# Patient Record
Sex: Male | Born: 1980 | Race: Black or African American | Hispanic: No | Marital: Single | Smoking: Current every day smoker
Health system: Southern US, Community
[De-identification: ages and names within clinical notes are randomized; demographics above are authoritative.]

## PROBLEM LIST (undated history)

## (undated) DIAGNOSIS — I10 Essential (primary) hypertension: Secondary | ICD-10-CM

---

## 2000-06-16 ENCOUNTER — Emergency Department (HOSPITAL_COMMUNITY): Admission: EM | Admit: 2000-06-16 | Discharge: 2000-06-16 | Payer: Self-pay | Admitting: Emergency Medicine

## 2001-10-16 ENCOUNTER — Emergency Department (HOSPITAL_COMMUNITY): Admission: EM | Admit: 2001-10-16 | Discharge: 2001-10-16 | Payer: Self-pay | Admitting: *Deleted

## 2003-06-03 ENCOUNTER — Emergency Department (HOSPITAL_COMMUNITY): Admission: EM | Admit: 2003-06-03 | Discharge: 2003-06-03 | Payer: Self-pay | Admitting: Emergency Medicine

## 2005-01-09 ENCOUNTER — Emergency Department (HOSPITAL_COMMUNITY): Admission: EM | Admit: 2005-01-09 | Discharge: 2005-01-09 | Payer: Self-pay | Admitting: Family Medicine

## 2006-10-16 ENCOUNTER — Emergency Department (HOSPITAL_COMMUNITY): Admission: EM | Admit: 2006-10-16 | Discharge: 2006-10-16 | Payer: Self-pay | Admitting: Emergency Medicine

## 2007-08-17 ENCOUNTER — Emergency Department (HOSPITAL_COMMUNITY): Admission: EM | Admit: 2007-08-17 | Discharge: 2007-08-17 | Payer: Self-pay | Admitting: Emergency Medicine

## 2008-03-06 ENCOUNTER — Emergency Department (HOSPITAL_COMMUNITY): Admission: EM | Admit: 2008-03-06 | Discharge: 2008-03-06 | Payer: Self-pay | Admitting: Emergency Medicine

## 2009-06-21 ENCOUNTER — Emergency Department (HOSPITAL_COMMUNITY): Admission: EM | Admit: 2009-06-21 | Discharge: 2009-06-21 | Payer: Self-pay | Admitting: Emergency Medicine

## 2010-11-29 ENCOUNTER — Encounter: Payer: Self-pay | Admitting: Family Medicine

## 2011-01-10 ENCOUNTER — Emergency Department (HOSPITAL_COMMUNITY): Payer: Self-pay

## 2011-01-10 ENCOUNTER — Emergency Department (HOSPITAL_COMMUNITY)
Admission: EM | Admit: 2011-01-10 | Discharge: 2011-01-10 | Disposition: A | Discharge status: Home / Self Care | Payer: No Typology Code available for payment source | Attending: Emergency Medicine | Admitting: Emergency Medicine

## 2011-01-10 DIAGNOSIS — S81009A Unspecified open wound, unspecified knee, initial encounter: Secondary | ICD-10-CM | POA: Insufficient documentation

## 2011-01-10 DIAGNOSIS — IMO0002 Reserved for concepts with insufficient information to code with codable children: Secondary | ICD-10-CM | POA: Insufficient documentation

## 2011-01-10 DIAGNOSIS — R51 Headache: Secondary | ICD-10-CM | POA: Insufficient documentation

## 2011-01-10 DIAGNOSIS — S060X0A Concussion without loss of consciousness, initial encounter: Secondary | ICD-10-CM | POA: Insufficient documentation

## 2011-01-21 ENCOUNTER — Emergency Department (HOSPITAL_COMMUNITY): Payer: Self-pay

## 2011-01-21 ENCOUNTER — Emergency Department (HOSPITAL_COMMUNITY)
Admission: EM | Admit: 2011-01-21 | Discharge: 2011-01-21 | Disposition: A | Discharge status: Home / Self Care | Payer: Self-pay | Attending: Emergency Medicine | Admitting: Emergency Medicine

## 2011-01-21 ENCOUNTER — Inpatient Hospital Stay (INDEPENDENT_AMBULATORY_CARE_PROVIDER_SITE_OTHER)
Admission: RE | Admit: 2011-01-21 | Discharge: 2011-01-21 | Disposition: A | Discharge status: Home / Self Care | Payer: No Typology Code available for payment source | Source: Ambulatory Visit

## 2011-01-21 DIAGNOSIS — R059 Cough, unspecified: Secondary | ICD-10-CM | POA: Insufficient documentation

## 2011-01-21 DIAGNOSIS — M7989 Other specified soft tissue disorders: Secondary | ICD-10-CM

## 2011-01-21 DIAGNOSIS — R609 Edema, unspecified: Secondary | ICD-10-CM | POA: Insufficient documentation

## 2011-01-21 DIAGNOSIS — M25476 Effusion, unspecified foot: Secondary | ICD-10-CM | POA: Insufficient documentation

## 2011-01-21 DIAGNOSIS — R0989 Other specified symptoms and signs involving the circulatory and respiratory systems: Secondary | ICD-10-CM | POA: Insufficient documentation

## 2011-01-21 DIAGNOSIS — L02419 Cutaneous abscess of limb, unspecified: Secondary | ICD-10-CM | POA: Insufficient documentation

## 2011-01-21 DIAGNOSIS — M25579 Pain in unspecified ankle and joints of unspecified foot: Secondary | ICD-10-CM | POA: Insufficient documentation

## 2011-01-21 DIAGNOSIS — R05 Cough: Secondary | ICD-10-CM | POA: Insufficient documentation

## 2011-01-21 DIAGNOSIS — M25473 Effusion, unspecified ankle: Secondary | ICD-10-CM | POA: Insufficient documentation

## 2011-01-21 DIAGNOSIS — M79609 Pain in unspecified limb: Secondary | ICD-10-CM | POA: Insufficient documentation

## 2011-02-06 ENCOUNTER — Emergency Department (HOSPITAL_COMMUNITY)
Admission: EM | Admit: 2011-02-06 | Discharge: 2011-02-07 | Disposition: A | Discharge status: Home / Self Care | Payer: No Typology Code available for payment source | Attending: Emergency Medicine | Admitting: Emergency Medicine

## 2011-02-06 DIAGNOSIS — M79609 Pain in unspecified limb: Secondary | ICD-10-CM | POA: Insufficient documentation

## 2011-02-06 DIAGNOSIS — R609 Edema, unspecified: Secondary | ICD-10-CM | POA: Insufficient documentation

## 2011-02-06 DIAGNOSIS — M7989 Other specified soft tissue disorders: Secondary | ICD-10-CM | POA: Insufficient documentation

## 2011-02-06 DIAGNOSIS — L989 Disorder of the skin and subcutaneous tissue, unspecified: Secondary | ICD-10-CM | POA: Insufficient documentation

## 2011-02-07 ENCOUNTER — Ambulatory Visit (HOSPITAL_COMMUNITY)
Admission: RE | Admit: 2011-02-07 | Discharge: 2011-02-07 | Disposition: A | Discharge status: Home / Self Care | Payer: No Typology Code available for payment source | Source: Intra-hospital | Attending: Emergency Medicine | Admitting: Emergency Medicine

## 2011-02-07 DIAGNOSIS — M79609 Pain in unspecified limb: Secondary | ICD-10-CM | POA: Insufficient documentation

## 2011-02-07 DIAGNOSIS — M7989 Other specified soft tissue disorders: Secondary | ICD-10-CM | POA: Insufficient documentation

## 2011-02-07 LAB — DIFFERENTIAL
Basophils Relative: 0 % (ref 0–1)
Eosinophils Absolute: 0.2 10*3/uL (ref 0.0–0.7)
Lymphs Abs: 2.3 10*3/uL (ref 0.7–4.0)
Monocytes Absolute: 0.6 10*3/uL (ref 0.1–1.0)
Monocytes Relative: 10 % (ref 3–12)

## 2011-02-07 LAB — CBC
Hemoglobin: 12.6 g/dL — ABNORMAL LOW (ref 13.0–17.0)
MCH: 30 pg (ref 26.0–34.0)
MCHC: 32.7 g/dL (ref 30.0–36.0)
MCV: 91.7 fL (ref 78.0–100.0)
Platelets: 215 10*3/uL (ref 150–400)

## 2011-02-13 LAB — CBC
HCT: 39.9 % (ref 39.0–52.0)
MCHC: 33.8 g/dL (ref 30.0–36.0)
Platelets: 164 10*3/uL (ref 150–400)
RDW: 15.4 % (ref 11.5–15.5)

## 2011-02-13 LAB — POCT I-STAT, CHEM 8
BUN: 6 mg/dL (ref 6–23)
Calcium, Ion: 1.09 mmol/L — ABNORMAL LOW (ref 1.12–1.32)
HCT: 42 % (ref 39.0–52.0)
Hemoglobin: 14.3 g/dL (ref 13.0–17.0)
Sodium: 138 mEq/L (ref 135–145)
TCO2: 25 mmol/L (ref 0–100)

## 2011-02-13 LAB — DIFFERENTIAL
Basophils Absolute: 0.1 10*3/uL (ref 0.0–0.1)
Basophils Relative: 1 % (ref 0–1)
Eosinophils Relative: 1 % (ref 0–5)
Lymphocytes Relative: 8 % — ABNORMAL LOW (ref 12–46)
Monocytes Absolute: 1 10*3/uL (ref 0.1–1.0)

## 2011-02-13 LAB — RAPID STREP SCREEN (MED CTR MEBANE ONLY): Streptococcus, Group A Screen (Direct): NEGATIVE

## 2011-08-19 LAB — CULTURE, ROUTINE-ABSCESS: Gram Stain: NONE SEEN

## 2011-08-30 ENCOUNTER — Inpatient Hospital Stay (INDEPENDENT_AMBULATORY_CARE_PROVIDER_SITE_OTHER)
Admission: RE | Admit: 2011-08-30 | Discharge: 2011-08-30 | Disposition: A | Discharge status: Home / Self Care | Payer: No Typology Code available for payment source | Source: Ambulatory Visit | Attending: Family Medicine | Admitting: Family Medicine

## 2011-08-30 DIAGNOSIS — M545 Low back pain: Secondary | ICD-10-CM

## 2011-08-30 DIAGNOSIS — R030 Elevated blood-pressure reading, without diagnosis of hypertension: Secondary | ICD-10-CM
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
## 2015-03-19 ENCOUNTER — Encounter (HOSPITAL_COMMUNITY): Payer: Self-pay

## 2015-03-19 ENCOUNTER — Emergency Department (INDEPENDENT_AMBULATORY_CARE_PROVIDER_SITE_OTHER)
Admission: EM | Admit: 2015-03-19 | Discharge: 2015-03-19 | Disposition: A | Discharge status: Home / Self Care | Payer: Self-pay | Source: Home / Self Care | Attending: Family Medicine | Admitting: Family Medicine

## 2015-03-19 DIAGNOSIS — M6740 Ganglion, unspecified site: Secondary | ICD-10-CM

## 2015-03-19 DIAGNOSIS — I10 Essential (primary) hypertension: Secondary | ICD-10-CM

## 2015-03-19 DIAGNOSIS — L739 Follicular disorder, unspecified: Secondary | ICD-10-CM

## 2015-03-19 DIAGNOSIS — R600 Localized edema: Secondary | ICD-10-CM

## 2015-03-19 LAB — POCT I-STAT, CHEM 8
BUN: 17 mg/dL (ref 6–20)
CREATININE: 1 mg/dL (ref 0.61–1.24)
Calcium, Ion: 1.17 mmol/L (ref 1.12–1.23)
Chloride: 105 mmol/L (ref 101–111)
Glucose, Bld: 139 mg/dL — ABNORMAL HIGH (ref 70–99)
HEMATOCRIT: 47 % (ref 39.0–52.0)
HEMOGLOBIN: 16 g/dL (ref 13.0–17.0)
POTASSIUM: 4 mmol/L (ref 3.5–5.1)
SODIUM: 140 mmol/L (ref 135–145)
TCO2: 21 mmol/L (ref 0–100)

## 2015-03-19 MED ORDER — METHYLPREDNISOLONE ACETATE 40 MG/ML IJ SUSP
INTRAMUSCULAR | Status: AC
  Filled 2015-03-19: qty 1

## 2015-03-19 MED ORDER — SULFAMETHOXAZOLE-TRIMETHOPRIM 800-160 MG PO TABS: 2.0000 | ORAL_TABLET | Freq: Two times a day (BID) | ORAL | Status: DC

## 2015-03-19 MED ORDER — HYDROCHLOROTHIAZIDE 25 MG PO TABS: 25.0000 mg | ORAL_TABLET | Freq: Every day | ORAL | Status: DC

## 2015-03-19 MED ORDER — METHYLPREDNISOLONE ACETATE 40 MG/ML IJ SUSP
40.0000 mg | Freq: Once | INTRAMUSCULAR | Status: AC
  Administered 2015-03-19: 40 mg via INTRA_ARTICULAR

## 2015-03-19 NOTE — ED Notes (Signed)
Pain and swelling left wrist, pain & swelling right posterior auricular area, feet painful and swollen

## 2015-03-19 NOTE — ED Provider Notes (Signed)
Gerald Mills is a 34 y.o. male who presents to Urgent Care today for several issues. Patient does not have a doctor or health insurance and recently started a new job. He is stated to get health insurance in the near future.  1) left wrist swelling. Patient notes swelling at the dorsal radial wrist worse with activity and better with rest. No radiating pain weakness or numbness. No injury. 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  2) feet swelling bilaterally present for months. No chest pains or palpitations. The swelling is worse after the end of the day standing. He does note some shortness of breath when he lies flat at night. He feels well otherwise. He notes uncontrolled hypertension as well.  3) pain at the right ear. Patient has pain at the right ear fold posteriorly with chronic drainage and infection. This is been present for months. No treatment tried yet. No fevers or chills.  4) hypertension. Patient has a personal family history of hypertension with no fevers or chills. No chest pains or palpitations.     History reviewed. No pertinent past medical history. History reviewed. No pertinent past surgical history. History  Substance Use Topics  . Smoking status: Current Every Day Smoker  . Smokeless tobacco: Not on file  . Alcohol Use: No   ROS as above Medications: No current facility-administered medications for this encounter.   Current Outpatient Prescriptions  Medication Sig Dispense Refill  . Aspirin-Salicylamide-Caffeine (BC HEADACHE POWDER PO) Take by mouth.    . hydrochlorothiazide (HYDRODIURIL) 25 MG tablet Take 1 tablet (25 mg total) by mouth daily. 30 tablet 1  . sulfamethoxazole-trimethoprim (BACTRIM DS,SEPTRA DS) 800-160 MG per tablet Take 2 tablets by mouth 2 (two) times daily. 28 tablet 0   No Known Allergies   Exam:  BP 164/102 mmHg  Pulse 70  Temp(Src) 98.1 F (36.7 C) (Oral)  Resp 16  SpO2 97% Gen: Well NAD obese HEENT:  EOMI,  MMM right posterior ear fold erythematous and tender. No fluctuance palpated. No JVD Lungs: Normal work of breathing. CTABL Heart: RRR no MRG Abd: NABS, Soft. Nondistended, Nontender Exts: Brisk capillary refill, warm and well perfused.  Left wrist mass at the dorsal radial wrist nontender slightly mobile. Wrist motion is intact grip strength capillary refill and sensation are intact. Feet bilaterally have trace edema. Pulses are intact bilateral feet  Limited musculoskeletal ultrasound of the left dorsal wrist. Fluid-filled cyst approximately 5 mm in depth and 2 cm in length visible consistent with ganglion cyst. Origin seems to be the fourth dorsal wrist compartment.  Aspiration and injection of ganglion cyst:  Consent obtained and timeout performed. Discussed risks of infection damage to nearby structures bleeding and skin hypopigmentation. Skin cleaned with Betadine and cold spray applied and a half a milliliter of lidocaine without epinephrine injected in a wheel overlying the cyst to achieve good anesthesia. 18-gauge needle inserted into the cyst and a vacuum was placed on a 20-milliliter syringe. No gelatinous material was expressible. 25-gauge needle was used to access the cyst and 20 mg of Depo-Medrol and 0.5 mL of lidocaine were injected. Patient tolerated the procedure well   ED ECG REPORT   Date: 03/19/2015  Rate: 62  Rhythm: normal sinus rhythm  QRS Axis: normal  Intervals: normal  ST/T Wave abnormalities: nonspecific T wave changes and Lateral flat T waves  Conduction Disutrbances:none  Narrative Interpretation:   Old EKG Reviewed: none available  I have personally reviewed the EKG tracing and  agree with the computerized printout as noted.    Results for orders placed or performed during the hospital encounter of 03/19/15 (from the past 24 hour(s))  I-STAT, chem 8     Status: Abnormal   Collection Time: 03/19/15  2:32 PM  Result Value Ref Range   Sodium 140  135 - 145 mmol/L   Potassium 4.0 3.5 - 5.1 mmol/L   Chloride 105 101 - 111 mmol/L   BUN 17 6 - 20 mg/dL   Creatinine, Ser 1.00 0.61 - 1.24 mg/dL   Glucose, Bld 139 (H) 70 - 99 mg/dL   Calcium, Ion 1.17 1.12 - 1.23 mmol/L   TCO2 21 0 - 100 mmol/L   Hemoglobin 16.0 13.0 - 17.0 g/dL   HCT 47.0 39.0 - 52.0 %   No results found.  Assessment and Plan: 34 y.o. male with  1) ganglion cyst left hand: Treat with corticosteroid injection and wrist bracing as above. Return as needed 2) pedal edema likely related to diastolic dysfunction or fluid retention. Refer to cardiology for workup of heart. Start hydrochlorothiazide. 3) Folliculitis: Treat with bactrim 4) HTN: HCTZ  Discussed warning signs or symptoms. Please see discharge instructions. Patient expresses understanding.     Gregor Hams, MD 03/19/15 [phone]

## 2015-03-19 NOTE — Discharge Instructions (Signed)
Thank you for coming in today. Start hydrochlorothiazide daily for both foot swelling and high blood pressure. Take the Bactrim for the ear skin infection Follow-up with the heart doctors for your heart Return as needed   Edema Edema is an abnormal buildup of fluids in your bodytissues. Edema is somewhatdependent on gravity to pull the fluid to the lowest place in your body. That makes the condition more common in the legs and thighs (lower extremities). Painless swelling of the feet and ankles is common and becomes more likely as you get older. It is also common in looser tissues, like around your eyes.  When the affected area is squeezed, the fluid may move out of that spot and leave a dent for a few moments. This dent is called pitting.  CAUSES  There are many possible causes of edema. Eating too much salt and being on your feet or sitting for a long time can cause edema in your legs and ankles. Hot weather may make edema worse. Common medical causes of edema include:  Heart failure.  Liver disease.  Kidney disease.  Weak blood vessels in your legs.  Cancer.  An injury.  Pregnancy.  Some medications.  Obesity. SYMPTOMS  Edema is usually painless.Your skin may look swollen or shiny.  DIAGNOSIS  Your health care provider may be able to diagnose edema by asking about your medical history and doing a physical exam. You may need to have tests such as X-rays, an electrocardiogram, or blood tests to check for medical conditions that may cause edema.  TREATMENT  Edema treatment depends on the cause. If you have heart, liver, or kidney disease, you need the treatment appropriate for these conditions. General treatment may include:  Elevation of the affected body part above the level of your heart.  Compression of the affected body part. Pressure from elastic bandages or support stockings squeezes the tissues and forces fluid back into the blood vessels. This keeps fluid from  entering the tissues.  Restriction of fluid and salt intake.  Use of a water pill (diuretic). These medications are appropriate only for some types of edema. They pull fluid out of your body and make you urinate more often. This gets rid of fluid and reduces swelling, but diuretics can have side effects. Only use diuretics as directed by your health care provider. HOME CARE INSTRUCTIONS   Keep the affected body part above the level of your heart when you are lying down.   Do not sit still or stand for prolonged periods.   Do not put anything directly under your knees when lying down.  Do not wear constricting clothing or garters on your upper legs.   Exercise your legs to work the fluid back into your blood vessels. This may help the swelling go down.   Wear elastic bandages or support stockings to reduce ankle swelling as directed by your health care provider.   Eat a low-salt diet to reduce fluid if your health care provider recommends it.   Only take medicines as directed by your health care provider. SEEK MEDICAL CARE IF:   Your edema is not responding to treatment.  You have heart, liver, or kidney disease and notice symptoms of edema.  You have edema in your legs that does not improve after elevating them.   You have sudden and unexplained weight gain. SEEK IMMEDIATE MEDICAL CARE IF:   You develop shortness of breath or chest pain.   You cannot breathe when you lie down.  You develop pain, redness, or warmth in the swollen areas.   You have heart, liver, or kidney disease and suddenly get edema.  You have a fever and your symptoms suddenly get worse. MAKE SURE YOU:   Understand these instructions.  Will watch your condition.  Will get help right away if you are not doing well or get worse. Document Released: 10/25/2005 Document Revised: 03/11/2014 Document Reviewed: 08/17/2013 [facility] Patient Information 2015 [place], [place]. This information is not  intended to replace advice given to you by your health care provider. Make sure you discuss any questions you have with your health care provider.   Hypertension Hypertension, commonly called high blood pressure, is when the force of blood pumping through your arteries is too strong. Your arteries are the blood vessels that carry blood from your heart throughout your body. A blood pressure reading consists of a higher number over a lower number, such as 110/72. The higher number (systolic) is the pressure inside your arteries when your heart pumps. The lower number (diastolic) is the pressure inside your arteries when your heart relaxes. Ideally you want your blood pressure below 120/80. Hypertension forces your heart to work harder to pump blood. Your arteries may become narrow or stiff. Having hypertension puts you at risk for heart disease, stroke, and other problems.  RISK FACTORS Some risk factors for high blood pressure are controllable. Others are not.  Risk factors you cannot control include:   Race. You may be at higher risk if you are African American.  Age. Risk increases with age.  Gender. Men are at higher risk than women before age 49 years. After age 69, women are at higher risk than men. Risk factors you can control include:  Not getting enough exercise or physical activity.  Being overweight.  Getting too much fat, sugar, calories, or salt in your diet.  Drinking too much alcohol. SIGNS AND SYMPTOMS Hypertension does not usually cause signs or symptoms. Extremely high blood pressure (hypertensive crisis) may cause headache, anxiety, shortness of breath, and nosebleed. DIAGNOSIS  To check if you have hypertension, your health care provider will measure your blood pressure while you are seated, with your arm held at the level of your heart. It should be measured at least twice using the same arm. Certain conditions can cause a difference in blood pressure between your right  and left arms. A blood pressure reading that is higher than normal on one occasion does not mean that you need treatment. If one blood pressure reading is high, ask your health care provider about having it checked again. TREATMENT  Treating high blood pressure includes making lifestyle changes and possibly taking medicine. Living a healthy lifestyle can help lower high blood pressure. You may need to change some of your habits. Lifestyle changes may include:  Following the DASH diet. This diet is high in fruits, vegetables, and whole grains. It is low in salt, red meat, and added sugars.  Getting at least 2 hours of brisk physical activity every week.  Losing weight if necessary.  Not smoking.  Limiting alcoholic beverages.  Learning ways to reduce stress. If lifestyle changes are not enough to get your blood pressure under control, your health care provider may prescribe medicine. You may need to take more than one. Work closely with your health care provider to understand the risks and benefits. HOME CARE INSTRUCTIONS  Have your blood pressure rechecked as directed by your health care provider.   Take medicines only as  directed by your health care provider. Follow the directions carefully. Blood pressure medicines must be taken as prescribed. The medicine does not work as well when you skip doses. Skipping doses also puts you at risk for problems.   Do not smoke.   Monitor your blood pressure at home as directed by your health care provider. SEEK MEDICAL CARE IF:   You think you are having a reaction to medicines taken.  You have recurrent headaches or feel dizzy.  You have swelling in your ankles.  You have trouble with your vision. SEEK IMMEDIATE MEDICAL CARE IF:  You develop a severe headache or confusion.  You have unusual weakness, numbness, or feel faint.  You have severe chest or abdominal pain.  You vomit repeatedly.  You have trouble breathing. MAKE  SURE YOU:   Understand these instructions.  Will watch your condition.  Will get help right away if you are not doing well or get worse. Document Released: 10/25/2005 Document Revised: 03/11/2014 Document Reviewed: 08/17/2013 [facility] Patient Information 2015 [place], [place]. This information is not intended to replace advice given to you by your health care provider. Make sure you discuss any questions you have with your health care provider.   Ganglion Cyst A ganglion cyst is a noncancerous, fluid-filled lump that occurs near joints or tendons. The ganglion cyst grows out of a joint or the lining of a tendon. It most often develops in the hand or wrist but can also develop in the shoulder, elbow, hip, knee, ankle, or foot. The round or oval ganglion can be pea sized or larger than a grape. Increased activity may enlarge the size of the cyst because more fluid starts to build up.  CAUSES  It is not completely known what causes a ganglion cyst to grow. However, it may be related to:  Inflammation or irritation around the joint.  An injury.  Repetitive movements or overuse.  Arthritis. SYMPTOMS  A lump most often appears in the hand or wrist, but can occur in other areas of the body. Generally, the lump is painless without other symptoms. However, sometimes pain can be felt during activity or when pressure is applied to the lump. The lump may even be tender to the touch. Tingling, pain, numbness, or muscle weakness can occur if the ganglion cyst presses on a nerve. Your grip may be weak and you may have less movement in your joints.  DIAGNOSIS  Ganglion cysts are most often diagnosed based on a physical exam, noting where the cyst is and how it looks. Your caregiver will feel the lump and may shine a light alongside it. If it is a ganglion, a light often shines through it. Your caregiver may order an X-ray, ultrasound, or MRI to rule out other conditions. TREATMENT  Ganglions usually go  away on their own without treatment. If pain or other symptoms are involved, treatment may be needed. Treatment is also needed if the ganglion limits your movement or if it gets infected. Treatment options include:  Wearing a wrist or finger brace or splint.  Taking anti-inflammatory medicine.  Draining fluid from the lump with a needle (aspiration).  Injecting a steroid into the joint.  Surgery to remove the ganglion cyst and its stalk that is attached to the joint or tendon. However, ganglion cysts can grow back. HOME CARE INSTRUCTIONS   Do not press on the ganglion, poke it with a needle, or hit it with a heavy object. You may rub the lump gently and  often. Sometimes fluid moves out of the cyst.  Only take medicines as directed by your caregiver.  Wear your brace or splint as directed by your caregiver. SEEK MEDICAL CARE IF:   Your ganglion becomes larger or more painful.  You have increased redness, red streaks, or swelling.  You have pus coming from the lump.  You have weakness or numbness in the affected area. MAKE SURE YOU:   Understand these instructions.  Will watch your condition.  Will get help right away if you are not doing well or get worse. Document Released: 10/22/2000 Document Revised: 07/19/2012 Document Reviewed: 12/19/2007 [facility] Patient Information 2015 [place], [place]. This information is not intended to replace advice given to you by your health care provider. Make sure you discuss any questions you have with your health care provider.

## 2015-12-22 ENCOUNTER — Encounter (HOSPITAL_COMMUNITY): Payer: Self-pay | Admitting: *Deleted

## 2015-12-22 ENCOUNTER — Emergency Department (INDEPENDENT_AMBULATORY_CARE_PROVIDER_SITE_OTHER)
Admission: EM | Admit: 2015-12-22 | Discharge: 2015-12-22 | Disposition: A | Discharge status: Home / Self Care | Payer: Self-pay | Source: Home / Self Care | Attending: Emergency Medicine | Admitting: Emergency Medicine

## 2015-12-22 DIAGNOSIS — I10 Essential (primary) hypertension: Secondary | ICD-10-CM

## 2015-12-22 DIAGNOSIS — M67432 Ganglion, left wrist: Secondary | ICD-10-CM

## 2015-12-22 DIAGNOSIS — M25562 Pain in left knee: Secondary | ICD-10-CM

## 2015-12-22 DIAGNOSIS — L0293 Carbuncle, unspecified: Secondary | ICD-10-CM

## 2015-12-22 DIAGNOSIS — R600 Localized edema: Secondary | ICD-10-CM

## 2015-12-22 HISTORY — DX: Essential (primary) hypertension: I10

## 2015-12-22 LAB — POCT URINALYSIS DIP (DEVICE)
BILIRUBIN URINE: NEGATIVE
GLUCOSE, UA: NEGATIVE mg/dL
Ketones, ur: NEGATIVE mg/dL
NITRITE: NEGATIVE
PH: 6.5 (ref 5.0–8.0)
PROTEIN: NEGATIVE mg/dL
Specific Gravity, Urine: 1.02 (ref 1.005–1.030)
Urobilinogen, UA: 0.2 mg/dL (ref 0.0–1.0)

## 2015-12-22 LAB — POCT I-STAT, CHEM 8
BUN: 19 mg/dL (ref 6–20)
CALCIUM ION: 1.16 mmol/L (ref 1.12–1.23)
CHLORIDE: 104 mmol/L (ref 101–111)
Creatinine, Ser: 1 mg/dL (ref 0.61–1.24)
Glucose, Bld: 119 mg/dL — ABNORMAL HIGH (ref 65–99)
HCT: 46 % (ref 39.0–52.0)
Hemoglobin: 15.6 g/dL (ref 13.0–17.0)
Potassium: 3.9 mmol/L (ref 3.5–5.1)
SODIUM: 143 mmol/L (ref 135–145)
TCO2: 29 mmol/L (ref 0–100)

## 2015-12-22 MED ORDER — MELOXICAM 15 MG PO TABS: 15.0000 mg | ORAL_TABLET | Freq: Every day | ORAL | Status: DC

## 2015-12-22 MED ORDER — LISINOPRIL-HYDROCHLOROTHIAZIDE 10-12.5 MG PO TABS: 1.0000 | ORAL_TABLET | Freq: Every day | ORAL | Status: DC

## 2015-12-22 MED ORDER — SULFAMETHOXAZOLE-TRIMETHOPRIM 800-160 MG PO TABS: 1.0000 | ORAL_TABLET | Freq: Two times a day (BID) | ORAL | Status: DC

## 2015-12-22 NOTE — ED Notes (Addendum)
Feet  And  Hands   Are  Swelling          X   1  Year     Also  Has  Knot  l  Wrist    painfull  To  Touch  Boil  Behind  r  Ear

## 2015-12-22 NOTE — ED Provider Notes (Signed)
CSN: [id number]     Arrival date & time 12/22/15  58 History   First MD Initiated Contact with Patient 12/22/15 1734     Chief Complaint  Patient presents with  . Joint Swelling   (Consider location/radiation/quality/duration/timing/severity/associated sxs/prior Treatment) HPI  He is a 35 year old man here for evaluation of edema.  He reports swelling in his hands and feet for the last year, but worse over the last several months. It does get better with elevation. He reports swelling to the point where it causes discomfort. He denies any chest pain or shortness of breath. No palpitations. He does have high blood pressure. He has not been on medication for at least 6 months. He does not have any health insurance.  He also reports a cyst on his left wrist. It can create some discomfort at work. He works in a Personal [employer]. He states someone tried to drain it before, but didn't get anything out of it.  He also reports some pressure in his left knee. No injury or trauma. He is able to walk without difficulty. He states it feels swollen.  Past Medical History  Diagnosis Date  . Hypertension    History reviewed. No pertinent past surgical history. History reviewed. No pertinent family history. Social History  Substance Use Topics  . Smoking status: Current Every Day Smoker  . Smokeless tobacco: None  . Alcohol Use: No    Review of Systems As in history of present illness Allergies  Review of patient's allergies indicates no known allergies.  Home Medications   Prior to Admission medications   Medication Sig Start Date End Date Taking? Authorizing Provider  lisinopril-hydrochlorothiazide (PRINZIDE,ZESTORETIC) 10-12.5 MG tablet Take 1 tablet by mouth daily. 12/22/15   Melony Overly, MD  meloxicam (MOBIC) 15 MG tablet Take 1 tablet (15 mg total) by mouth daily. 12/22/15   Melony Overly, MD  sulfamethoxazole-trimethoprim (BACTRIM DS,SEPTRA DS) 800-160 MG tablet Take 1 tablet by mouth 2  (two) times daily. 12/22/15   Melony Overly, MD   Meds Ordered and Administered this Visit  Medications - No data to display  BP 172/109 mmHg  Pulse 82  Temp(Src) 98.1 F (36.7 C) (Oral)  Resp 16  SpO2 99% No data found.   Physical Exam  Constitutional: He is oriented to person, place, and time. He appears well-developed and well-nourished. No distress.  Cardiovascular: Normal rate.   Pulmonary/Chest: Effort normal.  Musculoskeletal: He exhibits edema (2+ to midshin bilaterally).  Left wrist: He has a dorsal ganglion cyst. There is no erythema. It is minimally tender. Left knee: He does have some swelling of the left knee. No point tenderness. He does appear to have a small joint effusion. No joint laxity.  Neurological: He is alert and oriented to person, place, and time.    ED Course  Procedures (including critical care time)  Labs Review Labs Reviewed  POCT URINALYSIS DIP (DEVICE) - Abnormal; Notable for the following:    Hgb urine dipstick TRACE (*)    Leukocytes, UA SMALL (*)    All other components within normal limits  POCT I-STAT, CHEM 8 - Abnormal; Notable for the following:    Glucose, Bld 119 (*)    All other components within normal limits    Imaging Review No results found.    MDM   1. Essential hypertension   2. Pedal edema   3. Ganglion cyst of wrist, left   4. Recurrent boils   5. Left knee pain  His swelling is likely coming from uncontrolled hypertension. I started him on lisinopril/HCTZ. He is to come back in one week to recheck his blood work and blood pressure. I suspect we will have to increase the dose of this medicine. We'll treat recurring boil with Bactrim. Wrist brace and meloxicam for his ganglion cyst. Meloxicam for the left knee pain.    Melony Overly, MD 12/22/15 [phone]

## 2015-12-22 NOTE — Discharge Instructions (Signed)
Take Bactrim 1 pill twice a day for 7 days to see this will clear up the recurring boils behind your ear. Take meloxicam daily for the next week, then as needed. This is for your knee pain and the wrist. With a wrist brace while you are work for the next week. The swelling in your legs is coming from your blood pressure. Take lisinopril-hydrochlorothiazide daily. Please come back in one week so we can recheck your blood work and your blood pressure.

## 2017-04-04 ENCOUNTER — Ambulatory Visit (HOSPITAL_COMMUNITY)
Admission: EM | Admit: 2017-04-04 | Discharge: 2017-04-04 | Disposition: A | Discharge status: Home / Self Care | Payer: Self-pay | Attending: Family Medicine | Admitting: Family Medicine

## 2017-04-04 ENCOUNTER — Encounter (HOSPITAL_COMMUNITY): Payer: Self-pay | Admitting: Emergency Medicine

## 2017-04-04 DIAGNOSIS — I10 Essential (primary) hypertension: Secondary | ICD-10-CM

## 2017-04-04 DIAGNOSIS — K047 Periapical abscess without sinus: Secondary | ICD-10-CM

## 2017-04-04 DIAGNOSIS — Z202 Contact with and (suspected) exposure to infections with a predominantly sexual mode of transmission: Secondary | ICD-10-CM | POA: Insufficient documentation

## 2017-04-04 DIAGNOSIS — R1030 Lower abdominal pain, unspecified: Secondary | ICD-10-CM

## 2017-04-04 DIAGNOSIS — R369 Urethral discharge, unspecified: Secondary | ICD-10-CM

## 2017-04-04 LAB — POCT URINALYSIS DIP (DEVICE)
Bilirubin Urine: NEGATIVE
GLUCOSE, UA: NEGATIVE mg/dL
Hgb urine dipstick: NEGATIVE
KETONES UR: NEGATIVE mg/dL
LEUKOCYTES UA: NEGATIVE
Nitrite: NEGATIVE
Protein, ur: 100 mg/dL — AB
SPECIFIC GRAVITY, URINE: 1.025 (ref 1.005–1.030)
Urobilinogen, UA: 0.2 mg/dL (ref 0.0–1.0)
pH: 5.5 (ref 5.0–8.0)

## 2017-04-04 MED ORDER — HYDROCHLOROTHIAZIDE 25 MG PO TABS: 25.0000 mg | ORAL_TABLET | Freq: Every day | ORAL | 1 refills | Status: DC

## 2017-04-04 MED ORDER — AZITHROMYCIN 250 MG PO TABS
1000.0000 mg | ORAL_TABLET | Freq: Once | ORAL | Status: AC
  Administered 2017-04-04: 1000 mg via ORAL

## 2017-04-04 MED ORDER — LIDOCAINE HCL (PF) 1 % IJ SOLN
INTRAMUSCULAR | Status: AC
  Filled 2017-04-04: qty 2

## 2017-04-04 MED ORDER — CEFTRIAXONE SODIUM 1 G IJ SOLR
1.0000 g | Freq: Once | INTRAMUSCULAR | Status: AC
  Administered 2017-04-04: 1 g via INTRAMUSCULAR

## 2017-04-04 MED ORDER — CEFTRIAXONE SODIUM 1 G IJ SOLR
INTRAMUSCULAR | Status: AC
  Filled 2017-04-04: qty 10

## 2017-04-04 MED ORDER — AMOXICILLIN 500 MG PO CAPS: 500.0000 mg | ORAL_CAPSULE | Freq: Three times a day (TID) | ORAL | 0 refills | Status: DC

## 2017-04-04 MED ORDER — AZITHROMYCIN 250 MG PO TABS
ORAL_TABLET | ORAL | Status: AC
  Filled 2017-04-04: qty 4

## 2017-04-04 NOTE — ED Provider Notes (Signed)
CSN: [id number]     Arrival date & time 04/04/17  1910 History   First MD Initiated Contact with Patient 04/04/17 1950     Chief Complaint  Patient presents with  . Exposure to STD   (Consider location/radiation/quality/duration/timing/severity/associated sxs/prior Treatment) Here for multiple complaints. He was told by a male that she tested postiive for STI after they had unprotected intercourse approx 3 days ago. C/o penial discharge white and lower abd pain. Denies any n/v/d.  Also c/o rt tooth pain states that he has had several tooth abscess in the past and has not been able to see a dentist. Has not taken anything pta. Also bp elevated and was given an rx for htn but has not been able to afford this. Denies any facial droop, no weakness. Alert x4. asking for a cheaper rx till he can see a pcp      Past Medical History:  Diagnosis Date  . Hypertension    History reviewed. No pertinent surgical history. No family history on file. Social History  Substance Use Topics  . Smoking status: Current Every Day Smoker  . Smokeless tobacco: Not on file  . Alcohol use No    Review of Systems  Constitutional: Negative.   HENT: Positive for dental problem, ear pain and facial swelling.        Rt side tooth pain and swelling   Eyes: Negative.   Respiratory: Negative.   Cardiovascular: Negative.   Gastrointestinal: Positive for abdominal pain.  Genitourinary: Positive for discharge and frequency.  Neurological: Positive for headaches.       Intermit with rt side tooth pain     Allergies  Patient has no known allergies.  Home Medications   Prior to Admission medications   Medication Sig Start Date End Date Taking? Authorizing Provider  amoxicillin (AMOXIL) 500 MG capsule Take 1 capsule (500 mg total) by mouth 3 (three) times daily. 04/04/17   Melanee Left, NP  lisinopril-hydrochlorothiazide (PRINZIDE,ZESTORETIC) 10-12.5 MG tablet Take 1 tablet by mouth daily. 12/22/15    Melony Overly, MD  meloxicam (MOBIC) 15 MG tablet Take 1 tablet (15 mg total) by mouth daily. 12/22/15   Melony Overly, MD  sulfamethoxazole-trimethoprim (BACTRIM DS,SEPTRA DS) 800-160 MG tablet Take 1 tablet by mouth 2 (two) times daily. 12/22/15   Melony Overly, MD   Meds Ordered and Administered this Visit   Medications  cefTRIAXone (ROCEPHIN) injection 1 g (not administered)  azithromycin (ZITHROMAX) tablet 1,000 mg (not administered)    BP (!) 199/104 (BP Location: Right Arm) Comment: large cuff  Pulse 67   Temp 98.1 F (36.7 C) (Oral)   Resp (!) 22   SpO2 100%  No data found.   Physical Exam  Constitutional: He appears well-developed.  HENT:  Right Ear: External ear normal.  Left Ear: External ear normal.  Mouth/Throat: Oropharynx is clear and moist.  Rt side tooth decay   Eyes: Pupils are equal, round, and reactive to light.  Neck: Normal range of motion.  Cardiovascular: Normal rate.   Pulmonary/Chest: Effort normal and breath sounds normal.  Abdominal: Soft.  Lower abd tenderness.   Genitourinary:  Genitourinary Comments: White thick discharge   Neurological: He is alert.  Skin: Skin is warm.    Urgent Care Course     Procedures (including critical care time)  Labs Review Labs Reviewed  POCT URINALYSIS DIP (DEVICE) - Abnormal; Notable for the following:       Result Value   Protein,  ur 100 (*)    All other components within normal limits  URINE CYTOLOGY ANCILLARY ONLY    Imaging Review No results found.        MDM   1. STD exposure   2. Essential hypertension   3. Dental abscess    Educated about blood pressure and the signs of stroke  Discussed the exposure to sti  Will need to see a dental for broken tooth  Discussed multiple different reasons for bp issues  And how to control with diet and exercise.  Reviewed previous chart and rx.     Melanee Left, NP 04/04/17 2012

## 2017-04-04 NOTE — ED Triage Notes (Signed)
Exposure to chlamydia.  No pain with urination.  Patient has a discharge.    Right ear pain/dental pain and headache

## 2017-04-04 NOTE — Discharge Instructions (Signed)
Educated about blood pressure and the signs of stroke  Discussed the exposure to sti  Will need to see a dental for broken tooth

## 2017-04-05 LAB — URINE CYTOLOGY ANCILLARY ONLY
CHLAMYDIA, DNA PROBE: NEGATIVE
NEISSERIA GONORRHEA: NEGATIVE
Trichomonas: NEGATIVE

## 2017-04-09 ENCOUNTER — Encounter (HOSPITAL_COMMUNITY): Payer: Self-pay | Admitting: *Deleted

## 2017-04-09 ENCOUNTER — Ambulatory Visit (HOSPITAL_COMMUNITY)
Admission: EM | Admit: 2017-04-09 | Discharge: 2017-04-09 | Disposition: A | Discharge status: Home / Self Care | Payer: Self-pay | Attending: Internal Medicine | Admitting: Internal Medicine

## 2017-04-09 DIAGNOSIS — I10 Essential (primary) hypertension: Secondary | ICD-10-CM | POA: Insufficient documentation

## 2017-04-09 DIAGNOSIS — F17200 Nicotine dependence, unspecified, uncomplicated: Secondary | ICD-10-CM | POA: Insufficient documentation

## 2017-04-09 DIAGNOSIS — A64 Unspecified sexually transmitted disease: Secondary | ICD-10-CM | POA: Insufficient documentation

## 2017-04-09 NOTE — ED Notes (Signed)
Urine 1 and urine 2 placed in lab

## 2017-04-09 NOTE — ED Triage Notes (Signed)
Pt was seen 5/28; was treated for STD with azithro and ceftriaxone; also taking amoxicillin for dental infection.  States discharge was improving but started worsening again yesterday.  Also started the new HTN med.  C/O HA but denies fevers.

## 2017-04-09 NOTE — ED Provider Notes (Signed)
CSN: [id number]     Arrival date & time 04/09/17  1206 History   None    Chief Complaint  Patient presents with  . Penile Discharge   (Consider location/radiation/quality/duration/timing/severity/associated sxs/prior Treatment) 36y.o. male presents with penile discharge. Patient was seen at this facility for similar complaints on 5.28.18. Patient states that his has lower back pain, lower abdominal and pain and that he has white discharge that he notes after he urinates. Patient denies any urinary pain, testicular pain, or fevers.  Condition is acute in nature. Condition is made better by antibiotics give at this facility temperarily. Condition is made worse by nothing. Patient states that only one time since he was treated for STI for which he was negative did he feel that his symptoms were not presents. Patient describes the symptoms are worse after masturbation. Patient states that he has not had sexual intercourse since he was treated. Patient declines second dose of antibiotics at this time.       Past Medical History:  Diagnosis Date  . Hypertension    History reviewed. No pertinent surgical history. No family history on file. Social History  Substance Use Topics  . Smoking status: Current Every Day Smoker  . Smokeless tobacco: Not on file  . Alcohol use No    Review of Systems  Constitutional: Negative for chills and fever.  HENT: Negative for ear pain and sore throat.   Eyes: Negative for pain and visual disturbance.  Respiratory: Negative for cough and shortness of breath.   Cardiovascular: Negative for chest pain and palpitations.  Gastrointestinal: Positive for abdominal pain ( tightness diffuse lower). Negative for vomiting.  Genitourinary: Positive for discharge ( white). Negative for decreased urine volume, dysuria, hematuria, penile pain, penile swelling, scrotal swelling, testicular pain and urgency.  Musculoskeletal: Negative for arthralgias and back pain ( right  lower back ).  Skin: Negative for color change and rash.  Neurological: Negative for seizures and syncope.  All other systems reviewed and are negative.   Allergies  Patient has no known allergies.  Home Medications   Prior to Admission medications   Medication Sig Start Date End Date Taking? Authorizing Provider  amoxicillin (AMOXIL) 500 MG capsule Take 1 capsule (500 mg total) by mouth 3 (three) times daily. 04/04/17  Yes Morley Kos A, NP  hydrochlorothiazide (HYDRODIURIL) 25 MG tablet Take 1 tablet (25 mg total) by mouth daily. 04/04/17  Yes Morley Kos A, NP  lisinopril-hydrochlorothiazide (PRINZIDE,ZESTORETIC) 10-12.5 MG tablet Take 1 tablet by mouth daily. 12/22/15   Melony Overly, MD  meloxicam (MOBIC) 15 MG tablet Take 1 tablet (15 mg total) by mouth daily. 12/22/15   Melony Overly, MD  sulfamethoxazole-trimethoprim (BACTRIM DS,SEPTRA DS) 800-160 MG tablet Take 1 tablet by mouth 2 (two) times daily. 12/22/15   Melony Overly, MD   Meds Ordered and Administered this Visit  Medications - No data to display  BP (!) 179/110   Pulse 66   Temp 98.3 F (36.8 C) (Oral)   Resp 16   SpO2 100%  No data found.   Physical Exam  Constitutional: He is oriented to person, place, and time. He appears well-developed and well-nourished.  HENT:  Head: Normocephalic.  Neck: Normal range of motion.  Pulmonary/Chest: Effort normal.  Musculoskeletal: Normal range of motion.  Neurological: He is alert and oriented to person, place, and time.  Skin: Skin is dry.  Psychiatric: He has a normal mood and affect.  Nursing note and vitals reviewed.  Urgent Care Course     Procedures (including critical care time)  Labs Review Labs Reviewed  CYTOLOGY, (ORAL, ANAL, URETHRAL) ANCILLARY ONLY    Imaging Review No results found.       MDM   1. STD (male)        Jacqualine Mau, NP 04/09/17 1331

## 2017-04-11 LAB — URINE CYTOLOGY ANCILLARY ONLY
Chlamydia: NEGATIVE
NEISSERIA GONORRHEA: NEGATIVE
TRICH (WINDOWPATH): NEGATIVE

## 2017-04-12 LAB — URINE CYTOLOGY ANCILLARY ONLY
BACTERIAL VAGINITIS: NEGATIVE
CANDIDA VAGINITIS: NEGATIVE

## 2017-04-20 ENCOUNTER — Emergency Department (HOSPITAL_COMMUNITY)
Admission: EM | Admit: 2017-04-20 | Discharge: 2017-04-20 | Disposition: A | Discharge status: Home / Self Care | Payer: Self-pay | Attending: Emergency Medicine | Admitting: Emergency Medicine

## 2017-04-20 ENCOUNTER — Encounter (HOSPITAL_COMMUNITY): Payer: Self-pay | Admitting: *Deleted

## 2017-04-20 DIAGNOSIS — Z202 Contact with and (suspected) exposure to infections with a predominantly sexual mode of transmission: Secondary | ICD-10-CM | POA: Insufficient documentation

## 2017-04-20 DIAGNOSIS — M545 Low back pain, unspecified: Secondary | ICD-10-CM

## 2017-04-20 DIAGNOSIS — I10 Essential (primary) hypertension: Secondary | ICD-10-CM | POA: Insufficient documentation

## 2017-04-20 MED ORDER — AZITHROMYCIN 250 MG PO TABS
1000.0000 mg | ORAL_TABLET | Freq: Once | ORAL | Status: AC
  Administered 2017-04-20: 1000 mg via ORAL
  Filled 2017-04-20: qty 4

## 2017-04-20 MED ORDER — CEFTRIAXONE SODIUM 250 MG IJ SOLR
250.0000 mg | Freq: Once | INTRAMUSCULAR | Status: AC
  Administered 2017-04-20: 250 mg via INTRAMUSCULAR
  Filled 2017-04-20: qty 250

## 2017-04-20 MED ORDER — DICLOFENAC SODIUM 1 % TD GEL: 2.0000 g | Freq: Four times a day (QID) | TRANSDERMAL | 0 refills | Status: DC

## 2017-04-20 MED ORDER — LIDOCAINE HCL (PF) 1 % IJ SOLN
2.0000 mL | Freq: Once | INTRAMUSCULAR | Status: AC
  Administered 2017-04-20: 1.8 mL
  Filled 2017-04-20: qty 5

## 2017-04-20 MED ORDER — STERILE WATER FOR INJECTION IJ SOLN
0.9000 mL | Freq: Once | INTRAMUSCULAR | Status: DC
  Filled 2017-04-20: qty 10

## 2017-04-20 NOTE — ED Provider Notes (Signed)
[facility] DEPT Provider Note   CSN: [id number] Arrival date & time: 04/20/17  1438     History   Chief Complaint Chief Complaint  Patient presents with  . Back Pain  . SEXUALLY TRANSMITTED DISEASE    HPI Gerald Mills is a 36 y.o. male with a history of hypertension and STD exposure who presents to the emergency department for penile discharge and low back pain.  Patient was seen on 04/04/2017 for STD exposure where he was treated with Rocephin and Zithromax in office. The patient admits to having sexual intercourse that was unprotected, last week with the same person that gave him the STD originally. He does not think she has been treated.  Over the last week he has been been having a clear/white discharge from his penis. He denies any penile pain, scrotal pain, testicular pain, lesions, rash, itching, abdominal pain, nausea, vomiting, diarrhea.   Patient also notes over the last 4 days she has developed gradual onset of right lower back pain. 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  He states it is a dull ache is rated 4/10 that is only felt if he bends down or twists to his side. He is not currently resting comfortably and is not in any back pain. He has been taking BC powder for this with relief. The patient denies fever, chills, trauma, numbness/tingling/weakness of the lower extremities, saddle anesthesia, loss of bowel or bladder function, urinary retention, use of IV drugs, or skin changes.  HPI  Past Medical History:  Diagnosis Date  . Hypertension     There are no active problems to display for this patient.   History reviewed. No pertinent surgical history.     Home Medications    Prior to Admission medications   Medication Sig Start Date End Date Taking? Authorizing Provider  amoxicillin (AMOXIL) 500 MG capsule Take 1 capsule (500 mg total) by mouth 3 (three) times daily. 04/04/17    Melanee Left, NP  diclofenac sodium (VOLTAREN) 1 % GEL Apply 2 g topically 4 (four) times daily. 04/20/17   Donavon Kimrey, Barth Kirks, PA-C  hydrochlorothiazide (HYDRODIURIL) 25 MG tablet Take 1 tablet (25 mg total) by mouth daily. 04/04/17   Melanee Left, NP  lisinopril-hydrochlorothiazide (PRINZIDE,ZESTORETIC) 10-12.5 MG tablet Take 1 tablet by mouth daily. 12/22/15   Melony Overly, MD  meloxicam (MOBIC) 15 MG tablet Take 1 tablet (15 mg total) by mouth daily. 12/22/15   Melony Overly, MD  sulfamethoxazole-trimethoprim (BACTRIM DS,SEPTRA DS) 800-160 MG tablet Take 1 tablet by mouth 2 (two) times daily. 12/22/15   Melony Overly, MD    Family History History reviewed. No pertinent family history.  Social History Social History  Substance Use Topics  . Smoking status: Current Every Day Smoker  . Smokeless tobacco: Not on file  . Alcohol use No     Allergies   Patient has no known allergies.   Review of Systems Review of Systems  All other systems reviewed and are negative.    Physical Exam Updated Vital Signs BP (!) 177/115 (BP Location: Right Arm)   Pulse 98   Temp 98 F (36.7 C) (Oral)   Resp 16   SpO2 100%   Physical Exam  Constitutional: He appears well-developed and well-nourished.  Overweight male. No acute distess. Non-toxic.   HENT:  Head: Normocephalic and atraumatic.  Right Ear: External ear normal.  Left Ear: External ear normal.  Nose: Nose normal.  Eyes: Conjunctivae are normal. Right eye exhibits no discharge. Left eye exhibits no discharge. No scleral icterus.  Pulmonary/Chest: Effort normal. No respiratory distress.  Abdominal: Soft. There is no tenderness. There is no CVA tenderness. Hernia confirmed negative in the right inguinal area and confirmed negative in the left inguinal area.  Genitourinary: Testes normal and penis normal. Cremasteric reflex is present. Right testis shows no mass, no swelling and no tenderness. Left testis shows no mass, no  swelling and no tenderness. Circumcised. No phimosis, paraphimosis, hypospadias, penile erythema or penile tenderness. No discharge found.  Musculoskeletal:       Right hip: Normal.       Left hip: Normal.       Cervical back: Normal.       Thoracic back: Normal.       Lumbar back: He exhibits tenderness. He exhibits normal range of motion.  Low back: range of motion full and intact. He notes pain when performing flexion and extension. He notes tenderness over the right lower back. No bony tenderness. Gait able. Deep tendon reflexes 2+ patellar. Sensation intact to light touch distally. Neurovascularly intact distally.   Lymphadenopathy: No inguinal adenopathy noted on the right or left side.  Neurological: He is alert. He has normal strength. No sensory deficit. Gait normal.  Skin: Skin is warm and dry. No rash noted. No pallor.  Psychiatric: He has a normal mood and affect.  Nursing note and vitals reviewed.    ED Treatments / Results  Labs (all labs ordered are listed, but only abnormal results are displayed) Labs Reviewed  GC/CHLAMYDIA PROBE AMP ([place]) NOT AT [facility]    EKG  EKG Interpretation None       Radiology No results found.  Procedures Procedures (including critical care time)  Medications Ordered in ED Medications  cefTRIAXone (ROCEPHIN) injection 250 mg (250 mg Intramuscular Given 04/20/17 1658)  azithromycin (ZITHROMAX) tablet 1,000 mg (1,000 mg Oral Given 04/20/17 1629)  lidocaine (PF) (XYLOCAINE) 1 % injection 2 mL (1.8 mLs Other Given 04/20/17 1659)     Initial Impression / Assessment and Plan / ED Course  I have reviewed the triage vital signs and the nursing notes.  Pertinent labs & imaging results that were available during my care of the patient were reviewed by me and considered in my medical decision making (see chart for details).     This is a 36 year old male who presents today for repeat STD exposure. I will prophylactically treat the  patient with ceftriaxone and azithromycin. We'll obtain a gonorrhea and chlamydia screen. I offered Syphilis and HIV testing for the patient but he denies at this time. Advised the patient to follow-up with the health department. Also advised the abstain from sexual intercourse till all partners are treated and seen at the health department.   Suspect lower back pain is due to muscular skeletal in nature. There is no history of trauma or red flag symptoms. He is non-toxic appearing on presentation without a fever. There is tenderness on exam to the right lower back. He has full ROM and nuerovascularly intact distally. The patient has normal and able gait. No imaging warranted at this time. Will give the patient Voltaren gel, and back exercises. Advised the patient he can take Motrin and Tylenol alternately for pain.  Advised the patient to follow with PCP in the next 24 hours for persistent symptoms and also to discuss elevated blood pressure that  was present during today's visit. Patient states understanding and is in agreement with plan. Return precatuations given. All questions answered.  Final Clinical Impressions(s) / ED Diagnoses   Final diagnoses:  Acute right-sided low back pain without sciatica  STD exposure  Essential hypertension    New Prescriptions Discharge Medication List as of 04/20/2017  4:15 PM    START taking these medications   Details  diclofenac sodium (VOLTAREN) 1 % GEL Apply 2 g topically 4 (four) times daily., Starting Wed 04/20/2017, Print         Jillyn Ledger, PA-C 04/20/17 Wanda, Nathan, MD 04/21/17 [phone]

## 2017-04-20 NOTE — ED Triage Notes (Signed)
Pt reports lower back pain and scrotum pain, possible penile discharge. Was exposed to std and requests to be checked.

## 2017-04-20 NOTE — Discharge Instructions (Signed)
I have treated you for your STD exposure. Your results of your STD testing will not back today. Please call the health department for follow-up. The number is attached. Please advise your partners you are in sexual contact with to be tested. Do not have sexual contact until you or they are tested.   I am giving you a gel for your back pain. You can apply this up to 4x/daily. For pain control you may take:  800mg  of ibuprofen (that is usually 4 over the counter pills)  3 times a day (take with food) and acetaminophen 975mg  (this is 3 over the counter pills) four times a day. Do not drink alcohol or combine with other medications that have acetaminophen as an ingredient (Read the labels!).  Please do not exceed this regimen >1 week. Do not combine with BC powder. I am giving you back exercises to do at home.  Please follow up with your PCP. If you don't have a PCP you can call the number below. Please discuss you blood pressure during this visit as well as your blood pressure was elevated during todays visit. Please discuss persistent symptoms with your PCP.   You can return to the emergency department for worsening or new concerning symptoms.     If you have no primary doctor, here are some resources that may be helpful:  [place] Providers:   - [facility]- [address]      [phone]      Mon-Fri 9am-7pm, Sat 9am-1pm   - [facility], [place]      [place], [address] [place]      [facility]- [address]      [place], [address]      [phone]      Only accepts [place] [facility] [place] patients       after they have her name applied to their card   Self Pay (no insurance) in [place]:   - Sickle Cell Patients: Dr Kevan Ny, [facility]      [place]      [place]   - Physician Referral Service- [phone]   - [facility]- [facility]- [address], [facility]- see information above      (Speak to [employer] if you do not have insurance)   - [facility]- [place]      [place]      [phone]   - [facility]- [address]      [phone]   - Dr [facility] Lawman-  [address], [address], [facility]- [address]      [phone]   - [facility]- [address]      [phone]      Also [address]      [phone]   - [facility]- [address]  [phone]      1st and 3rd Saturday every month, 10am-1pm Other agencies that provide inexpensive medical care:    [facility]  [phone]    [facility]  [phone]    [facility]  [phone]    [facility]  [phone]    [facility]  [phone]  General Information: Finding a doctor when you do not have health insurance can be tricky. Although you are not limited by an insurance plan, you are of course limited by her finances and how much but he can pay out of pocket.  What are your options if you don't have health insurance?   1) Find a Tax adviser and Pay Out of Pocket Although you won't have to find out who is covered by your insurance plan, it is a good idea to ask around and get recommendations. You will then need to call the office and see if the doctor you have chosen will accept you as a new patient and what types of options they offer for patients who are self-pay. Some doctors offer discounts or will set up payment plans for their patients who do not have insurance, but you will need to ask so you aren't surprised when you get to your  appointment.  2) Contact Your Local Health Department Not all health departments have doctors that can see patients for sick visits, but many do, so it is worth a call to see if yours does. If you don't know where your local health department is, you can check in your phone book. The CDC also has a tool to help you locate your state's health department, and many state websites also have listings of all of their local health departments.  3) Find a [facility] If your illness is not likely to be very severe or complicated, you may want to try a walk in clinic. These are popping up all over the country in pharmacies, drugstores, and shopping centers. They're usually staffed by nurse practitioners or physician assistants that have been trained to treat common illnesses and complaints. They're usually fairly quick and inexpensive. However, if you have serious medical issues or chronic medical problems, these are probably not your best option

## 2017-06-15 ENCOUNTER — Encounter (HOSPITAL_COMMUNITY): Payer: Self-pay | Admitting: Family Medicine

## 2017-06-15 ENCOUNTER — Ambulatory Visit (HOSPITAL_COMMUNITY)
Admission: EM | Admit: 2017-06-15 | Discharge: 2017-06-15 | Disposition: A | Discharge status: Home / Self Care | Payer: Self-pay | Attending: Family Medicine | Admitting: Family Medicine

## 2017-06-15 DIAGNOSIS — K0889 Other specified disorders of teeth and supporting structures: Secondary | ICD-10-CM

## 2017-06-15 DIAGNOSIS — I10 Essential (primary) hypertension: Secondary | ICD-10-CM

## 2017-06-15 MED ORDER — AMOXICILLIN-POT CLAVULANATE 875-125 MG PO TABS: 1.0000 | ORAL_TABLET | Freq: Two times a day (BID) | ORAL | 0 refills | Status: DC

## 2017-06-15 MED ORDER — LISINOPRIL-HYDROCHLOROTHIAZIDE 10-12.5 MG PO TABS: 1.0000 | ORAL_TABLET | Freq: Every day | ORAL | 2 refills | Status: DC

## 2017-06-15 NOTE — ED Triage Notes (Signed)
Pt here for right jaw pain, ear pain, mouth pain. sts that he also had an abscess behind right ear.

## 2017-06-18 NOTE — ED Provider Notes (Signed)
  [facility]   [id number] 06/15/17 Arrival Time: 4142  ASSESSMENT & PLAN:  1. Pain, dental   2. Essential hypertension     Meds ordered this encounter  Medications  . amoxicillin-clavulanate (AUGMENTIN) 875-125 MG tablet    Sig: Take 1 tablet by mouth every 12 (twelve) hours.    Dispense:  14 tablet    Refill:  0  . lisinopril-hydrochlorothiazide (PRINZIDE,ZESTORETIC) 10-12.5 MG tablet    Sig: Take 1 tablet by mouth daily.    Dispense:  30 tablet    Refill:  2   Will start Augmentin and f/u with dentist. Also begin HTN treatment. Plans to est care with PCP. Reviewed expectations re: course of current medical issues. Questions answered. Outlined signs and symptoms indicating need for more acute intervention. Patient verbalized understanding. After Visit Summary given.   SUBJECTIVE:  Gerald Mills is a 36 y.o. male who presents with complaint of R-sided dental pain radiating to his R ear for a few days. No hearing changes. Afebrile. Similar pain in same location in the past. Cannot remember how long ago. Tolerating PO intake but chewing on R makes pain worse. No dental care sought. No drainage from gums. OTC analgesics with mild help.  Notice increased BP in office.  ROS: As per HPI.   OBJECTIVE:  Vitals:   06/15/17 1801  BP: (!) 188/120  Pulse: 62  Resp: 18  Temp: 98.6 F (37 C)  TempSrc: Oral  SpO2: 100%     General appearance: alert; no distress HEENT: overall poor dentition; R-sided upper gum tenderness; no fluctuance Neck: supple Skin: warm and dry Psychological:  alert and cooperative; normal mood and affect  No Known Allergies  PMHx, SurgHx, SocialHx, Medications, and Allergies were reviewed in the Visit Navigator and updated as appropriate.      Vanessa Kick, MD 06/18/17 1151

## 2017-10-29 ENCOUNTER — Other Ambulatory Visit: Payer: Self-pay

## 2017-10-29 ENCOUNTER — Encounter (HOSPITAL_COMMUNITY): Payer: Self-pay | Admitting: Emergency Medicine

## 2017-10-29 ENCOUNTER — Ambulatory Visit (HOSPITAL_COMMUNITY)
Admission: EM | Admit: 2017-10-29 | Discharge: 2017-10-29 | Disposition: A | Discharge status: Home / Self Care | Payer: Self-pay | Attending: Family Medicine | Admitting: Family Medicine

## 2017-10-29 DIAGNOSIS — H6011 Cellulitis of right external ear: Secondary | ICD-10-CM

## 2017-10-29 DIAGNOSIS — F410 Panic disorder [episodic paroxysmal anxiety] without agoraphobia: Secondary | ICD-10-CM

## 2017-10-29 MED ORDER — DOXYCYCLINE HYCLATE 100 MG PO TABS: 100.0000 mg | ORAL_TABLET | Freq: Two times a day (BID) | ORAL | 3 refills | Status: DC

## 2017-10-29 MED ORDER — ESCITALOPRAM OXALATE 10 MG PO TABS: 10.0000 mg | ORAL_TABLET | Freq: Every day | ORAL | 3 refills | Status: DC

## 2017-10-29 NOTE — Discharge Instructions (Signed)
Continue to monitor blood pressure.  See your primary care doctor if it remains elevated (above 14/90)  Consult a psychologist like Dr. Hulen Skains for the panic disorder

## 2017-10-29 NOTE — ED Provider Notes (Signed)
[place]   [id number] 10/29/17 Arrival Time: 1416   SUBJECTIVE:  Gerald Mills is a 36 y.o. male who presents to the urgent care with complaint of abscess behind his right ear that has been reappearing over the last month.  This is a recurring problem.  Assoc mild acne  He also reports panic attacks over the last few months.  He reports breaking out in a sweat and feeling like he wants "to jump outside of his body."  Recent new job in a warehouse.  Has had some high blood pressure recordings.  Patient is a bit of a "recluse."  Gets nervous around others.  Taking BC's for headaches.    Past Medical History:  Diagnosis Date  . Hypertension    History reviewed. No pertinent family history. Social History   Socioeconomic History  . Marital status: Single    Spouse name: Not on file  . Number of children: Not on file  . Years of education: Not on file  . Highest education level: Not on file  Social Needs  . Financial resource strain: Not on file  . Food insecurity - worry: Not on file  . Food insecurity - inability: Not on file  . Transportation needs - medical: Not on file  . Transportation needs - non-medical: Not on file  Occupational History  . Not on file  Tobacco Use  . Smoking status: Current Every Day Smoker    Packs/day: 0.50    Types: Cigarettes  . Smokeless tobacco: Never Used  Substance and Sexual Activity  . Alcohol use: No  . Drug use: No  . Sexual activity: Not on file  Other Topics Concern  . Not on file  Social History Narrative  . Not on file   No outpatient medications have been marked as taking for the 10/29/17 encounter [facility] Encounter).   No Known Allergies    ROS: As per HPI, remainder of ROS negative.   OBJECTIVE:   Vitals:   10/29/17 1450  BP: (!) 189/104  Pulse: 65  Temp: 97.7 F (36.5 C)  TempSrc: Oral  SpO2: 99%     General appearance: alert; no distress Eyes: PERRL; EOMI; conjunctiva normal HENT:  normocephalic; atraumatic; TMs normal, canal normal, external ears mild right ear lobe erythema, swelling and tenderness without trauma; nasal mucosa normal; oral mucosa normal Neck: supple Back: no CVA tenderness Extremities: no cyanosis or edema; symmetrical with no gross deformities Skin: warm and dry Neurologic: normal gait; grossly normal Psychological: alert and cooperative; normal mood and affect      Labs:  Results for orders placed or performed during the hospital encounter of 04/09/17  Urine cytology ancillary only  Result Value Ref Range   Chlamydia Negative    Neisseria gonorrhea Negative    Trichomonas Negative   Urine cytology ancillary only  Result Value Ref Range   Bacterial vaginitis Negative for Bacterial Vaginitis Microorganisms    Candida vaginitis Negative for Candida Vaginitis Microorganisms     Labs Reviewed - No data to display  No results found.     ASSESSMENT & PLAN:  1. Cellulitis of right earlobe   2. Panic disorder     Meds ordered this encounter  Medications  . doxycycline (VIBRA-TABS) 100 MG tablet    Sig: Take 1 tablet (100 mg total) by mouth 2 (two) times daily.    Dispense:  14 tablet    Refill:  3  . escitalopram (LEXAPRO) 10 MG tablet  Sig: Take 1 tablet (10 mg total) by mouth daily.    Dispense:  90 tablet    Refill:  3    Reviewed expectations re: course of current medical issues. Questions answered. Outlined signs and symptoms indicating need for more acute intervention. Patient verbalized understanding. After Visit Summary given.    Procedures:      Robyn Haber, MD 10/29/17 [phone]

## 2017-10-29 NOTE — ED Triage Notes (Signed)
Pt reports an abscess behind his right ear that has been reappearing over the last month.  He also reports panic attacks over the last few months.  He reports breaking out in a sweat and feeling like he wants "to jump outside of his body."

## 2017-11-17 ENCOUNTER — Ambulatory Visit (INDEPENDENT_AMBULATORY_CARE_PROVIDER_SITE_OTHER): Payer: Self-pay | Admitting: Physician Assistant

## 2018-02-27 ENCOUNTER — Ambulatory Visit (HOSPITAL_COMMUNITY)
Admission: EM | Admit: 2018-02-27 | Discharge: 2018-02-27 | Disposition: A | Discharge status: Home / Self Care | Payer: Self-pay | Attending: Family Medicine | Admitting: Family Medicine

## 2018-02-27 ENCOUNTER — Encounter (HOSPITAL_COMMUNITY): Payer: Self-pay | Admitting: Family Medicine

## 2018-02-27 DIAGNOSIS — L700 Acne vulgaris: Secondary | ICD-10-CM

## 2018-02-27 DIAGNOSIS — L97929 Non-pressure chronic ulcer of unspecified part of left lower leg with unspecified severity: Secondary | ICD-10-CM

## 2018-02-27 DIAGNOSIS — L97919 Non-pressure chronic ulcer of unspecified part of right lower leg with unspecified severity: Secondary | ICD-10-CM

## 2018-02-27 DIAGNOSIS — Z760 Encounter for issue of repeat prescription: Secondary | ICD-10-CM

## 2018-02-27 DIAGNOSIS — L97921 Non-pressure chronic ulcer of unspecified part of left lower leg limited to breakdown of skin: Secondary | ICD-10-CM

## 2018-02-27 DIAGNOSIS — I10 Essential (primary) hypertension: Secondary | ICD-10-CM

## 2018-02-27 MED ORDER — BENZOYL PEROXIDE 5 % EX LIQD: Freq: Two times a day (BID) | CUTANEOUS | 1 refills | Status: DC

## 2018-02-27 MED ORDER — LISINOPRIL-HYDROCHLOROTHIAZIDE 10-12.5 MG PO TABS: 1.0000 | ORAL_TABLET | Freq: Every day | ORAL | 1 refills | Status: DC

## 2018-02-27 NOTE — ED Triage Notes (Signed)
Pt here for med refill on HTN meds. He also has ulcers in legs that wont heal. They flare up and then heal but always return.

## 2018-02-27 NOTE — Discharge Instructions (Addendum)
Take lisinopril-HCTZ as prescribed Use topical medication as needed for mild-moderate acne Referral to wound clinic for further evaluation and treatment of lower leg ulcers Establish care with PCP for further evaluation and treatment of hypertension Return here or go to ER if you have any new or worsening symptoms such as headache, dizziness, blurred vision, chest pain, shortness of breath, difficulty breathing, changes in bowel or bladder habits, etc..Marland Kitchen

## 2018-02-27 NOTE — ED Provider Notes (Signed)
[name]    CSN: [id number] Arrival date & time: 02/27/18  1344     History   Chief Complaint Chief Complaint  Patient presents with  . Medication Refill    HPI Gerald Mills is a 37 y.o. male.   Patient complains of recurring "holes in legs."  It began about year ago.  States he begn after hitting his shins on planks at work.  He localizes his wounds to bilateral shinsright is worse than left.  He describes them as painful and weeping.  He has tried peroxide and neosporin with temporary improvement.  He denies aggravating factors.  He reports similar symptoms in the past that improved with.    Patient is also here for lisinopril HCTZ refill.  He reports a history of HTN.  Blood pressure elevated in office today.  He reports mild headache associated with elevated blood pressure.  Denies chest pain, SOB, dizziness, vision changes, changes in bowel or bladder habits.       Past Medical History:  Diagnosis Date  . Hypertension     There are no active problems to display for this patient.   History reviewed. No pertinent surgical history.     Home Medications    Prior to Admission medications   Medication Sig Start Date End Date Taking? Authorizing Provider  benzoyl peroxide 5 % external liquid Apply topically 2 (two) times daily. 02/27/18   Faiz Weber, [place], PA-C  diclofenac sodium (VOLTAREN) 1 % GEL Apply 2 g topically 4 (four) times daily. 04/20/17   Maczis, Barth Kirks, PA-C  doxycycline (VIBRA-TABS) 100 MG tablet Take 1 tablet (100 mg total) by mouth 2 (two) times daily. 10/29/17   Robyn Haber, MD  escitalopram (LEXAPRO) 10 MG tablet Take 1 tablet (10 mg total) by mouth daily. 10/29/17   Robyn Haber, MD  hydrochlorothiazide (HYDRODIURIL) 25 MG tablet Take 1 tablet (25 mg total) by mouth daily. 04/04/17   Marney Setting, NP  lisinopril-hydrochlorothiazide (PRINZIDE,ZESTORETIC) 10-12.5 MG tablet Take 1 tablet by mouth daily. 02/27/18   Lestine Box, PA-C    Family History History reviewed. No pertinent family history.  Social History Social History   Tobacco Use  . Smoking status: Current Every Day Smoker    Packs/day: 0.50    Types: Cigarettes  . Smokeless tobacco: Never Used  Substance Use Topics  . Alcohol use: No  . Drug use: No     Allergies   Patient has no known allergies.   Review of Systems Review of Systems  Constitutional: Negative for chills, fatigue and fever.  HENT: Negative for congestion, sinus pressure, sinus pain, sneezing and sore throat.   Eyes: Negative for visual disturbance.  Respiratory: Negative for cough and shortness of breath.   Cardiovascular: Negative for chest pain.  Gastrointestinal: Negative for abdominal pain, constipation, diarrhea, nausea and vomiting.  Genitourinary: Negative for difficulty urinating.  Skin: Positive for wound.  Neurological: Positive for dizziness. Negative for headaches.     Physical Exam Triage Vital Signs ED Triage Vitals  Enc Vitals Group     BP 02/27/18 1429 (!) 189/110     Pulse Rate 02/27/18 1429 (!) 58     Resp 02/27/18 1429 18     Temp 02/27/18 1429 98.1 F (36.7 C)     Temp src --      SpO2 02/27/18 1429 100 %     Weight --      Height --      Head Circumference --  Peak Flow --      Pain Score 02/27/18 1427 4     Pain Loc --      Pain Edu? --      Excl. in [place]? --    No data found.  Updated Vital Signs BP (!) 189/110   Pulse (!) 58   Temp 98.1 F (36.7 C)   Resp 18   SpO2 100%   Visual Acuity Right Eye Distance:   Left Eye Distance:   Bilateral Distance:    Right Eye Near:   Left Eye Near:    Bilateral Near:     Physical Exam  Constitutional: He is oriented to person, place, and time. He appears well-developed and well-nourished. No distress.  HENT:  Head: Normocephalic and atraumatic.  Right Ear: External ear normal.  Left Ear: External ear normal.  Nose: Nose normal.  Mouth/Throat: Oropharynx is  clear and moist. No oropharyngeal exudate.  Eyes: Pupils are equal, round, and reactive to light. Conjunctivae and EOM are normal.  Neck: Normal range of motion. Neck supple.  Cardiovascular: Normal rate, regular rhythm and normal heart sounds. Exam reveals no friction rub.  No murmur heard. Pedis dorsalis pulse 2+ and intact bilaterally Radial pulse 2+ and intact bilaterally  Pulmonary/Chest: Effort normal and breath sounds normal. No respiratory distress. He has no wheezes. He has no rales.  Abdominal: Soft. Bowel sounds are normal. There is no tenderness. There is no guarding.  Musculoskeletal: He exhibits edema.  Ambulates from chair to exam table without difficulty  Mild edema of the bilateral lower extremities.   Lymphadenopathy:    He has no cervical adenopathy.  Neurological: He is alert and oriented to person, place, and time.  Skin: Skin is warm and dry. Capillary refill takes 2 to 3 seconds. He is not diaphoretic. No erythema.  Mild to moderate amount of pustules and comedones diffuse about the forehead, bilateral cheeks, and neck.     Round <1cm "punched-out" circular lesion on the right anterior lateral tibia without surrounding erythema, warmth or drainage.    Healing round <1cm "punched-out" circular lesion on the left anterior lateral tibia without surrounding erythema, warmth or drainage.    Psychiatric: He has a normal mood and affect. His behavior is normal. Judgment and thought content normal.     UC Treatments / Results  Labs (all labs ordered are listed, but only abnormal results are displayed) Labs Reviewed - No data to display  EKG None Radiology No results found.  Procedures Procedures (including critical care time)  Medications Ordered in UC Medications - No data to display   Initial Impression / Assessment and Plan / UC Course  I have reviewed the triage vital signs and the nursing notes.  Pertinent labs & imaging results that were available  during my care of the patient were reviewed by me and considered in my medical decision making (see chart for details).    Patient presents today with multiple complaints.   1. Uncontrolled hypertension: Lisinopril-HCTZ refilled.  Patient will begin recording blood pressure at home and will establish care with PCP.  Reviewed diet and exercise modifications with patient 2. Chronic lower extremity ulcers: Referral to wound clinic for further evaluation and treatment.  Ulcer on right tibia did not appear infected and patient did not show signs or symptoms of systemic infection.  He will continue with compression stockings and keep ulcers clean and dry.   3.  Mild-moderate acne: Benzoyl peroxide topical cream prescribed.  Patient will use  as needed for pustules and comedones   Return and ER precautions given.  Final Clinical Impressions(s) / UC Diagnoses   Final diagnoses:  Essential hypertension, benign  Chronic ulcer of left leg, limited to breakdown of skin ([place])  Acne vulgaris    ED Discharge Orders        Ordered    lisinopril-hydrochlorothiazide (PRINZIDE,ZESTORETIC) 10-12.5 MG tablet  Daily     02/27/18 1515    benzoyl peroxide 5 % external liquid  2 times daily     02/27/18 1522       Controlled Substance Prescriptions [place] Controlled Substance Registry consulted? Not Applicable   Lestine Box, [place] 02/27/18 1550

## 2018-07-19 ENCOUNTER — Ambulatory Visit (HOSPITAL_COMMUNITY)
Admission: EM | Admit: 2018-07-19 | Discharge: 2018-07-19 | Disposition: A | Discharge status: Home / Self Care | Payer: BLUE CROSS/BLUE SHIELD | Attending: Family Medicine | Admitting: Family Medicine

## 2018-07-19 ENCOUNTER — Encounter (HOSPITAL_COMMUNITY): Payer: Self-pay | Admitting: Emergency Medicine

## 2018-07-19 ENCOUNTER — Other Ambulatory Visit: Payer: Self-pay

## 2018-07-19 DIAGNOSIS — B353 Tinea pedis: Secondary | ICD-10-CM | POA: Diagnosis not present

## 2018-07-19 DIAGNOSIS — B351 Tinea unguium: Secondary | ICD-10-CM

## 2018-07-19 DIAGNOSIS — H9311 Tinnitus, right ear: Secondary | ICD-10-CM

## 2018-07-19 DIAGNOSIS — I878 Other specified disorders of veins: Secondary | ICD-10-CM

## 2018-07-19 LAB — GLUCOSE, CAPILLARY: Glucose-Capillary: 85 mg/dL (ref 70–99)

## 2018-07-19 MED ORDER — TERBINAFINE HCL 250 MG PO TABS: 250.0000 mg | ORAL_TABLET | Freq: Every day | ORAL | 0 refills | Status: DC

## 2018-07-19 MED ORDER — MISC. DEVICES MISC: 0 refills | Status: DC

## 2018-07-19 NOTE — Discharge Instructions (Signed)
Use Domeboro Astringent Solution Powder as directed on the box. This will help your athlete's foot.

## 2018-07-19 NOTE — ED Triage Notes (Addendum)
Pt reports ear fullness and loss of hearing x3 days in his right ear.  Pt also reports a fungus to his feet bilaterally that he has tried to treat at home with an OTC spray with no relief.

## 2018-07-19 NOTE — ED Provider Notes (Signed)
[place]   [id number] 07/19/18 Arrival Time: 9622  ASSESSMENT & PLAN:  1. Tinea pedis of both feet   2. Venous stasis of both lower extremities   3. Onychomycosis   4. Tinnitus of right ear     Meds ordered this encounter  Medications  . Misc. Devices MISC    Sig: MEDICAL COMPRESSION STOCKINGS Diagnosis: lower extremity venous stasis    Dispense:  1 each    Refill:  0  . terbinafine (LAMISIL) 250 MG tablet    Sig: Take 1 tablet (250 mg total) by mouth daily.    Dispense:  84 tablet    Refill:  0   Encouraged him to establish care with a PCP. He agrees.  Labs Reviewed  GLUCOSE, CAPILLARY   73   May f/u here as needed.  Reviewed expectations re: course of current medical issues. Questions answered. Outlined signs and symptoms indicating need for more acute intervention. Patient verbalized understanding. After Visit Summary given.   SUBJECTIVE:  PERRIN GENS is a 37 y.o. male who presents with a skin complaint.   Location: bilateral soles and sides of feet Onset: gradual Duration: over many months, maybe longer Pruritic? Yes Painful? Occasional burning Progression: stable  Drainage? No  Known trigger? No  New soaps/lotions/topicals/detergents? No Contacts with similar? No Recent travel? No  Other associated symptoms: none Therapies tried thus far: none Denies fever. No specific aggravating or alleviating factors reported.  Works as a Furniture conservator/restorer. Loud environment. Does not wear ear protection. Reports a "buzzing" in his ear for the past 3 days. No ear pain or ear drainage.  Also has noticed slight darkening of skin of his lower extremities over the past year, at least. Occasional swelling of lower extremities. No associated pain. Ambulatory without difficulty.  No h/o diabetes.  ROS: As per HPI.  OBJECTIVE: Vitals:   07/19/18 1305  BP: 124/85  Pulse: (!) 54  Temp: (!) 97.4 F (36.3 C)  TempSrc: Oral  SpO2: 99%    General  appearance: alert; no distress HEENT: TMs normal bilaterally Neck: FROM; supple Lungs: clear to auscultation bilaterally Heart: regular rhythm; recheck pulse 62 Extremities: no edema Skin: warm and dry; diffuse hyperkeratotic eruption involving the soles and medial and lateral surfaces of the feet; some underlying erythema Psychological: alert and cooperative; normal mood and affect  No Known Allergies  Past Medical History:  Diagnosis Date  . Hypertension    Social History   Socioeconomic History  . Marital status: Single    Spouse name: Not on file  . Number of children: Not on file  . Years of education: Not on file  . Highest education level: Not on file  Occupational History  . Not on file  Social Needs  . Financial resource strain: Not on file  . Food insecurity:    Worry: Not on file    Inability: Not on file  . Transportation needs:    Medical: Not on file    Non-medical: Not on file  Tobacco Use  . Smoking status: Current Every Day Smoker    Packs/day: 0.50    Types: Cigarettes  . Smokeless tobacco: Never Used  Substance and Sexual Activity  . Alcohol use: No  . Drug use: No  . Sexual activity: Not on file  Lifestyle  . Physical activity:    Days per week: Not on file    Minutes per session: Not on file  . Stress: Not on file  Relationships  .  Social connections:    Talks on phone: Not on file    Gets together: Not on file    Attends religious service: Not on file    Active member of club or organization: Not on file    Attends meetings of clubs or organizations: Not on file    Relationship status: Not on file  . Intimate partner violence:    Fear of current or ex partner: Not on file    Emotionally abused: Not on file    Physically abused: Not on file    Forced sexual activity: Not on file  Other Topics Concern  . Not on file  Social History Narrative  . Not on file    History reviewed. No pertinent surgical history.   Vanessa Kick,  MD 08/01/18 [phone]

## 2018-07-19 NOTE — Medical Student Note (Signed)
[facility] Statistician Note For educational purposes for Medical, PA and NP students only and not part of the legal medical record.   CSN: [id number] Arrival date & time: 07/19/18  1210     History   Chief Complaint Chief Complaint  Patient presents with  . Ear Fullness    right  . foot fungus    bilateral    HPI Gerald Mills is a 37 y.o. male presents today with complaints of ear fullness and foot fungus.  Ear Fullness:  O- 3 days ago while at work. Works in Buyer, retail.  L- R ear D- continuous  C- fullness like water is in the ear A- chewing, walking R- none T- Ibuprofen  S- severe, extremely bothersome   Foot Fungus:  O- years  L- bilateral medial feet  D- continuous without improvement C- itchy, flakly; yellow toenails  A- heat, sweat  R- nothing  T- OTC creams  S- really bothersome d/t itchiness   Patient also complains of discoloration in BLE shins that has been present for years. No aggravating or relieving factors. Nothing has helped to improve the discoloration. No pain or itching noted. The appearance is bothersome.   HPI  Past Medical History:  Diagnosis Date  . Hypertension     There are no active problems to display for this patient.   History reviewed. No pertinent surgical history.     Home Medications    Prior to Admission medications   Medication Sig Start Date End Date Taking? Authorizing Provider  benzoyl peroxide 5 % external liquid Apply topically 2 (two) times daily. 02/27/18   Wurst, [place], PA-C  diclofenac sodium (VOLTAREN) 1 % GEL Apply 2 g topically 4 (four) times daily. 04/20/17   Maczis, Barth Kirks, PA-C  escitalopram (LEXAPRO) 10 MG tablet Take 1 tablet (10 mg total) by mouth daily. 10/29/17   Robyn Haber, MD  hydrochlorothiazide (HYDRODIURIL) 25 MG tablet Take 1 tablet (25 mg total) by mouth daily. 04/04/17   Marney Setting, NP  lisinopril-hydrochlorothiazide  (PRINZIDE,ZESTORETIC) 10-12.5 MG tablet Take 1 tablet by mouth daily. 02/27/18   Wurst, [place], PA-C  Misc. Devices MISC MEDICAL COMPRESSION STOCKINGS Diagnosis: lower extremity venous stasis 07/19/18   Vanessa Kick, MD  terbinafine (LAMISIL) 250 MG tablet Take 1 tablet (250 mg total) by mouth daily. 07/19/18   Vanessa Kick, MD    Family History History reviewed. No pertinent family history.  Social History Social History   Tobacco Use  . Smoking status: Current Every Day Smoker    Packs/day: 0.50    Types: Cigarettes  . Smokeless tobacco: Never Used  Substance Use Topics  . Alcohol use: No  . Drug use: No     Allergies   Patient has no known allergies.   Review of Systems Review of Systems  HENT: Positive for ear pain and hearing loss. Negative for congestion, ear discharge, postnasal drip, sinus pressure, sinus pain and sore throat.        Describes hearing loss as muffled. Described fullness as if he needed his ear to pop.   Skin: Positive for color change.       Complaints of discoloration skin on anterior shin.      Physical Exam Updated Vital Signs BP 124/85 (BP Location: Left Wrist)   Pulse (!) 54   Temp (!) 97.4 F (36.3 C) (Oral)   SpO2 99%   Physical Exam  HENT:  Right Ear: Tympanic membrane, external  ear and ear canal normal. No drainage, swelling or tenderness. No foreign bodies. Tympanic membrane is not scarred, not perforated, not erythematous and not bulging. No middle ear effusion. Decreased hearing is noted.  Experiencing ringing in right ear with pulling on external ear.   Skin: Skin is warm, dry and intact.  Bilateral medial feet have yellowish dry, flaky skin. Bilateral great toenails are yellowish in coloration and thickened.   Discoloration of BLE shins noted. Darker tone present.      ED Treatments / Results  Labs (all labs ordered are listed, but only abnormal results are displayed) Labs Reviewed  GLUCOSE, CAPILLARY     EKG None Radiology No results found.  Procedures Procedures (including critical care time)  Medications Ordered in ED Medications - No data to display   Initial Impression / Assessment and Plan / ED Course  I have reviewed the triage vital signs and the nursing notes.  Pertinent labs & imaging results that were available during my care of the patient were reviewed by me and considered in my medical decision making (see chart for details).     Tinea Pedis/ Onychomycosis:  Terbinafine prescribed  OTC antifungal creams & Domeboro recommended  Encouraged to keep socks dry either by changing them consistently or investing in mositure wicking socks  Tinnitus:   Continue to watch changes in hearing. If worsens, FU with UC or PC for potential further testing.  Recommendation is to use noise protection within his work environment.   Venous Stasis BLE:  Prescription given for medical compression hose.    Final Clinical Impressions(s) / ED Diagnoses   Final diagnoses:  Tinea pedis of both feet  Venous stasis of both lower extremities  Onychomycosis  Tinnitus of right ear    New Prescriptions New Prescriptions   MISC. DEVICES MISC    MEDICAL COMPRESSION STOCKINGS Diagnosis: lower extremity venous stasis   TERBINAFINE (LAMISIL) 250 MG TABLET    Take 1 tablet (250 mg total) by mouth daily.

## 2018-10-02 DIAGNOSIS — H5213 Myopia, bilateral: Secondary | ICD-10-CM | POA: Diagnosis not present

## 2018-12-20 DIAGNOSIS — L810 Postinflammatory hyperpigmentation: Secondary | ICD-10-CM | POA: Diagnosis not present

## 2018-12-20 DIAGNOSIS — L700 Acne vulgaris: Secondary | ICD-10-CM | POA: Diagnosis not present

## 2019-03-12 DIAGNOSIS — L700 Acne vulgaris: Secondary | ICD-10-CM | POA: Diagnosis not present

## 2019-03-12 DIAGNOSIS — L731 Pseudofolliculitis barbae: Secondary | ICD-10-CM | POA: Diagnosis not present

## 2019-03-12 DIAGNOSIS — L858 Other specified epidermal thickening: Secondary | ICD-10-CM | POA: Diagnosis not present

## 2019-05-08 ENCOUNTER — Ambulatory Visit (HOSPITAL_COMMUNITY)
Admission: EM | Admit: 2019-05-08 | Discharge: 2019-05-08 | Disposition: A | Discharge status: Home / Self Care | Payer: BC Managed Care – PPO | Attending: Family Medicine | Admitting: Family Medicine

## 2019-05-08 ENCOUNTER — Other Ambulatory Visit: Payer: Self-pay

## 2019-05-08 ENCOUNTER — Encounter (HOSPITAL_COMMUNITY): Payer: Self-pay

## 2019-05-08 DIAGNOSIS — H1033 Unspecified acute conjunctivitis, bilateral: Secondary | ICD-10-CM

## 2019-05-08 MED ORDER — POLYMYXIN B-TRIMETHOPRIM 10000-0.1 UNIT/ML-% OP SOLN: 1.0000 [drp] | Freq: Four times a day (QID) | OPHTHALMIC | 0 refills | Status: AC

## 2019-05-08 NOTE — Discharge Instructions (Signed)
Use eye drops four times a day for 5-7 days.  Follow up with your eye care doctor if symptoms persist.  Go to ER if you develop acute eye pain, change in vision, fever/chills, or other concerning symptoms.

## 2019-05-08 NOTE — ED Triage Notes (Signed)
Patient presents to Urgent Care with complaints of left eye irritation since 4 days ago. Patient reports he thought maybe an eyelash wad stuck in it, but it has continued to irritate him and the irritation has spread to his right eye now too. Left eye inflamed.

## 2019-05-08 NOTE — ED Provider Notes (Signed)
[place]    CSN: [id number] Arrival date & time: 05/08/19  1039     History   Chief Complaint Chief Complaint  Patient presents with  . Eye Problem    HPI Gerald Mills is a 38 y.o. male.   Patient presents today with a 4-day history of left eye irritation, redness, itching, and yellow drainage.  He states that this is now started in his right eye also.  He states that he has tried using over-the-counter eyedrops which were given to him by a friend but does not know the name of these eyedrops or what medication is in them, if any.  He denies any eye injury or foreign body sensation.  He denies fever or chills.  The history is provided by the patient.    Past Medical History:  Diagnosis Date  . Hypertension     There are no active problems to display for this patient.   History reviewed. No pertinent surgical history.     Home Medications    Prior to Admission medications   Medication Sig Start Date End Date Taking? Authorizing Provider  benzoyl peroxide 5 % external liquid Apply topically 2 (two) times daily. 02/27/18   Wurst, [place], PA-C  diclofenac sodium (VOLTAREN) 1 % GEL Apply 2 g topically 4 (four) times daily. 04/20/17   Maczis, Barth Kirks, PA-C  escitalopram (LEXAPRO) 10 MG tablet Take 1 tablet (10 mg total) by mouth daily. 10/29/17   Robyn Haber, MD  hydrochlorothiazide (HYDRODIURIL) 25 MG tablet Take 1 tablet (25 mg total) by mouth daily. 04/04/17   Marney Setting, NP  lisinopril-hydrochlorothiazide (PRINZIDE,ZESTORETIC) 10-12.5 MG tablet Take 1 tablet by mouth daily. 02/27/18   Wurst, [place], PA-C  Misc. Devices MISC MEDICAL COMPRESSION STOCKINGS Diagnosis: lower extremity venous stasis 07/19/18   Vanessa Kick, MD  terbinafine (LAMISIL) 250 MG tablet Take 1 tablet (250 mg total) by mouth daily. 07/19/18   Vanessa Kick, MD  trimethoprim-polymyxin b (POLYTRIM) ophthalmic solution Place 1 drop into both eyes 4 (four) times daily for 7  days. 05/08/19 05/15/19  Sharion Balloon, NP    Family History Family History  Problem Relation Age of Onset  . Diabetes Mother   . Healthy Father     Social History Social History   Tobacco Use  . Smoking status: Current Every Day Smoker    Packs/day: 0.50    Types: Cigarettes  . Smokeless tobacco: Never Used  Substance Use Topics  . Alcohol use: No  . Drug use: No     Allergies   Patient has no known allergies.   Review of Systems Review of Systems  Constitutional: Negative for chills and fever.  HENT: Negative for ear pain and sore throat.   Eyes: Positive for discharge, redness and itching. Negative for pain and visual disturbance.  Respiratory: Negative for cough and shortness of breath.   Cardiovascular: Negative for chest pain and palpitations.  Gastrointestinal: Negative for abdominal pain and vomiting.  Genitourinary: Negative for dysuria and hematuria.  Musculoskeletal: Negative for arthralgias and back pain.  Skin: Negative for color change and rash.  Neurological: Negative for seizures and syncope.  All other systems reviewed and are negative.    Physical Exam Triage Vital Signs ED Triage Vitals  Enc Vitals Group     BP 05/08/19 1125 (!) 174/95     Pulse Rate 05/08/19 1125 62     Resp 05/08/19 1125 16     Temp 05/08/19 1125 97.7 F (36.5  C)     Temp Source 05/08/19 1125 Oral     SpO2 05/08/19 1125 97 %     Weight --      Height --      Head Circumference --      Peak Flow --      Pain Score 05/08/19 1121 0     Pain Loc --      Pain Edu? --      Excl. in [place]? --    No data found.  Updated Vital Signs BP (!) 174/95 (BP Location: Right Arm)   Pulse 62   Temp 97.7 F (36.5 C) (Oral)   Resp 16   SpO2 97%   Visual Acuity Right Eye Distance:   Left Eye Distance:   Bilateral Distance:    Right Eye Near:   Left Eye Near:    Bilateral Near:     Physical Exam Vitals signs and nursing note reviewed.  Constitutional:      Appearance: He  is well-developed.  HENT:     Head: Normocephalic and atraumatic.  Eyes:     General: Lids are normal. Vision grossly intact.        Right eye: Discharge present.        Left eye: Discharge present.    Extraocular Movements: Extraocular movements intact.     Conjunctiva/sclera:     Right eye: Right conjunctiva is injected.     Left eye: Left conjunctiva is injected.     Comments: Scant white-yellow drainage in inner canthus of both eyes, L>R.   Neck:     Musculoskeletal: Neck supple.  Cardiovascular:     Rate and Rhythm: Normal rate and regular rhythm.  Pulmonary:     Effort: Pulmonary effort is normal. No respiratory distress.     Breath sounds: Normal breath sounds.  Abdominal:     Palpations: Abdomen is soft.     Tenderness: There is no abdominal tenderness.  Skin:    General: Skin is warm and dry.  Neurological:     Mental Status: He is alert.      UC Treatments / Results  Labs (all labs ordered are listed, but only abnormal results are displayed) Labs Reviewed - No data to display  EKG None  Radiology No results found.  Procedures Procedures (including critical care time)  Medications Ordered in UC Medications - No data to display  Initial Impression / Assessment and Plan / UC Course  I have reviewed the triage vital signs and the nursing notes.  Pertinent labs & imaging results that were available during my care of the patient were reviewed by me and considered in my medical decision making (see chart for details).   Conjunctivitis, bilateral.  History and exam consistent today with bacterial conjunctivitis bilaterally.  Treating today with Polytrim eyedrops x 5-7 days.  Discussed with patient transmission precautions.  Discussed with patient the need to go to the emergency department if he develops acute eye pain, changes in vision, fever, chills, or other concerning symptoms.  Follow-up with [facility] in 2-3 days if symptoms persist.   Final Clinical  Impressions(s) / UC Diagnoses   Final diagnoses:  Acute bacterial conjunctivitis of both eyes     Discharge Instructions     Use eye drops four times a day for 5-7 days.  Follow up with your eye care doctor if symptoms persist.  Go to ER if you develop acute eye pain, change in vision, fever/chills, or other concerning symptoms.  ED Prescriptions    Medication Sig Dispense Auth. Provider   trimethoprim-polymyxin b (POLYTRIM) ophthalmic solution Place 1 drop into both eyes 4 (four) times daily for 7 days. 10 mL Sharion Balloon, NP     Controlled Substance Prescriptions [place] Controlled Substance Registry consulted? Not Applicable   Sharion Balloon, NP 05/08/19 [phone]

## 2019-05-21 ENCOUNTER — Ambulatory Visit (INDEPENDENT_AMBULATORY_CARE_PROVIDER_SITE_OTHER): Payer: BC Managed Care – PPO

## 2019-05-21 ENCOUNTER — Ambulatory Visit (HOSPITAL_COMMUNITY)
Admission: EM | Admit: 2019-05-21 | Discharge: 2019-05-21 | Disposition: A | Discharge status: Home / Self Care | Payer: BC Managed Care – PPO | Attending: Emergency Medicine | Admitting: Emergency Medicine

## 2019-05-21 ENCOUNTER — Other Ambulatory Visit (HOSPITAL_COMMUNITY): Payer: Self-pay | Admitting: Cardiology

## 2019-05-21 ENCOUNTER — Encounter (HOSPITAL_COMMUNITY): Payer: Self-pay

## 2019-05-21 ENCOUNTER — Telehealth: Payer: Self-pay | Admitting: *Deleted

## 2019-05-21 ENCOUNTER — Other Ambulatory Visit: Payer: Self-pay

## 2019-05-21 DIAGNOSIS — Z20828 Contact with and (suspected) exposure to other viral communicable diseases: Secondary | ICD-10-CM

## 2019-05-21 DIAGNOSIS — R05 Cough: Secondary | ICD-10-CM

## 2019-05-21 DIAGNOSIS — I10 Essential (primary) hypertension: Secondary | ICD-10-CM

## 2019-05-21 DIAGNOSIS — R058 Other specified cough: Secondary | ICD-10-CM

## 2019-05-21 DIAGNOSIS — Z20822 Contact with and (suspected) exposure to covid-19: Secondary | ICD-10-CM

## 2019-05-21 MED ORDER — LISINOPRIL-HYDROCHLOROTHIAZIDE 10-12.5 MG PO TABS: 1.0000 | ORAL_TABLET | Freq: Every day | ORAL | 0 refills | Status: DC

## 2019-05-21 MED ORDER — CLONIDINE HCL 0.1 MG PO TABS
0.1000 mg | ORAL_TABLET | Freq: Once | ORAL | Status: AC
  Administered 2019-05-21: 0.1 mg via ORAL

## 2019-05-21 MED ORDER — CLONIDINE HCL 0.1 MG PO TABS
ORAL_TABLET | ORAL | Status: AC
  Filled 2019-05-21: qty 1

## 2019-05-21 NOTE — ED Provider Notes (Signed)
[place]    CSN: [id number] Arrival date & time: 05/21/19  1210     History   Chief Complaint Chief Complaint  Patient presents with  . Cough    HPI Gerald Mills is a 38 y.o. male.   Patient presents today with cough productive of yellow sputum x1 week.  He is a current smoker but states this cough is "different" and that a coworker has tested COVID positive.  He also reports that he has been out of his blood pressure medicine for "a while" and does not have a primary care provider.  He denies fever, chills, dizziness, chest pain, palpitations, shortness of breath, focal weakness, or other symptoms.    The history is provided by the patient.    Past Medical History:  Diagnosis Date  . Hypertension     There are no active problems to display for this patient.   No past surgical history on file.     Home Medications    Prior to Admission medications   Medication Sig Start Date End Date Taking? Authorizing Provider  benzoyl peroxide 5 % external liquid Apply topically 2 (two) times daily. 02/27/18   Wurst, [place], PA-C  diclofenac sodium (VOLTAREN) 1 % GEL Apply 2 g topically 4 (four) times daily. 04/20/17   Maczis, Barth Kirks, PA-C  escitalopram (LEXAPRO) 10 MG tablet Take 1 tablet (10 mg total) by mouth daily. 10/29/17   Robyn Haber, MD  hydrochlorothiazide (HYDRODIURIL) 25 MG tablet Take 1 tablet (25 mg total) by mouth daily. 04/04/17   Marney Setting, NP  lisinopril-hydrochlorothiazide (ZESTORETIC) 10-12.5 MG tablet Take 1 tablet by mouth daily. 05/21/19   Sharion Balloon, NP  Misc. Devices MISC MEDICAL COMPRESSION STOCKINGS Diagnosis: lower extremity venous stasis 07/19/18   Vanessa Kick, MD  terbinafine (LAMISIL) 250 MG tablet Take 1 tablet (250 mg total) by mouth daily. 07/19/18   Vanessa Kick, MD    Family History Family History  Problem Relation Age of Onset  . Diabetes Mother   . Healthy Father     Social History Social History    Tobacco Use  . Smoking status: Current Every Day Smoker    Packs/day: 0.50    Types: Cigarettes  . Smokeless tobacco: Never Used  Substance Use Topics  . Alcohol use: No  . Drug use: No     Allergies   Patient has no known allergies.   Review of Systems Review of Systems  Constitutional: Negative for chills and fever.  HENT: Negative for ear pain and sore throat.   Eyes: Negative for pain and visual disturbance.  Respiratory: Positive for cough. Negative for shortness of breath.   Cardiovascular: Negative for chest pain and palpitations.  Gastrointestinal: Negative for abdominal pain and vomiting.  Genitourinary: Negative for dysuria and hematuria.  Musculoskeletal: Negative for arthralgias and back pain.  Skin: Negative for color change and rash.  Neurological: Negative for seizures and syncope.  All other systems reviewed and are negative.    Physical Exam Triage Vital Signs ED Triage Vitals [05/21/19 1342]  Enc Vitals Group     BP (!) 217/122     Pulse Rate 66     Resp 16     Temp 98 F (36.7 C)     Temp Source Oral     SpO2 100 %     Weight      Height      Head Circumference      Peak Flow  Pain Score      Pain Loc      Pain Edu?      Excl. in [place]?    No data found.  Updated Vital Signs BP (!) 202/120 (BP Location: Left Arm)   Pulse 66   Temp 98 F (36.7 C) (Oral)   Resp 16   SpO2 100%   Visual Acuity Right Eye Distance:   Left Eye Distance:   Bilateral Distance:    Right Eye Near:   Left Eye Near:    Bilateral Near:     Physical Exam Vitals signs and nursing note reviewed.  Constitutional:      Appearance: He is well-developed.  HENT:     Head: Normocephalic and atraumatic.  Eyes:     Conjunctiva/sclera: Conjunctivae normal.  Neck:     Musculoskeletal: Neck supple.  Cardiovascular:     Rate and Rhythm: Normal rate and regular rhythm.  Pulmonary:     Effort: Pulmonary effort is normal. No respiratory distress.     Breath  sounds: Rhonchi present.  Abdominal:     Palpations: Abdomen is soft.     Tenderness: There is no abdominal tenderness.  Skin:    General: Skin is warm and dry.  Neurological:     General: No focal deficit present.     Mental Status: He is alert and oriented to person, place, and time.     Sensory: No sensory deficit.     Motor: No weakness.     Coordination: Coordination normal.     Gait: Gait normal.     Deep Tendon Reflexes: Reflexes normal.    Vitals:   05/21/19 1342 05/21/19 1531  BP: (!) 217/122 (!) 202/120  Pulse: 66   Resp: 16   Temp: 98 F (36.7 C)   SpO2: 100%      UC Treatments / Results  Labs (all labs ordered are listed, but only abnormal results are displayed) Labs Reviewed - No data to display  EKG   Radiology Dg Chest 2 View  Result Date: 05/21/2019 CLINICAL DATA:  Cough EXAM: CHEST - 2 VIEW COMPARISON:  January 21, 2011. FINDINGS: The lungs are clear. The heart size and pulmonary vascularity are normal. No adenopathy. No bone lesions. IMPRESSION: No edema or consolidation. Electronically Signed   By: Lowella Grip III M.D.   On: 05/21/2019 15:23    Procedures Procedures (including critical care time)  Medications Ordered in UC Medications  cloNIDine (CATAPRES) tablet 0.1 mg (0.1 mg Oral Given 05/21/19 1449)  cloNIDine (CATAPRES) 0.1 MG tablet (has no administration in time range)    Initial Impression / Assessment and Plan / UC Course  I have reviewed the triage vital signs and the nursing notes.  Pertinent labs & imaging results that were available during my care of the patient were reviewed by me and considered in my medical decision making (see chart for details).   Suspect COVID. Elevated blood pressure in known hypertensive. CXR negative.  COVID test ordered.  Discussed with patient that he should self quarantine until his COVID test results are back and are negative.  Clonidine given x1 dose here; refill of lisinopril/HCTZ given for 30  days; patient instructed to establish primary care provider to have blood pressure rechecked in the next week.  Strict instructions to go the emergency room for dizziness, chest pain, shortness of breath, weakness, or other concerning symptoms.  Discussed care plan with Dr. Lanny Cramp and he agrees.     Final Clinical Impressions(s) /  UC Diagnoses   Final diagnoses:  Productive cough  Suspected Covid-19 Virus Infection  Elevated blood pressure reading in office with diagnosis of hypertension     Discharge Instructions     Your COVID test has been ordered.  You should receive a phone call in the next little while to schedule this.  You should self quarantine until your test results come back negative.    Your blood pressure was elevated today. It was 217/122 and 202/120.  A refill of your blood pressure medication has been called in.  Please take this medication as prescribed.  Call the primary care provider listed and establish yourself as a new patient.  Go to the emergency department if you develop dizziness, chest pain, shortness of breath, weakness, or other concerning symptoms.       ED Prescriptions    Medication Sig Dispense Auth. Provider   lisinopril-hydrochlorothiazide (ZESTORETIC) 10-12.5 MG tablet Take 1 tablet by mouth daily. 30 tablet Sharion Balloon, NP     Controlled Substance Prescriptions [place] Controlled Substance Registry consulted? Not Applicable   Sharion Balloon, NP 05/21/19 1551

## 2019-05-21 NOTE — Telephone Encounter (Signed)
Scheduled patient for COVID 19 test tomorrow at 1:45 pm.  Testing protocol reviewed with patient.

## 2019-05-21 NOTE — Telephone Encounter (Signed)
-----   Message from Sharion Balloon, NP sent at 05/21/2019  3:41 PM EDT ----- Regarding: need COVID test

## 2019-05-21 NOTE — Telephone Encounter (Signed)
Opened in error

## 2019-05-21 NOTE — Discharge Instructions (Addendum)
Your COVID test has been ordered.  You should receive a phone call in the next little while to schedule this.  You should self quarantine until your test results come back negative.    Your blood pressure was elevated today. It was 217/122 and 202/120.  A refill of your blood pressure medication has been called in.  Please take this medication as prescribed.  Call the primary care provider listed and establish yourself as a new patient.  Go to the emergency department if you develop dizziness, chest pain, shortness of breath, weakness, or other concerning symptoms.

## 2019-05-21 NOTE — ED Triage Notes (Signed)
Patient presents to Urgent Care with complaints of intermittent productive cough since about a week ago. Patient reports he has a chronic smoker's cough, but this feels different.

## 2019-05-22 ENCOUNTER — Other Ambulatory Visit: Payer: BC Managed Care – PPO

## 2019-05-22 DIAGNOSIS — R6889 Other general symptoms and signs: Secondary | ICD-10-CM | POA: Diagnosis not present

## 2019-05-22 DIAGNOSIS — Z20822 Contact with and (suspected) exposure to covid-19: Secondary | ICD-10-CM

## 2019-05-26 LAB — NOVEL CORONAVIRUS, NAA: SARS-CoV-2, NAA: NOT DETECTED

## 2019-08-25 ENCOUNTER — Encounter (HOSPITAL_COMMUNITY): Payer: Self-pay

## 2019-08-25 ENCOUNTER — Ambulatory Visit (HOSPITAL_COMMUNITY)
Admission: EM | Admit: 2019-08-25 | Discharge: 2019-08-25 | Disposition: A | Discharge status: Home / Self Care | Payer: BC Managed Care – PPO | Attending: Family Medicine | Admitting: Family Medicine

## 2019-08-25 ENCOUNTER — Other Ambulatory Visit: Payer: Self-pay

## 2019-08-25 DIAGNOSIS — Z202 Contact with and (suspected) exposure to infections with a predominantly sexual mode of transmission: Secondary | ICD-10-CM | POA: Insufficient documentation

## 2019-08-25 DIAGNOSIS — I10 Essential (primary) hypertension: Secondary | ICD-10-CM | POA: Diagnosis not present

## 2019-08-25 MED ORDER — AZITHROMYCIN 250 MG PO TABS
ORAL_TABLET | ORAL | Status: AC
  Filled 2019-08-25: qty 4

## 2019-08-25 MED ORDER — AZITHROMYCIN 250 MG PO TABS
1000.0000 mg | ORAL_TABLET | Freq: Once | ORAL | Status: AC
  Administered 2019-08-25: 12:00:00 1000 mg via ORAL

## 2019-08-25 MED ORDER — LIDOCAINE HCL (PF) 1 % IJ SOLN
INTRAMUSCULAR | Status: AC
  Filled 2019-08-25: qty 2

## 2019-08-25 MED ORDER — CEFTRIAXONE SODIUM 250 MG IJ SOLR
INTRAMUSCULAR | Status: AC
  Filled 2019-08-25: qty 250

## 2019-08-25 MED ORDER — AMLODIPINE BESYLATE 5 MG PO TABS: 5.0000 mg | ORAL_TABLET | Freq: Every day | ORAL | 3 refills | Status: DC

## 2019-08-25 MED ORDER — CEFTRIAXONE SODIUM 250 MG IJ SOLR
250.0000 mg | Freq: Once | INTRAMUSCULAR | Status: AC
  Administered 2019-08-25: 12:00:00 250 mg via INTRAMUSCULAR

## 2019-08-25 MED ORDER — CHLORTHALIDONE 25 MG PO TABS: 25.0000 mg | ORAL_TABLET | Freq: Every day | ORAL | 3 refills | Status: DC

## 2019-08-25 NOTE — ED Provider Notes (Addendum)
[place]    CSN: [id number] Arrival date & time: 08/25/19  1052      History   Chief Complaint Chief Complaint  Patient presents with  . SEXUALLY TRANSMITTED DISEASE    HPI Gerald Mills is a 38 y.o. male.   Is a 38 year old established most: Urgent care patient who wishes to be checked for STD.  He had oral sex with his partner.  He is having no discharge or sore throat.  Patient works in [employer].  He works third shift and is chronically tired.  He has some swelling in his lower extremities.  He has not had his blood pressure medicine in a long time.  Patient denies shortness of breath, chest pain, or weakness.     Past Medical History:  Diagnosis Date  . Hypertension     There are no active problems to display for this patient.   History reviewed. No pertinent surgical history.     Home Medications    Prior to Admission medications   Medication Sig Start Date End Date Taking? Authorizing Provider  amLODipine (NORVASC) 5 MG tablet Take 1 tablet (5 mg total) by mouth daily. 08/25/19   Robyn Haber, MD  benzoyl peroxide 5 % external liquid Apply topically 2 (two) times daily. 02/27/18   Wurst, [place], PA-C  chlorthalidone (HYGROTON) 25 MG tablet Take 1 tablet (25 mg total) by mouth daily. 08/25/19   Robyn Haber, MD  Misc. Devices MISC MEDICAL COMPRESSION STOCKINGS Diagnosis: lower extremity venous stasis 07/19/18   Vanessa Kick, MD  escitalopram (LEXAPRO) 10 MG tablet Take 1 tablet (10 mg total) by mouth daily. 10/29/17 08/25/19  Robyn Haber, MD  hydrochlorothiazide (HYDRODIURIL) 25 MG tablet Take 1 tablet (25 mg total) by mouth daily. 04/04/17 08/25/19  Marney Setting, NP  lisinopril-hydrochlorothiazide (ZESTORETIC) 10-12.5 MG tablet Take 1 tablet by mouth daily. 05/21/19 08/25/19  Sharion Balloon, NP    Family History Family History  Problem Relation Age of Onset  . Diabetes Mother   . Healthy Father     Social History Social History   Tobacco Use  . Smoking status: Current Every Day Smoker    Packs/day: 0.50    Types: Cigarettes  . Smokeless tobacco: Never Used  Substance Use Topics  . Alcohol use: No  . Drug use: No     Allergies   Patient has no known allergies.   Review of Systems Review of Systems  Constitutional: Positive for fatigue.  HENT: Negative.   Respiratory: Negative.   Cardiovascular: Negative.   Genitourinary: Negative.   Neurological: Negative.   All other systems reviewed and are negative.    Physical Exam Triage Vital Signs ED Triage Vitals [08/25/19 1203]  Enc Vitals Group     BP      Pulse      Resp      Temp      Temp src      SpO2      Weight 280 lb (127 kg)     Height      Head Circumference      Peak Flow      Pain Score 0     Pain Loc      Pain Edu?      Excl. in [place]?    No data found.  Updated Vital Signs BP (!) 194/117 (BP Location: Right Arm)   Pulse 63   Temp 98.2 F (36.8 C) (Oral)   Resp 18  Wt 127 kg   SpO2 99%    Physical Exam Vitals signs and nursing note reviewed.  Constitutional:      Appearance: Normal appearance. He is obese. He is not ill-appearing.  Neck:     Musculoskeletal: Normal range of motion and neck supple.  Cardiovascular:     Rate and Rhythm: Normal rate.  Pulmonary:     Effort: Pulmonary effort is normal.  Genitourinary:    Penis: Normal.   Musculoskeletal: Normal range of motion.  Skin:    General: Skin is warm and dry.  Neurological:     General: No focal deficit present.     Mental Status: He is alert.  Psychiatric:        Mood and Affect: Mood normal.      UC Treatments / Results  Labs (all labs ordered are listed, but only abnormal results are displayed) Labs Reviewed  CYTOLOGY, (ORAL, ANAL, URETHRAL) ANCILLARY ONLY    EKG   Radiology No results found.  Procedures Procedures (including critical care time)  Medications Ordered in UC Medications  cefTRIAXone  (ROCEPHIN) injection 250 mg (has no administration in time range)  azithromycin (ZITHROMAX) tablet 1,000 mg (has no administration in time range)  azithromycin (ZITHROMAX) 250 MG tablet (has no administration in time range)  cefTRIAXone (ROCEPHIN) 250 MG injection (has no administration in time range)  lidocaine (PF) (XYLOCAINE) 1 % injection (has no administration in time range)    Initial Impression / Assessment and Plan / UC Course  I have reviewed the triage vital signs and the nursing notes.  Pertinent labs & imaging results that were available during my care of the patient were reviewed by me and considered in my medical decision making (see chart for details).    Final Clinical Impressions(s) / UC Diagnoses   Final diagnoses:  STD exposure  Essential hypertension   Discharge Instructions   None    ED Prescriptions    Medication Sig Dispense Auth. Provider   chlorthalidone (HYGROTON) 25 MG tablet Take 1 tablet (25 mg total) by mouth daily. 90 tablet Robyn Haber, MD   amLODipine (NORVASC) 5 MG tablet Take 1 tablet (5 mg total) by mouth daily. 90 tablet Robyn Haber, MD     I have reviewed the PDMP during this encounter.   Robyn Haber, MD 08/25/19 1221    Robyn Haber, MD 08/25/19 1224

## 2019-08-25 NOTE — ED Notes (Signed)
HTN, need for PCP, low sodium diet all discussed at length with pt.  Instructed to go to ED for severe HA, vision changes or any other concerns.  Pt verbalized understanding.

## 2019-08-25 NOTE — ED Triage Notes (Signed)
Pt states his partner told him he needs to be tested for STDs.

## 2019-08-29 LAB — CYTOLOGY, (ORAL, ANAL, URETHRAL) ANCILLARY ONLY
Chlamydia: NEGATIVE
Neisseria Gonorrhea: NEGATIVE
Trichomonas: NEGATIVE

## 2019-10-03 ENCOUNTER — Ambulatory Visit (HOSPITAL_COMMUNITY)
Admission: EM | Admit: 2019-10-03 | Discharge: 2019-10-03 | Disposition: A | Discharge status: Home / Self Care | Payer: BC Managed Care – PPO | Attending: Family Medicine | Admitting: Family Medicine

## 2019-10-03 ENCOUNTER — Other Ambulatory Visit: Payer: Self-pay

## 2019-10-03 ENCOUNTER — Encounter (HOSPITAL_COMMUNITY): Payer: Self-pay

## 2019-10-03 DIAGNOSIS — Z20828 Contact with and (suspected) exposure to other viral communicable diseases: Secondary | ICD-10-CM | POA: Diagnosis not present

## 2019-10-03 DIAGNOSIS — F17210 Nicotine dependence, cigarettes, uncomplicated: Secondary | ICD-10-CM | POA: Insufficient documentation

## 2019-10-03 DIAGNOSIS — Z202 Contact with and (suspected) exposure to infections with a predominantly sexual mode of transmission: Secondary | ICD-10-CM | POA: Diagnosis not present

## 2019-10-03 DIAGNOSIS — Z0189 Encounter for other specified special examinations: Secondary | ICD-10-CM

## 2019-10-03 DIAGNOSIS — I10 Essential (primary) hypertension: Secondary | ICD-10-CM

## 2019-10-03 DIAGNOSIS — Z79899 Other long term (current) drug therapy: Secondary | ICD-10-CM | POA: Insufficient documentation

## 2019-10-03 MED ORDER — CEFTRIAXONE SODIUM 250 MG IJ SOLR
250.0000 mg | Freq: Once | INTRAMUSCULAR | Status: AC
  Administered 2019-10-03: 250 mg via INTRAMUSCULAR

## 2019-10-03 MED ORDER — METRONIDAZOLE 500 MG PO TABS: 500.0000 mg | ORAL_TABLET | Freq: Two times a day (BID) | ORAL | 0 refills | Status: DC

## 2019-10-03 MED ORDER — AZITHROMYCIN 250 MG PO TABS
1000.0000 mg | ORAL_TABLET | Freq: Once | ORAL | Status: AC
  Administered 2019-10-03: 1000 mg via ORAL

## 2019-10-03 MED ORDER — CEFTRIAXONE SODIUM 250 MG IJ SOLR
INTRAMUSCULAR | Status: AC
  Filled 2019-10-03: qty 250

## 2019-10-03 MED ORDER — AZITHROMYCIN 250 MG PO TABS
ORAL_TABLET | ORAL | Status: AC
  Filled 2019-10-03: qty 4

## 2019-10-03 NOTE — ED Provider Notes (Signed)
[place]   [id number] 10/03/19 Arrival Time: [id number]  ASSESSMENT & PLAN:  1. Possible exposure to STD   2. Essential hypertension   3. Patient request for diagnostic testing       Discharge Instructions     You have been given the following medications today for treatment of suspected gonorrhea and/or chlamydia:  cefTRIAXone (ROCEPHIN) injection 250 mg azithromycin (ZITHROMAX) tablet 1,000 mg  Even though we have treated you today, we have sent testing for sexually transmitted infections. We will notify you of any positive results once they are received. If required, we will prescribe any medications you might need.  Please refrain from all sexual activity for at least the next seven days.  If your Covid-19 test is positive, you will get a phone call from [facility] regarding your results. If your Covid-19 test is negative, you will NOT get a phone call from [facility] with your results. You may view your results on MyChart. If you do not have a MyChart account, sign up instructions are in your discharge papers.     Pending: Labs Reviewed  NOVEL CORONAVIRUS, NAA (HOSP ORDER, SEND-OUT TO REF LAB; TAT 18-24 HRS)  CYTOLOGY, (ORAL, ANAL, URETHRAL) ANCILLARY ONLY   Will notify of any positive results. Instructed to refrain from sexual activity for at least seven days.  Instructed to increase amlodipine to 10mg  QD. He has an appt at the [facility] in December. May f/u here to recheck BP as he wishes. So s/s of hypertensive urgency.  Reviewed expectations re: course of current medical issues. Questions answered. Outlined signs and symptoms indicating need for more acute intervention. Patient verbalized understanding. After Visit Summary given.   SUBJECTIVE:  Gerald Mills is a 38 y.o. male who presents with complaint of possible exposure to an STD. Partner with trichomonas. He reports no penile discharge. "Does feel a little irritated after I pee".  Onset gradual. First noticed questions one week. Denies: urinary frequency. Afebrile. No abdominal or pelvic pain. No n/v. No rashes or lesions. Reports that he is sexually active with multiple male partners; approx 3 in the past one month; without regular condom use. History of STI: none reported.  Increased blood pressure noted today. Reports that he has started taking amlodipine about 3-4 weeks ago.  He reports taking medications as instructed, no medication side effects noted, no chest pain on exertion, no dyspnea on exertion, no swelling of ankles, no orthostatic dizziness or lightheadedness, no orthopnea or paroxysmal nocturnal dyspnea, no palpitations and no intermittent claudication symptoms.  Also requests COVID-19 testing. No known exposures but works around many people. No symptoms.  Social History   Tobacco Use  Smoking Status Current Every Day Smoker  . Packs/day: 0.50  . Types: Cigarettes  Smokeless Tobacco Never Used    ROS: As per HPI. All other systems negative.   OBJECTIVE:  Vitals:   10/03/19 1143  BP: (!) 179/109  Pulse: 74  Resp: 16  Temp: 98.2 F (36.8 C)  TempSrc: Oral  SpO2: 100%     General appearance: alert, cooperative, appears stated age and no distress Throat: lips, mucosa, and tongue normal; teeth and gums normal CV: RRR Lungs: CTAB Back: no CVA tenderness; FROM at waist Abdomen: soft, non-tender GU: normal appearing genitalia Skin: warm and dry Psychological: alert and cooperative; normal mood and affect.   Labs Reviewed  NOVEL CORONAVIRUS, NAA (HOSP ORDER, SEND-OUT TO REF LAB; TAT 18-24 HRS)  CYTOLOGY, (ORAL, ANAL, URETHRAL) ANCILLARY ONLY  No Known Allergies  Past Medical History:  Diagnosis Date  . Hypertension    Family History  Problem Relation Age of Onset  . Diabetes Mother   . Healthy Father    Social History   Socioeconomic History  . Marital status: Single    Spouse name: Not on file  . Number of children:  Not on file  . Years of education: Not on file  . Highest education level: Not on file  Occupational History  . Not on file  Social Needs  . Financial resource strain: Not on file  . Food insecurity    Worry: Not on file    Inability: Not on file  . Transportation needs    Medical: Not on file    Non-medical: Not on file  Tobacco Use  . Smoking status: Current Every Day Smoker    Packs/day: 0.50    Types: Cigarettes  . Smokeless tobacco: Never Used  Substance and Sexual Activity  . Alcohol use: No  . Drug use: No  . Sexual activity: Not on file  Lifestyle  . Physical activity    Days per week: Not on file    Minutes per session: Not on file  . Stress: Not on file  Relationships  . Social Herbalist on phone: Not on file    Gets together: Not on file    Attends religious service: Not on file    Active member of club or organization: Not on file    Attends meetings of clubs or organizations: Not on file    Relationship status: Not on file  . Intimate partner violence    Fear of current or ex partner: Not on file    Emotionally abused: Not on file    Physically abused: Not on file    Forced sexual activity: Not on file  Other Topics Concern  . Not on file  Social History Narrative  . Not on file          Vanessa Kick, MD 10/03/19 1242

## 2019-10-03 NOTE — Discharge Instructions (Signed)
You have been given the following medications today for treatment of suspected gonorrhea and/or chlamydia:  cefTRIAXone (ROCEPHIN) injection 250 mg azithromycin (ZITHROMAX) tablet 1,000 mg  Even though we have treated you today, we have sent testing for sexually transmitted infections. We will notify you of any positive results once they are received. If required, we will prescribe any medications you might need.  Please refrain from all sexual activity for at least the next seven days.  If your Covid-19 test is positive, you will get a phone call from [facility] regarding your results. If your Covid-19 test is negative, you will NOT get a phone call from [facility] with your results. You may view your results on MyChart. If you do not have a MyChart account, sign up instructions are in your discharge papers.

## 2019-10-03 NOTE — ED Triage Notes (Signed)
Patient presents to Urgent Care with complaints of needing testing and treatment for trichomonas since his partner exposed him recently. Patient reports he has irritating rash on his inner upper thighs.

## 2019-10-04 LAB — NOVEL CORONAVIRUS, NAA (HOSP ORDER, SEND-OUT TO REF LAB; TAT 18-24 HRS): SARS-CoV-2, NAA: NOT DETECTED

## 2019-10-05 LAB — CYTOLOGY, (ORAL, ANAL, URETHRAL) ANCILLARY ONLY
Chlamydia: NEGATIVE
Neisseria Gonorrhea: NEGATIVE
Trichomonas: NEGATIVE

## 2019-10-22 ENCOUNTER — Encounter (HOSPITAL_COMMUNITY): Payer: Self-pay | Admitting: Emergency Medicine

## 2019-10-22 ENCOUNTER — Other Ambulatory Visit: Payer: Self-pay

## 2019-10-22 ENCOUNTER — Ambulatory Visit (HOSPITAL_COMMUNITY)
Admission: EM | Admit: 2019-10-22 | Discharge: 2019-10-22 | Disposition: A | Discharge status: Home / Self Care | Payer: BC Managed Care – PPO | Attending: Physician Assistant | Admitting: Physician Assistant

## 2019-10-22 DIAGNOSIS — Z720 Tobacco use: Secondary | ICD-10-CM | POA: Diagnosis not present

## 2019-10-22 DIAGNOSIS — Z712 Person consulting for explanation of examination or test findings: Secondary | ICD-10-CM

## 2019-10-22 DIAGNOSIS — I10 Essential (primary) hypertension: Secondary | ICD-10-CM | POA: Diagnosis not present

## 2019-10-22 DIAGNOSIS — K5901 Slow transit constipation: Secondary | ICD-10-CM

## 2019-10-22 MED ORDER — AMLODIPINE BESYLATE 10 MG PO TABS: ORAL_TABLET | ORAL | 1 refills | Status: DC

## 2019-10-22 NOTE — Discharge Instructions (Addendum)
Increased Norvasc to 10mg  a day, BP controlled on check today. Try daily Miralax for constipation (OTC) and high fiber foods. Avoid fatty foods when possible. Stop smoking. Drink plenty of water. All of your recent STD testing was negative. Keep you follow up with PCP, as we do not manage BP here in our urgent care.

## 2019-10-22 NOTE — ED Provider Notes (Signed)
[place]    CSN: [id number] Arrival date & time: 10/22/19  [id number]      History   Chief Complaint Chief Complaint  Patient presents with  . Medication Refill  . Blood Pressure Check  . Penile Discharge    HPI Gerald Mills is a 38 y.o. male.   Presents in follow up to BP and need for a refill. He was here on 11/25 and his norvasc was increased to 10mg  a day. He is tolerating this well. He has a PCP appt in Jan which he is waiting for but needs a refill until then. He also suffers with chronic constipation and "bloating". No abdominal pain, N or emesis. He would also like his STD test results.      Past Medical History:  Diagnosis Date  . Hypertension     There are no problems to display for this patient.   History reviewed. No pertinent surgical history.     Home Medications    Prior to Admission medications   Medication Sig Start Date End Date Taking? Authorizing Provider  amLODipine (NORVASC) 5 MG tablet Take 1 tablet (5 mg total) by mouth daily. 08/25/19  Yes Robyn Haber, MD  chlorthalidone (HYGROTON) 25 MG tablet Take 1 tablet (25 mg total) by mouth daily. 08/25/19  Yes Robyn Haber, MD  benzoyl peroxide 5 % external liquid Apply topically 2 (two) times daily. 02/27/18   Wurst, [place], PA-C  metroNIDAZOLE (FLAGYL) 500 MG tablet Take 1 tablet (500 mg total) by mouth 2 (two) times daily. 10/03/19   Vanessa Kick, MD  Misc. Devices MISC MEDICAL COMPRESSION STOCKINGS Diagnosis: lower extremity venous stasis 07/19/18   Vanessa Kick, MD  escitalopram (LEXAPRO) 10 MG tablet Take 1 tablet (10 mg total) by mouth daily. 10/29/17 08/25/19  Robyn Haber, MD  hydrochlorothiazide (HYDRODIURIL) 25 MG tablet Take 1 tablet (25 mg total) by mouth daily. 04/04/17 08/25/19  Marney Setting, NP  lisinopril-hydrochlorothiazide (ZESTORETIC) 10-12.5 MG tablet Take 1 tablet by mouth daily. 05/21/19 08/25/19  Sharion Balloon, NP    Family History Family  History  Problem Relation Age of Onset  . Diabetes Mother   . Healthy Father     Social History Social History   Tobacco Use  . Smoking status: Current Every Day Smoker    Packs/day: 0.50    Types: Cigarettes  . Smokeless tobacco: Never Used  Substance Use Topics  . Alcohol use: No  . Drug use: No     Allergies   Patient has no known allergies.   Review of Systems Review of Systems  Constitutional: Negative for fever and unexpected weight change.  HENT: Negative.   Cardiovascular: Negative for chest pain and leg swelling.  Gastrointestinal: Positive for abdominal distention and constipation. Negative for rectal pain.  Skin: Negative.   Neurological: Negative for dizziness and light-headedness.  Psychiatric/Behavioral: Negative.      Physical Exam Triage Vital Signs ED Triage Vitals  Enc Vitals Group     BP 10/22/19 0948 123/89     Pulse Rate 10/22/19 0948 (!) 52     Resp 10/22/19 0948 18     Temp 10/22/19 0948 98.6 F (37 C)     Temp Source 10/22/19 0948 Oral     SpO2 10/22/19 0948 97 %     Weight --      Height --      Head Circumference --      Peak Flow --      Pain  Score 10/22/19 0950 0     Pain Loc --      Pain Edu? --      Excl. in [place]? --    No data found.  Updated Vital Signs BP 123/89 (BP Location: Left Arm)   Pulse (!) 52   Temp 98.6 F (37 C) (Oral)   Resp 18   SpO2 97%   Visual Acuity Right Eye Distance:   Left Eye Distance:   Bilateral Distance:    Right Eye Near:   Left Eye Near:    Bilateral Near:     Physical Exam Vitals and nursing note reviewed.  Constitutional:      Appearance: Normal appearance.  HENT:     Head: Normocephalic.  Eyes:     General: No scleral icterus. Cardiovascular:     Rate and Rhythm: Normal rate and regular rhythm.  Pulmonary:     Effort: Pulmonary effort is normal.     Breath sounds: Normal breath sounds.  Skin:    General: Skin is warm and dry.  Neurological:     General: No focal  deficit present.     Mental Status: He is alert.  Psychiatric:        Mood and Affect: Mood normal.      UC Treatments / Results  Labs (all labs ordered are listed, but only abnormal results are displayed) Labs Reviewed - No data to display  EKG   Radiology No results found.  Procedures Procedures (including critical care time)  Medications Ordered in UC Medications - No data to display  Initial Impression / Assessment and Plan / UC Course  I have reviewed the triage vital signs and the nursing notes.  Pertinent labs & imaging results that were available during my care of the patient were reviewed by me and considered in my medical decision making (see chart for details).     RX given for Norvasc at higher dose of 10mg /day. BP stable and tolerating meds.  Normal STD testing given Suggest a diet high in fiber, water and could try a daily Mira lax as well.  Also recommend smoking cessation Final Clinical Impressions(s) / UC Diagnoses   Final diagnoses:  None   Discharge Instructions   None    ED Prescriptions    None     PDMP not reviewed this encounter.   Bjorn Pippin, PA-C 10/22/19 1034

## 2019-10-22 NOTE — ED Triage Notes (Signed)
Pt is here for a BP recheck, and possible BP medication refill.  He was first prescribed his BP medication in October.  Upon return here in November he was instructed to double up on his BP medication.  Pt is going to run out of his medication before his refill is due and needs a new prescription showing the 10mg  dose as opposed to the 5mg  dose.  Pt is still also having penile discharge after taking antibiotics and having negative STD testing.  He also complains of continued lower abdominal bloating.

## 2019-11-27 ENCOUNTER — Other Ambulatory Visit (HOSPITAL_COMMUNITY)
Admission: RE | Admit: 2019-11-27 | Discharge: 2019-11-27 | Disposition: A | Discharge status: Home / Self Care | Payer: Self-pay | Source: Ambulatory Visit | Attending: Family Medicine | Admitting: Family Medicine

## 2019-11-27 ENCOUNTER — Other Ambulatory Visit: Payer: Self-pay

## 2019-11-27 ENCOUNTER — Encounter: Payer: Self-pay | Admitting: Family Medicine

## 2019-11-27 ENCOUNTER — Ambulatory Visit (INDEPENDENT_AMBULATORY_CARE_PROVIDER_SITE_OTHER): Payer: Self-pay | Admitting: Family Medicine

## 2019-11-27 VITALS — BP 150/100 | HR 74 | Ht 76.0 in | Wt 280.1 lb

## 2019-11-27 DIAGNOSIS — M7989 Other specified soft tissue disorders: Secondary | ICD-10-CM | POA: Diagnosis not present

## 2019-11-27 DIAGNOSIS — I10 Essential (primary) hypertension: Secondary | ICD-10-CM | POA: Diagnosis not present

## 2019-11-27 DIAGNOSIS — Z113 Encounter for screening for infections with a predominantly sexual mode of transmission: Secondary | ICD-10-CM | POA: Insufficient documentation

## 2019-11-27 DIAGNOSIS — F17200 Nicotine dependence, unspecified, uncomplicated: Secondary | ICD-10-CM

## 2019-11-27 DIAGNOSIS — Z131 Encounter for screening for diabetes mellitus: Secondary | ICD-10-CM

## 2019-11-27 MED ORDER — AMLODIPINE BESYLATE 10 MG PO TABS: ORAL_TABLET | ORAL | 3 refills | Status: DC

## 2019-11-27 NOTE — Patient Instructions (Signed)
I was so nice to meet you today Gerald Mills.  Here is a quick review of the things we talked about:  High blood pressure: Continue current medication right now.  Amlodipine 10 is good medication.  Keep an eye your lower extremity swelling over the next month.  We may end up adding another medication in 1 month.  STI testing: We will let you know the results of the test that we do today.  Blood tests: Again, we will let you know the results.

## 2019-11-27 NOTE — Assessment & Plan Note (Signed)
-  Follow-up GC/chlamydia -Follow-up RPR/HIV -Encouraged to use condoms with every encounter

## 2019-11-27 NOTE — Assessment & Plan Note (Signed)
Current smoker.  Cigarettes only.  No interest in quitting today.

## 2019-11-27 NOTE — Progress Notes (Signed)
    Subjective:  Gerald Mills is a 39 y.o. male who presents to the [facility] today with a chief complaint of clear penile discharge.   HPI:  HTN Currently being treated with amlodipine 10 mg.  No complaints of lightheadedness or dizziness.  No current complaints of headaches.  He reports that he "feels better" when he takes his medication.  Reports that he does take his medication every day.  He currently has 1 pill left from his current amlodipine medication.  STI testing Currently sexually active.  Had previously been in a committed relationship for roughly 20 years and had 3 children, this woman.  They are currently separated and have been seeing other people but they occasionally do get back together.  This has become a huge source of stress in his life.  He has been sexually active with other partners and wants to make sure that he will not bring anything back to the mother of his children.  He did have recent negative testing in November of this past year but has had encounters since that testing.  He is currently noting clear penile discharge without any evidence of burning or pain with urination or ejaculation.  Current smoker Smokes roughly 1 pack/day and has been smoking for the past 24 years.  He has been thinking about quitting but is not ready to make that decision today.  Lower extremity swelling He notes occasional lower extremity swelling.  He believes that related to him staying on his feet all day.  Does not happen every day this is not currently bothering him today.  This is not a painful occurrence.   Chief Complaint noted Review of Symptoms - see HPI  Objective:  Physical Exam: BP (!) 150/100   Pulse 74   Ht 6\' 4"  (1.93 m)   Wt 280 lb 2 oz (127.1 kg)   SpO2 97%   BMI 34.10 kg/m    BP recheck:   Gen: NAD, resting comfortably in the exam room.  Able to walk around the clinic without any issue. CV: RRR with no murmurs appreciated Pulm: NWOB, CTAB with no crackles,  wheezes, or rhonchi GI: Normal bowel sounds present. Soft, Nontender, Nondistended. MSK: no LE edema, cyanosis, or clubbing noted Neuro: Cranial nerves grossly intact  No results found for this or any previous visit (from the past 72 hour(s)).   Assessment/Plan:  Hypertension Poorly controlled on amlodipine 10 mg.  He reports that he had coffee shortly before coming here today.  Plan to bring him back to clinic in 1 month to repeat blood pressure and will likely start a second agent at that time.   -Follow-up BMP -Continue amlodipine 10 mg  Swelling of lower extremity No evidence of lower extremity swelling on exam today.  Extremities equal bilaterally.  This may simply be related to him being on his feet all day although gently can also be related to his current amlodipine. -Continue to monitor and follow-up in 1 month -Follow-up CBC  Current smoker Current smoker.  Cigarettes only.  No interest in quitting today.  Routine screening for STI (sexually transmitted infection) -Follow-up GC/chlamydia -Follow-up RPR/HIV -Encouraged to use condoms with every encounter  Screening for diabetes mellitus -Follow up GB on BMP  Follow-up in 1 month

## 2019-11-27 NOTE — Assessment & Plan Note (Signed)
Poorly controlled on amlodipine 10 mg.  He reports that he had coffee shortly before coming here today.  Plan to bring him back to clinic in 1 month to repeat blood pressure and will likely start a second agent at that time.   -Follow-up BMP -Continue amlodipine 10 mg

## 2019-11-27 NOTE — Assessment & Plan Note (Signed)
No evidence of lower extremity swelling on exam today.  Extremities equal bilaterally.  This may simply be related to him being on his feet all day although gently can also be related to his current amlodipine. -Continue to monitor and follow-up in 1 month -Follow-up CBC

## 2019-11-27 NOTE — Assessment & Plan Note (Signed)
-  Follow up GB on BMP

## 2019-11-28 LAB — BASIC METABOLIC PANEL
BUN/Creatinine Ratio: 17 (ref 9–20)
BUN: 15 mg/dL (ref 6–20)
CO2: 23 mmol/L (ref 20–29)
Calcium: 9.5 mg/dL (ref 8.7–10.2)
Chloride: 108 mmol/L — ABNORMAL HIGH (ref 96–106)
Creatinine, Ser: 0.87 mg/dL (ref 0.76–1.27)
GFR calc Af Amer: 127 mL/min/{1.73_m2} (ref >59)
GFR calc non Af Amer: 109 mL/min/{1.73_m2} (ref >59)
Glucose: 134 mg/dL — ABNORMAL HIGH (ref 65–99)
Potassium: 4.2 mmol/L (ref 3.5–5.2)
Sodium: 143 mmol/L (ref 134–144)

## 2019-11-28 LAB — RPR: RPR Ser Ql: NONREACTIVE

## 2019-11-28 LAB — CERVICOVAGINAL ANCILLARY ONLY
Chlamydia: NEGATIVE
Comment: NEGATIVE
Comment: NEGATIVE
Comment: NORMAL
Neisseria Gonorrhea: NEGATIVE
Trichomonas: NEGATIVE

## 2019-11-28 LAB — CBC
Hematocrit: 41.3 % (ref 37.5–51.0)
Hemoglobin: 13.5 g/dL (ref 13.0–17.7)
MCH: 30.1 pg (ref 26.6–33.0)
MCHC: 32.7 g/dL (ref 31.5–35.7)
MCV: 92 fL (ref 79–97)
Platelets: 216 10*3/uL (ref 150–450)
RBC: 4.49 x10E6/uL (ref 4.14–5.80)
RDW: 13.2 % (ref 11.6–15.4)
WBC: 4.5 10*3/uL (ref 3.4–10.8)

## 2019-11-28 LAB — HIV ANTIBODY (ROUTINE TESTING W REFLEX): HIV Screen 4th Generation wRfx: NONREACTIVE

## 2020-01-28 NOTE — Progress Notes (Deleted)
    SUBJECTIVE:   CHIEF COMPLAINT / HPI:   HTN Amlodipine  HM tetanus  PERTINENT  PMH / PSH: ***  OBJECTIVE:   There were no vitals taken for this visit.  ***  ASSESSMENT/PLAN:   No problem-specific Assessment & Plan notes found for this encounter.     Matilde Haymaker, MD [place]

## 2020-01-29 ENCOUNTER — Encounter: Payer: Self-pay | Admitting: Family Medicine

## 2020-01-31 ENCOUNTER — Ambulatory Visit (HOSPITAL_COMMUNITY)
Admission: EM | Admit: 2020-01-31 | Discharge: 2020-01-31 | Disposition: A | Discharge status: Home / Self Care | Payer: BLUE CROSS/BLUE SHIELD | Attending: Family Medicine | Admitting: Family Medicine

## 2020-01-31 ENCOUNTER — Other Ambulatory Visit: Payer: Self-pay

## 2020-01-31 ENCOUNTER — Encounter (HOSPITAL_COMMUNITY): Payer: Self-pay

## 2020-01-31 DIAGNOSIS — Z833 Family history of diabetes mellitus: Secondary | ICD-10-CM | POA: Insufficient documentation

## 2020-01-31 DIAGNOSIS — I10 Essential (primary) hypertension: Secondary | ICD-10-CM | POA: Diagnosis not present

## 2020-01-31 DIAGNOSIS — R21 Rash and other nonspecific skin eruption: Secondary | ICD-10-CM | POA: Diagnosis not present

## 2020-01-31 DIAGNOSIS — Z79899 Other long term (current) drug therapy: Secondary | ICD-10-CM | POA: Insufficient documentation

## 2020-01-31 DIAGNOSIS — F17210 Nicotine dependence, cigarettes, uncomplicated: Secondary | ICD-10-CM | POA: Diagnosis not present

## 2020-01-31 DIAGNOSIS — Z20822 Contact with and (suspected) exposure to covid-19: Secondary | ICD-10-CM | POA: Insufficient documentation

## 2020-01-31 DIAGNOSIS — Z8249 Family history of ischemic heart disease and other diseases of the circulatory system: Secondary | ICD-10-CM | POA: Diagnosis not present

## 2020-01-31 DIAGNOSIS — R112 Nausea with vomiting, unspecified: Secondary | ICD-10-CM | POA: Diagnosis not present

## 2020-01-31 DIAGNOSIS — R519 Headache, unspecified: Secondary | ICD-10-CM | POA: Diagnosis not present

## 2020-01-31 DIAGNOSIS — Z202 Contact with and (suspected) exposure to infections with a predominantly sexual mode of transmission: Secondary | ICD-10-CM

## 2020-01-31 MED ORDER — CLOTRIMAZOLE-BETAMETHASONE 1-0.05 % EX CREA: TOPICAL_CREAM | CUTANEOUS | 0 refills | Status: DC

## 2020-01-31 MED ORDER — IBUPROFEN 800 MG PO TABS: 800.0000 mg | ORAL_TABLET | Freq: Three times a day (TID) | ORAL | 0 refills | Status: DC

## 2020-01-31 MED ORDER — ONDANSETRON HCL 8 MG PO TABS: 8.0000 mg | ORAL_TABLET | Freq: Three times a day (TID) | ORAL | 0 refills | Status: DC | PRN

## 2020-01-31 NOTE — ED Provider Notes (Signed)
[place]    CSN: [id number] Arrival date & time: 01/31/20  1244      History   Chief Complaint Chief Complaint  Patient presents with  . Headache  . Nausea    HPI Gerald Mills is a 39 y.o. male.   HPI  Patient is here for 2 problems First he is like to be tested for STDs.  He has regular unprotected sexual relations Second he would like to be treated for headache.  He has had a headache for the last week.  He thinks it is from stress.  He states he also has nausea.  No visual symptoms.  No numbness or weakness in arms or legs.  No history of migraines. Vital signs are stable today No recent infection No head injury Also complains of a rash in his groin.  He states it itches.  Past Medical History:  Diagnosis Date  . Hypertension     Patient Active Problem List   Diagnosis Date Noted  . Hypertension 11/27/2019  . Swelling of lower extremity 11/27/2019  . Current smoker 11/27/2019  . Routine screening for STI (sexually transmitted infection) 11/27/2019  . Screening for diabetes mellitus 11/27/2019    History reviewed. No pertinent surgical history.     Home Medications    Prior to Admission medications   Medication Sig Start Date End Date Taking? Authorizing Provider  amLODipine (NORVASC) 10 MG tablet 1 tablet po q day for blood pressure 11/27/19   Matilde Haymaker, MD  benzoyl peroxide 5 % external liquid Apply topically 2 (two) times daily. 02/27/18   Wurst, [place], PA-C  clotrimazole-betamethasone (LOTRISONE) cream Apply to affected area 2 times daily prn 01/31/20   Raylene Everts, MD  ibuprofen (ADVIL) 800 MG tablet Take 1 tablet (800 mg total) by mouth 3 (three) times daily. 01/31/20   Raylene Everts, MD  Misc. Devices MISC MEDICAL COMPRESSION STOCKINGS Diagnosis: lower extremity venous stasis 07/19/18   Vanessa Kick, MD  ondansetron (ZOFRAN) 8 MG tablet Take 1 tablet (8 mg total) by mouth every 8 (eight) hours as needed for nausea or  vomiting. 01/31/20   Raylene Everts, MD  escitalopram (LEXAPRO) 10 MG tablet Take 1 tablet (10 mg total) by mouth daily. 10/29/17 08/25/19  Robyn Haber, MD  hydrochlorothiazide (HYDRODIURIL) 25 MG tablet Take 1 tablet (25 mg total) by mouth daily. 04/04/17 08/25/19  Marney Setting, NP  lisinopril-hydrochlorothiazide (ZESTORETIC) 10-12.5 MG tablet Take 1 tablet by mouth daily. 05/21/19 08/25/19  Sharion Balloon, NP    Family History Family History  Problem Relation Age of Onset  . Diabetes Mother   . Hypertension Mother   . Healthy Father   . Hypertension Father     Social History Social History   Tobacco Use  . Smoking status: Current Every Day Smoker    Packs/day: 0.50    Years: 24.00    Pack years: 12.00    Types: Cigarettes  . Smokeless tobacco: Never Used  Substance Use Topics  . Alcohol use: No  . Drug use: No     Allergies   Patient has no known allergies.   Review of Systems Review of Systems  Eyes: Negative for visual disturbance.  Genitourinary: Negative for discharge.  Skin: Positive for rash.  Neurological: Positive for headaches. Negative for dizziness and light-headedness.     Physical Exam Triage Vital Signs ED Triage Vitals  Enc Vitals Group     BP 01/31/20 1326 (!) 143/83  Pulse Rate 01/31/20 1326 67     Resp 01/31/20 1326 18     Temp 01/31/20 1326 98.2 F (36.8 C)     Temp Source 01/31/20 1326 Oral     SpO2 01/31/20 1326 98 %     Weight --      Height --      Head Circumference --      Peak Flow --      Pain Score 01/31/20 1324 6     Pain Loc --      Pain Edu? --      Excl. in [place]? --    No data found.  Updated Vital Signs BP (!) 143/83 (BP Location: Right Arm)   Pulse 67   Temp 98.2 F (36.8 C) (Oral)   Resp 18   SpO2 98%     Physical Exam Constitutional:      General: He is not in acute distress.    Appearance: Normal appearance. He is well-developed and normal weight.  HENT:     Head: Normocephalic and  atraumatic.     Mouth/Throat:     Comments: Mask in place Eyes:     Conjunctiva/sclera: Conjunctivae normal.     Pupils: Pupils are equal, round, and reactive to light.  Cardiovascular:     Rate and Rhythm: Normal rate.  Pulmonary:     Effort: Pulmonary effort is normal. No respiratory distress.  Musculoskeletal:        General: Normal range of motion.     Cervical back: Normal range of motion.  Skin:    General: Skin is warm and dry.     Comments: Rash in both inner trigone of the groin, scarring from old folliculitis, hyperpigmentation consistent with Candida  Neurological:     General: No focal deficit present.     Mental Status: He is alert.  Psychiatric:        Mood and Affect: Mood normal.        Behavior: Behavior normal.      UC Treatments / Results  Labs (all labs ordered are listed, but only abnormal results are displayed) Labs Reviewed  SARS CORONAVIRUS 2 (TAT 6-24 HRS)  CYTOLOGY, (ORAL, ANAL, URETHRAL) ANCILLARY ONLY    EKG   Radiology No results found.  Procedures Procedures (including critical care time)  Medications Ordered in UC Medications - No data to display  Initial Impression / Assessment and Plan / UC Course  I have reviewed the triage vital signs and the nursing notes.  Pertinent labs & imaging results that were available during my care of the patient were reviewed by me and considered in my medical decision making (see chart for details).      Patient requests that treat him for STDs.  I told him that with no symptoms, it was not appropriate.  I will test, and treat if positive cultures  we will treat with Lotrisone for the rash.  Use sparingly 2 times a day for a week.  No longer than 2 Patient has a headache and nausea.  We will do Covid testing.  Patient does not have a known exposure, fever chills, body aches, cough or shortness of breath Final Clinical Impressions(s) / UC Diagnoses   Final diagnoses:  Possible exposure to STD   Non-intractable vomiting with nausea, unspecified vomiting type  Rash and nonspecific skin eruption     Discharge Instructions     Check MyChart for the test results You will be called if the results  are positive Use condoms Take the ibuprofen for headache Take zofran as needed nausea   ED Prescriptions    Medication Sig Dispense Auth. Provider   clotrimazole-betamethasone (LOTRISONE) cream Apply to affected area 2 times daily prn 30 g Raylene Everts, MD   ondansetron (ZOFRAN) 8 MG tablet Take 1 tablet (8 mg total) by mouth every 8 (eight) hours as needed for nausea or vomiting. 12 tablet Raylene Everts, MD   ibuprofen (ADVIL) 800 MG tablet Take 1 tablet (800 mg total) by mouth 3 (three) times daily. 21 tablet Raylene Everts, MD     PDMP not reviewed this encounter.   Raylene Everts, MD 01/31/20 1434

## 2020-01-31 NOTE — Discharge Instructions (Addendum)
Check MyChart for the test results You will be called if the results are positive Use condoms Take the ibuprofen for headache Take zofran as needed nausea

## 2020-01-31 NOTE — ED Triage Notes (Signed)
Pt states he has had a headache and nausea x 2 weeks.pt states he has a rash between his legs.  Pt states he would like to be tested for STDs.

## 2020-02-01 LAB — CYTOLOGY, (ORAL, ANAL, URETHRAL) ANCILLARY ONLY
Chlamydia: NEGATIVE
Neisseria Gonorrhea: NEGATIVE
Trichomonas: NEGATIVE

## 2020-02-01 LAB — SARS CORONAVIRUS 2 (TAT 6-24 HRS): SARS Coronavirus 2: NEGATIVE

## 2020-02-07 ENCOUNTER — Encounter: Payer: Self-pay | Admitting: Family Medicine

## 2020-02-07 ENCOUNTER — Other Ambulatory Visit: Payer: Self-pay

## 2020-02-07 ENCOUNTER — Ambulatory Visit (INDEPENDENT_AMBULATORY_CARE_PROVIDER_SITE_OTHER): Payer: BLUE CROSS/BLUE SHIELD | Admitting: Family Medicine

## 2020-02-07 DIAGNOSIS — N529 Male erectile dysfunction, unspecified: Secondary | ICD-10-CM

## 2020-02-07 DIAGNOSIS — I10 Essential (primary) hypertension: Secondary | ICD-10-CM

## 2020-02-07 DIAGNOSIS — B356 Tinea cruris: Secondary | ICD-10-CM

## 2020-02-07 MED ORDER — SILDENAFIL CITRATE 25 MG PO TABS: 25.0000 mg | ORAL_TABLET | Freq: Every day | ORAL | 5 refills | Status: DC | PRN

## 2020-02-07 MED ORDER — AMLODIPINE BESYLATE 10 MG PO TABS: ORAL_TABLET | ORAL | 3 refills | Status: DC

## 2020-02-07 MED ORDER — CLOTRIMAZOLE 1 % EX CREA: 1.0000 "application " | TOPICAL_CREAM | Freq: Two times a day (BID) | CUTANEOUS | 0 refills | Status: DC

## 2020-02-07 NOTE — Progress Notes (Signed)
SUBJECTIVE:   CHIEF COMPLAINT / HPI:   HTN He reports that he ran out of amlodipine about 2 days ago.  Has not taken any blood pressure medication in the past 2 days.  He notes that he had recently doubled up on his amlodipine because he noticed some lower extremity swelling and he thought it might improve if he increased his blood pressure medication.  He noticed that his leg swelling improved after doubling his blood pressure medication.  Erectile dysfunction He notes that, while he continues to have normal morning erections, he has difficulty maintaining a firm erection during intercourse.  He would like to know if it would be helpful to start Viagra at this time.  He has no history of low blood pressure or heart problems.  He notes that he is also tried over-the-counter medication to help the quality of his erection and was interested to know if that could be the cause of a headache.  He noted that he may have a small issue with premature ejaculation although his main concern was erectile dysfunction.  Tinea cruris He has been noticing an itchy, mildly pink rash around the sides of his groin on both sides.  He started applying over-the-counter Lotrimin about 2 weeks ago.  He had been applying the Lotrimin cream inconsistently until about 1 week ago when he was seen at urgent care and given a new prescription for clotrimazole with betamethasone which she has been using consistently twice a day for the past week.  He has not noted any significant improvement in the rash yet and wants to know if he should use a different treatment.  PERTINENT  PMH / PSH: Hypertension  OBJECTIVE:   BP (!) 152/96   Pulse (!) 58   Ht 6\' 4"  (1.93 m)   Wt 277 lb 9.6 oz (125.9 kg)   SpO2 100%   BMI 33.79 kg/m    General: Well-appearing 39 year old man.  No acute distress.  Resting comfortably in the chair in the exam room. Pelvis: Normal male external genitalia.  Mild erythema and maceration noted in  the crease between his scrotum and thigh bilaterally.  No significant excoriations noted.  No evidence of purulence no ulceration noted.  ASSESSMENT/PLAN:   Hypertension Elevated blood pressure today although this is technically off of medication.  It is still difficult to know if his blood pressure is well controlled well on amlodipine.  His amlodipine was refilled.  He was encouraged to check his blood pressure at home daily for the next week and to send a message with his daily readings after 1 week.  Based on those readings, we will consider starting it an additional blood pressure medication. -Amlodipine refilled -Monitor blood pressure as noted above  Tinea cruris History and physical exam consistent with tinea cruris.  The fails improvement of the rash may be related to the steroid component of the cream that he is currently using although this should be helpful for the pruritus.  I recommend that he continue using the clotrimazole for at least another 2 weeks and to be diligent with twice daily applications.  Encouraged to send a MyChart message if this has not shown significant improvement in the next 2 weeks.  Erectile dysfunction He continues having morning erections.  Not currently using an SSRI.  No known heart history or concern for hypotension. -Sildenafil 25 mg prescribed -Advised regarding precautions and appropriate use of sildenafil     Matilde Haymaker, MD [facility]

## 2020-02-07 NOTE — Assessment & Plan Note (Signed)
He continues having morning erections.  Not currently using an SSRI.  No known heart history or concern for hypotension. -Sildenafil 25 mg prescribed -Advised regarding precautions and appropriate use of sildenafil

## 2020-02-07 NOTE — Assessment & Plan Note (Addendum)
History and physical exam consistent with tinea cruris.  The fails improvement of the rash may be related to the steroid component of the cream that he is currently using although this should be helpful for the pruritus.  I recommend that he continue using the clotrimazole for at least another 2 weeks and to be diligent with twice daily applications.  Encouraged to send a MyChart message if this has not shown significant improvement in the next 2 weeks.

## 2020-02-07 NOTE — Patient Instructions (Addendum)
Hypertension: For your high blood pressure, take your amlodipine 10 mg every day.  Please measure your blood pressure once a day for a week and send me a message with your blood pressure readings in 1 week.  I will let you know if we need to start a second blood pressure medicine.  Fungal infection: I think that clotrimazole is the best medication for you to be on.  I have put in a prescription for you to get the clotrimazole alone without the steroid.  I think that the steroid may be worsening her symptoms a little bit.  I recommend that you continue using the clotrimazole twice daily for the next 2 weeks.  Let me know if this has not led to any improvement we can think about a different medication.  I think you just taking a little bit of time to get over this infection but it should resolve with this treatment.  I have also put in a prescription for sildenafil.  This is the lowest dose.  There is a small quantity of pills because this can be an expensive medication.  It is okay to double the dose if a single pill is not sufficient.

## 2020-02-07 NOTE — Assessment & Plan Note (Signed)
Elevated blood pressure today although this is technically off of medication.  It is still difficult to know if his blood pressure is well controlled well on amlodipine.  His amlodipine was refilled.  He was encouraged to check his blood pressure at home daily for the next week and to send a message with his daily readings after 1 week.  Based on those readings, we will consider starting it an additional blood pressure medication. -Amlodipine refilled -Monitor blood pressure as noted above

## 2020-05-28 ENCOUNTER — Encounter: Payer: Self-pay | Admitting: Family Medicine

## 2020-05-28 ENCOUNTER — Other Ambulatory Visit (HOSPITAL_COMMUNITY)
Admission: RE | Admit: 2020-05-28 | Discharge: 2020-05-28 | Disposition: A | Discharge status: Home / Self Care | Payer: BLUE CROSS/BLUE SHIELD | Source: Ambulatory Visit | Attending: Family Medicine | Admitting: Family Medicine

## 2020-05-28 ENCOUNTER — Other Ambulatory Visit: Payer: Self-pay

## 2020-05-28 ENCOUNTER — Ambulatory Visit (INDEPENDENT_AMBULATORY_CARE_PROVIDER_SITE_OTHER): Payer: BLUE CROSS/BLUE SHIELD | Admitting: Family Medicine

## 2020-05-28 VITALS — BP 140/70 | HR 53 | Ht 71.0 in | Wt 277.5 lb

## 2020-05-28 DIAGNOSIS — Z113 Encounter for screening for infections with a predominantly sexual mode of transmission: Secondary | ICD-10-CM | POA: Insufficient documentation

## 2020-05-28 DIAGNOSIS — K1370 Unspecified lesions of oral mucosa: Secondary | ICD-10-CM

## 2020-05-28 DIAGNOSIS — B356 Tinea cruris: Secondary | ICD-10-CM

## 2020-05-28 DIAGNOSIS — I10 Essential (primary) hypertension: Secondary | ICD-10-CM | POA: Diagnosis not present

## 2020-05-28 DIAGNOSIS — F17200 Nicotine dependence, unspecified, uncomplicated: Secondary | ICD-10-CM | POA: Diagnosis not present

## 2020-05-28 DIAGNOSIS — G4733 Obstructive sleep apnea (adult) (pediatric): Secondary | ICD-10-CM | POA: Diagnosis not present

## 2020-05-28 DIAGNOSIS — K1379 Other lesions of oral mucosa: Secondary | ICD-10-CM

## 2020-05-28 DIAGNOSIS — Z87898 Personal history of other specified conditions: Secondary | ICD-10-CM

## 2020-05-28 MED ORDER — CLOTRIMAZOLE 1 % EX CREA: 1.0000 "application " | TOPICAL_CREAM | Freq: Two times a day (BID) | CUTANEOUS | 0 refills | Status: DC

## 2020-05-28 NOTE — Progress Notes (Addendum)
SUBJECTIVE:   CHIEF COMPLAINT / HPI:   Hypertension No episodes of lightheadedness or dizziness.  He has been taking his amlodipine 10 mg daily and not missing doses.  Tinea cruris He reports that he had good improvement of his jock itch with the clotrimazole cream prescribed at the last visit.  It did ultimately resolve but has returned now that he is continued going to the gym.  He requested a refill of the clotrimazole.  Dentist He requested a list of dentists so that he can keep up on his dental health.  Screening for STI He continues to be sexually active and has requested STI screening for GC/chlamydia.  He denies any penile pain, discharge.   OSA He has been suspicious for some time that he might have obstructive sleep apnea and requests to be tested.  He notes that he does snore loudly at night and has been told that he does occasionally stop breathing at night.  His BMI is over 35.    Linea alba He notes that he has a white line along the inside of his cheeks bilaterally.  He has noticed this for the past several weeks and is suspicious that he might have thrush.  The white lesion does not cause him any discomfort.  He does smoke, he has no history of cancer.  Of note, his PHQ-9 was initially filled out with a very high score and concern for SI.  After discussing with this with Mr. Justen, he realized that he had filled out the survey backwards thinking that the 3 was the lowest score.  We filled out the survey together noted that he has very few symptoms and no SI.  PERTINENT  PMH / PSH: Current smoker  OBJECTIVE:   BP 140/70   Pulse (!) 53   Ht 5\' 11"  (1.803 m)   Wt 277 lb 8 oz (125.9 kg)   SpO2 97%   BMI 38.70 kg/m    General: Alert and cooperative and appears to be in no acute distress HEENT: Mildly dry mucous membranes.  A white linear lesion was noted along his dental line bilaterally.  No pain with scraping with a tongue depressor.  Unable to remove any white  material.  No evidence of bleeding or significant variegation.  No plaques or lesions noted over tongue or oropharynx.  No cervical lymphadenopathy. Cardio: Normal S1 and S2, no S3 or S4. Rhythm is regular. No murmurs or rubs.   Pulm: Clear to auscultation bilaterally, no crackles, wheezing, or diminished breath sounds. Normal respiratory effort Abdomen: Bowel sounds normal. Abdomen soft. Extremities: No peripheral edema. Warm/ well perfused.  Strong radial pulse.   ASSESSMENT/PLAN:   Hypertension Borderline control currently with amlodipine 10 mg daily.  We discussed shooting for a slightly lower blood pressure goal due to his history of the ED which indicates he likely has significant vascular disease.  He was agreeable to starting a new medication if necessary.  We will move forward with ambulatory blood pressure monitoring for better assessment of his ambulatory blood pressure.  If elevated, will consider chlorthalidone or ARB.  Current smoker Not interested in cessation today.  He was encouraged to stop smoking.  Routine screening for STI (sexually transmitted infection) -Follow-up urine GC/chlamydia, trichomonas  Tinea cruris -Clotrimazole cream refilled  History of snoring STOP-BANG criteria show high risk for OSA. -Placed ambulatory referral for sleep study  Linea alba of oral mucosa He was informed that his oral lesion was very likely benign  and nonconcerning.  He was encouraged to stop smoking.  He is also encouraged to return to care if he noticed any changes in his white line or if he grew uncomfortable.  He was informed that there is no need to treat as thrush.  This does not appear to be thrush.  Photo in chart for future reference.   The Covid vaccine was offered to this patient but he declined.  Information was provided.  Matilde Haymaker, MD [place]

## 2020-05-28 NOTE — Assessment & Plan Note (Signed)
Not interested in cessation today.  He was encouraged to stop smoking.

## 2020-05-28 NOTE — Assessment & Plan Note (Signed)
-  Clotrimazole cream refilled

## 2020-05-28 NOTE — Addendum Note (Signed)
Addended by: Christen Bame D on: 05/28/2020 10:28 AM   Modules accepted: Orders

## 2020-05-28 NOTE — Assessment & Plan Note (Signed)
STOP-BANG criteria show high risk for OSA. -Placed ambulatory referral for sleep study

## 2020-05-28 NOTE — Assessment & Plan Note (Signed)
Borderline control currently with amlodipine 10 mg daily.  We discussed shooting for a slightly lower blood pressure goal due to his history of the ED which indicates he likely has significant vascular disease.  He was agreeable to starting a new medication if necessary.  We will move forward with ambulatory blood pressure monitoring for better assessment of his ambulatory blood pressure.  If elevated, will consider chlorthalidone or ARB.

## 2020-05-28 NOTE — Assessment & Plan Note (Signed)
-  Follow-up urine GC/chlamydia, trichomonas

## 2020-05-28 NOTE — Patient Instructions (Addendum)
Hypertension: We will going to wait before starting another blood pressure medication.  I am going to send you to our pharmacist, Dr. Valentina Lucks we will get you set up with an ambulatory blood pressure monitor.  This will help give [place] a better idea of your true blood pressure.  If possible our measurement here in the clinic is a little elevated.  This will be helpful measurement because it may save [place] starting a new blood pressure medicine.  Dentist: Please see the list provided for a list of dentists in the area.  You are also free to look around for your own dentist.  Screening for STI:  We will get a urine sample today I will let you know if there are any abnormalities.  Obstructive sleep apnea: I have put in a referral for you to be seen by sleep medicine for sleep study.  You should get a call to set up this appointment in the next week or 2.  Please him know if you have not received a call.  Jock itch: I have put in a refill of this medication.  I recommend that you use it twice a day for at least a week.  Let me know if it does not improve.

## 2020-05-28 NOTE — Assessment & Plan Note (Signed)
He was informed that his oral lesion was very likely benign and nonconcerning.  He was encouraged to stop smoking.  He is also encouraged to return to care if he noticed any changes in his white line or if he grew uncomfortable.  He was informed that there is no need to treat as thrush.  This does not appear to be thrush.  Photo in chart for future reference.

## 2020-05-29 LAB — URINE CYTOLOGY ANCILLARY ONLY
Chlamydia: NEGATIVE
Comment: NEGATIVE
Comment: NEGATIVE
Comment: NORMAL
Neisseria Gonorrhea: NEGATIVE
Trichomonas: NEGATIVE

## 2020-05-29 LAB — HEPATITIS C ANTIBODY: Hep C Virus Ab: <0.1 s/co ratio (ref 0.0–0.9)

## 2020-05-30 NOTE — Progress Notes (Signed)
All of your testing returned normal and unremarkable.  Please let me know if you have any questions.  Matilde Haymaker, MD

## 2020-06-03 ENCOUNTER — Telehealth: Payer: Self-pay | Admitting: *Deleted

## 2020-06-03 NOTE — Telephone Encounter (Signed)
LMOVM for pt to return call.  Brendi Mccarroll, CMA  

## 2020-06-03 NOTE — Telephone Encounter (Signed)
Patient states that his mouth is now really dry and he would like the treatment that he and Dr. Pilar Plate discussed for his "Thrush"   Christen Bame, CMA

## 2020-06-03 NOTE — Telephone Encounter (Signed)
This was discussed at his last visit.  No treatment was needed at that time.  If anything has changed since last visit, he can be seen again.  I do not think any treatment over the phone would be appropriate at this time.  Matilde Haymaker, MD

## 2020-06-11 ENCOUNTER — Other Ambulatory Visit: Payer: Self-pay

## 2020-06-11 ENCOUNTER — Encounter (HOSPITAL_COMMUNITY): Payer: Self-pay

## 2020-06-11 ENCOUNTER — Ambulatory Visit (HOSPITAL_COMMUNITY)
Admission: EM | Admit: 2020-06-11 | Discharge: 2020-06-11 | Disposition: A | Discharge status: Home / Self Care | Payer: BLUE CROSS/BLUE SHIELD | Attending: Physician Assistant | Admitting: Physician Assistant

## 2020-06-11 DIAGNOSIS — K1370 Unspecified lesions of oral mucosa: Secondary | ICD-10-CM | POA: Diagnosis not present

## 2020-06-11 DIAGNOSIS — I10 Essential (primary) hypertension: Secondary | ICD-10-CM

## 2020-06-11 MED ORDER — NYSTATIN 100000 UNIT/ML MT SUSP: 500000.0000 [IU] | Freq: Four times a day (QID) | OROMUCOSAL | 0 refills | Status: DC

## 2020-06-11 NOTE — ED Provider Notes (Signed)
[name]    CSN: [id number] Arrival date & time: 06/11/20  5361      History   Chief Complaint Chief Complaint  Patient presents with  . mouth infection    HPI Gerald Mills is a 39 y.o. male.   Patient presents for evaluation of oral lesions.  Emergency been present for quite some time.  They are not painful.  He notes white patches inside of his cheeks.  He has seen his primary care about it.  There was no treatments prescribed.  He is concerned sometimes he believes there is foul odor or bad breath.  He denies pain, difficulty breathing.   He reports he denies take his blood pressure medication this morning.  Denies chest pain, shortness of breath, headache, dizziness.     Past Medical History:  Diagnosis Date  . Hypertension     Patient Active Problem List   Diagnosis Date Noted  . History of snoring 05/28/2020  . 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 Tinea cruris 02/07/2020  . Erectile dysfunction 02/07/2020  . Hypertension 11/27/2019  . Swelling of lower extremity 11/27/2019  . Current smoker 11/27/2019  . Routine screening for STI (sexually transmitted infection) 11/27/2019  . Screening for diabetes mellitus 11/27/2019    History reviewed. No pertinent surgical history.     Home Medications    Prior to Admission medications   Medication Sig Start Date End Date Taking? Authorizing Provider  amLODipine (NORVASC) 10 MG tablet 1 tablet po q day for blood pressure 02/07/20   Matilde Haymaker, MD  benzoyl peroxide 5 % external liquid Apply topically 2 (two) times daily. 02/27/18   Wurst, [place], PA-C  clotrimazole (LOTRIMIN) 1 % cream Apply 1 application topically 2 (two) times daily. 05/28/20   Matilde Haymaker, MD  clotrimazole-betamethasone (LOTRISONE) cream Apply to affected area 2 times daily prn 01/31/20   Raylene Everts, MD  nystatin (MYCOSTATIN) 100000 UNIT/ML suspension Take 5 mLs (500,000 Units total) by mouth 4 (four) times daily. 06/11/20    Armstrong Creasy, Marguerita Beards, PA-C  ondansetron (ZOFRAN) 8 MG tablet Take 1 tablet (8 mg total) by mouth every 8 (eight) hours as needed for nausea or vomiting. 01/31/20   Raylene Everts, MD  sildenafil (VIAGRA) 25 MG tablet Take 1 tablet (25 mg total) by mouth daily as needed for erectile dysfunction. 02/07/20   Matilde Haymaker, MD  escitalopram (LEXAPRO) 10 MG tablet Take 1 tablet (10 mg total) by mouth daily. 10/29/17 08/25/19  Robyn Haber, MD  hydrochlorothiazide (HYDRODIURIL) 25 MG tablet Take 1 tablet (25 mg total) by mouth daily. 04/04/17 08/25/19  Marney Setting, NP  lisinopril-hydrochlorothiazide (ZESTORETIC) 10-12.5 MG tablet Take 1 tablet by mouth daily. 05/21/19 08/25/19  Sharion Balloon, NP    Family History Family History  Problem Relation Age of Onset  . Diabetes Mother   . Hypertension Mother   . Healthy Father   . Hypertension Father     Social History Social History   Tobacco Use  . Smoking status: Current Every Day Smoker    Packs/day: 0.50    Years: 24.00    Pack years: 12.00    Types: Cigarettes  . Smokeless tobacco: Never Used  Vaping Use  . Vaping Use: Never used  Substance Use Topics  . Alcohol use: Yes  . Drug use: Not on file     Allergies   Patient has no known allergies.   Review of Systems Review of Systems  Physical Exam Triage Vital Signs ED Triage Vitals  Enc Vitals Group     BP 06/11/20 0837 (!) 180/147     Pulse Rate 06/11/20 0837 (!) 56     Resp 06/11/20 0837 18     Temp 06/11/20 0837 97.8 F (36.6 C)     Temp Source 06/11/20 0837 Oral     SpO2 06/11/20 0837 99 %     Weight 06/11/20 0840 278 lb (126.1 kg)     Height --      Head Circumference --      Peak Flow --      Pain Score 06/11/20 0836 0     Pain Loc --      Pain Edu? --      Excl. in [place]? --    No data found.  Updated Vital Signs BP (!) 177/117   Pulse (!) 48   Temp 97.8 F (36.6 C) (Oral)   Resp 18   Wt 278 lb (126.1 kg)   SpO2 99%   BMI 38.77 kg/m   Visual  Acuity Right Eye Distance:   Left Eye Distance:   Bilateral Distance:    Right Eye Near:   Left Eye Near:    Bilateral Near:     Physical Exam Vitals and nursing note reviewed.  Constitutional:      General: He is not in acute distress.    Appearance: He is well-developed. He is not ill-appearing.  HENT:     Head: Normocephalic and atraumatic.     Mouth/Throat:     Comments: Bilateral linear white/yellow patches on the inner cheek mucosa.  Does not readily scrape off.   nontender.  No other lesions or erythema.  No lesions on the tongue.  Oropharynx clear. Cardiovascular:     Rate and Rhythm: Normal rate and regular rhythm.     Heart sounds: No murmur heard.   Pulmonary:     Effort: Pulmonary effort is normal. No respiratory distress.     Breath sounds: Normal breath sounds.  Musculoskeletal:     Cervical back: Neck supple.  Skin:    General: Skin is warm and dry.  Neurological:     Mental Status: He is alert.      UC Treatments / Results  Labs (all labs ordered are listed, but only abnormal results are displayed) Labs Reviewed - No data to display  EKG   Radiology No results found.  Procedures Procedures (including critical care time)  Medications Ordered in UC Medications - No data to display  Initial Impression / Assessment and Plan / UC Course  I have reviewed the triage vital signs and the nursing notes.  Pertinent labs & imaging results that were available during my care of the patient were reviewed by me and considered in my medical decision making (see chart for details).     #Oral mucosal lesions #Elevated blood pressure Patient is a 39 year old with history of hypertension presenting with oral mucosal lesions.  Unlikely this is thrush however will trial nystatin and have him follow-up with his primary care.  Unclear etiology do believe he needs to have dental follow-up and discussed this with the patient.  Asymptomatic from blood pressure  standpoint.  Patient has not taken his medicine today.  Instructed to restart his medicine.  Patient to follow-up with primary care.  Return emergency department cautions discussed.  Patient verbalized understanding plan of care. Final Clinical Impressions(s) / UC Diagnoses   Final diagnoses:  Oral mucosal lesion  Elevated blood pressure reading in office with diagnosis of hypertension     Discharge Instructions     Resume your blood pressure medication  Try the mouth wash  Schedule follow up with your PCP for your blood pressure  You should see a dentist about the oral lesions and may also follow up with your PCP  If chest pain, shortness of breath, dizziness, severe headache, go to the Emergency department      ED Prescriptions    Medication Sig Dispense Auth. Provider   nystatin (MYCOSTATIN) 100000 UNIT/ML suspension Take 5 mLs (500,000 Units total) by mouth 4 (four) times daily. 60 mL Znya Albino, Marguerita Beards, PA-C     PDMP not reviewed this encounter.   Purnell Shoemaker, PA-C 06/11/20 [phone]

## 2020-06-11 NOTE — ED Triage Notes (Signed)
Pt is here with a mouth infection that started 3 weeks ago, pt has not taken anything to relieve discomfort.

## 2020-06-11 NOTE — Discharge Instructions (Signed)
Resume your blood pressure medication  Try the mouth wash  Schedule follow up with your PCP for your blood pressure  You should see a dentist about the oral lesions and may also follow up with your PCP  If chest pain, shortness of breath, dizziness, severe headache, go to the Emergency department

## 2020-06-16 ENCOUNTER — Ambulatory Visit: Payer: BLUE CROSS/BLUE SHIELD | Admitting: Pharmacist

## 2020-07-16 ENCOUNTER — Ambulatory Visit: Payer: BLUE CROSS/BLUE SHIELD | Admitting: Family Medicine

## 2020-07-23 ENCOUNTER — Ambulatory Visit: Payer: BLUE CROSS/BLUE SHIELD | Admitting: Family Medicine

## 2020-07-25 ENCOUNTER — Ambulatory Visit (HOSPITAL_COMMUNITY)
Admission: EM | Admit: 2020-07-25 | Discharge: 2020-07-25 | Disposition: A | Discharge status: Home / Self Care | Payer: BLUE CROSS/BLUE SHIELD | Attending: Emergency Medicine | Admitting: Emergency Medicine

## 2020-07-25 ENCOUNTER — Other Ambulatory Visit: Payer: Self-pay

## 2020-07-25 ENCOUNTER — Encounter (HOSPITAL_COMMUNITY): Payer: Self-pay

## 2020-07-25 DIAGNOSIS — R519 Headache, unspecified: Secondary | ICD-10-CM | POA: Diagnosis not present

## 2020-07-25 DIAGNOSIS — M545 Low back pain, unspecified: Secondary | ICD-10-CM

## 2020-07-25 DIAGNOSIS — Z20822 Contact with and (suspected) exposure to covid-19: Secondary | ICD-10-CM | POA: Diagnosis not present

## 2020-07-25 LAB — SARS CORONAVIRUS 2 (TAT 6-24 HRS): SARS Coronavirus 2: NEGATIVE

## 2020-07-25 MED ORDER — IBUPROFEN 800 MG PO TABS: 800.0000 mg | ORAL_TABLET | Freq: Three times a day (TID) | ORAL | 0 refills | Status: DC

## 2020-07-25 MED ORDER — FLUTICASONE PROPIONATE 50 MCG/ACT NA SUSP: 1.0000 | Freq: Every day | NASAL | 0 refills | Status: DC

## 2020-07-25 NOTE — ED Provider Notes (Signed)
[place]    CSN: [id number] Arrival date & time: 07/25/20  1021      History   Chief Complaint Chief Complaint  Patient presents with  . multiple sx    HPI Gerald Mills is a 39 y.o. male presenting today for evaluation of headache and back pain after Covid exposure.  Patient reports over the past 3 days he has had headache, back pain as well as some mild lower abdominal discomfort.  Reports mild rhinorrhea, but minimal other URI symptoms.  Denies cough or sore throat.  Denies fevers.  Maintaining appetite.  Close Covid exposure in the household.   HPI  Past Medical History:  Diagnosis Date  . Hypertension     Patient Active Problem List   Diagnosis Date Noted  . History of snoring 05/28/2020  . 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 Tinea cruris 02/07/2020  . Erectile dysfunction 02/07/2020  . Hypertension 11/27/2019  . Swelling of lower extremity 11/27/2019  . Current smoker 11/27/2019  . Routine screening for STI (sexually transmitted infection) 11/27/2019  . Screening for diabetes mellitus 11/27/2019    History reviewed. No pertinent surgical history.     Home Medications    Prior to Admission medications   Medication Sig Start Date End Date Taking? Authorizing Provider  amLODipine (NORVASC) 10 MG tablet 1 tablet po q day for blood pressure 02/07/20   Matilde Haymaker, MD  fluticasone [facility]) 50 MCG/ACT nasal spray Place 1-2 sprays into both nostrils daily for 7 days. 07/25/20 08/01/20  Esteven Overfelt C, PA-C  ibuprofen (ADVIL) 800 MG tablet Take 1 tablet (800 mg total) by mouth 3 (three) times daily. 07/25/20   Ashtyn Freilich C, PA-C  sildenafil (VIAGRA) 25 MG tablet Take 1 tablet (25 mg total) by mouth daily as needed for erectile dysfunction. 02/07/20   Matilde Haymaker, MD  escitalopram (LEXAPRO) 10 MG tablet Take 1 tablet (10 mg total) by mouth daily. 10/29/17 08/25/19  Robyn Haber, MD  hydrochlorothiazide (HYDRODIURIL) 25 MG tablet Take 1  tablet (25 mg total) by mouth daily. 04/04/17 08/25/19  Marney Setting, NP  lisinopril-hydrochlorothiazide (ZESTORETIC) 10-12.5 MG tablet Take 1 tablet by mouth daily. 05/21/19 08/25/19  Sharion Balloon, NP    Family History Family History  Problem Relation Age of Onset  . Diabetes Mother   . Hypertension Mother   . Healthy Father   . Hypertension Father     Social History Social History   Tobacco Use  . Smoking status: Current Every Day Smoker    Packs/day: 0.50    Years: 24.00    Pack years: 12.00    Types: Cigarettes  . Smokeless tobacco: Never Used  Vaping Use  . Vaping Use: Never used  Substance Use Topics  . Alcohol use: Yes    Comment: occ  . Drug use: Never     Allergies   Patient has no known allergies.   Review of Systems Review of Systems  Constitutional: Negative for fatigue and fever.  HENT: Negative for congestion, sinus pressure and sore throat.   Eyes: Negative for photophobia, pain and visual disturbance.  Respiratory: Negative for cough and shortness of breath.   Cardiovascular: Negative for chest pain.  Gastrointestinal: Positive for abdominal pain. Negative for nausea and vomiting.  Genitourinary: Negative for decreased urine volume and hematuria.  Musculoskeletal: Positive for back pain. Negative for myalgias, neck pain and neck stiffness.  Neurological: Positive for headaches. Negative for dizziness, syncope, facial asymmetry, speech difficulty,  weakness, light-headedness and numbness.     Physical Exam Triage Vital Signs ED Triage Vitals [07/25/20 1303]  Enc Vitals Group     BP (!) 156/100     Pulse Rate (!) 123     Resp 18     Temp 98.4 F (36.9 C)     Temp Source Oral     SpO2 100 %     Weight 278 lb (126.1 kg)     Height 5\' 9"  (1.753 m)     Head Circumference      Peak Flow      Pain Score 6     Pain Loc      Pain Edu?      Excl. in [place]?    No data found.  Updated Vital Signs BP (!) 156/100   Pulse (!) 123   Temp  98.4 F (36.9 C) (Oral)   Resp 18   Ht 5\' 9"  (1.753 m)   Wt 278 lb (126.1 kg)   SpO2 100%   BMI 41.05 kg/m   Visual Acuity Right Eye Distance:   Left Eye Distance:   Bilateral Distance:    Right Eye Near:   Left Eye Near:    Bilateral Near:     Physical Exam Vitals and nursing note reviewed.  Constitutional:      Appearance: He is well-developed.     Comments: No acute distress  HENT:     Head: Normocephalic and atraumatic.     Nose: Nose normal.     Mouth/Throat:     Comments: Oral mucosa pink and moist, no tonsillar enlargement or exudate. Posterior pharynx patent and nonerythematous, no uvula deviation or swelling. Normal phonation. Eyes:     Conjunctiva/sclera: Conjunctivae normal.  Cardiovascular:     Rate and Rhythm: Normal rate.  Pulmonary:     Effort: Pulmonary effort is normal. No respiratory distress.     Comments: Breathing comfortably at rest, CTABL, no wheezing, rales or other adventitious sounds auscultated Abdominal:     General: There is no distension.  Musculoskeletal:        General: Normal range of motion.     Cervical back: Neck supple.     Comments: No midline tenderness swelling, tenderness to palpation throughout right lumbar musculature  Skin:    General: Skin is warm and dry.  Neurological:     Mental Status: He is alert and oriented to person, place, and time.      UC Treatments / Results  Labs (all labs ordered are listed, but only abnormal results are displayed) Labs Reviewed  SARS CORONAVIRUS 2 (TAT 6-24 HRS)    EKG   Radiology No results found.  Procedures Procedures (including critical care time)  Medications Ordered in UC Medications - No data to display  Initial Impression / Assessment and Plan / UC Course  I have reviewed the triage vital signs and the nursing notes.  Pertinent labs & imaging results that were available during my care of the patient were reviewed by me and considered in my medical decision making  (see chart for details).     Covid test pending, suspect likely viral etiology, recommend symptomatic and supportive care with anti-inflammatories.  Flonase for congestion.  2 daughters here with similar symptoms.  Discussed strict return precautions. Patient verbalized understanding and is agreeable with plan.  Final Clinical Impressions(s) / UC Diagnoses   Final diagnoses:  Acute nonintractable headache, unspecified headache type  Acute right-sided low back pain without sciatica  Exposure to COVID-19 virus     Discharge Instructions     Covid test pending, monitor my chart for results Tylenol and ibuprofen for body aches, back pain, headache Flonase nasal spray for congestion Follow-up if not improving or worsening    ED Prescriptions    Medication Sig Dispense Auth. Provider   ibuprofen (ADVIL) 800 MG tablet Take 1 tablet (800 mg total) by mouth 3 (three) times daily. 21 tablet Gracious Renken C, PA-C   fluticasone (FLONASE) 50 MCG/ACT nasal spray Place 1-2 sprays into both nostrils daily for 7 days. 1 g Ynez Eugenio, [place] C, PA-C     PDMP not reviewed this encounter.   Janith Lima, PA-C 07/25/20 1431

## 2020-07-25 NOTE — Discharge Instructions (Signed)
Covid test pending, monitor my chart for results Tylenol and ibuprofen for body aches, back pain, headache Flonase nasal spray for congestion Follow-up if not improving or worsening

## 2020-07-25 NOTE — ED Triage Notes (Signed)
Pt c/o 5/10 pain in head, light sensitivityx2 days. Pt c/o pain in the back of eyes. Pt c/o 3/10 pain in lower backxmo.

## 2020-07-28 ENCOUNTER — Telehealth (HOSPITAL_COMMUNITY): Payer: Self-pay | Admitting: Emergency Medicine

## 2020-07-28 NOTE — Telephone Encounter (Signed)
Patient called, verified identity using two identifiers, and provided verbal consent for Christina Silverhardt to pick up his test results.  Results printed.

## 2020-09-25 ENCOUNTER — Other Ambulatory Visit: Payer: Self-pay

## 2020-09-25 ENCOUNTER — Encounter (HOSPITAL_COMMUNITY): Payer: Self-pay | Admitting: Emergency Medicine

## 2020-09-25 ENCOUNTER — Ambulatory Visit (HOSPITAL_COMMUNITY)
Admission: EM | Admit: 2020-09-25 | Discharge: 2020-09-25 | Disposition: A | Discharge status: Home / Self Care | Payer: BLUE CROSS/BLUE SHIELD | Attending: Family Medicine | Admitting: Family Medicine

## 2020-09-25 DIAGNOSIS — M7989 Other specified soft tissue disorders: Secondary | ICD-10-CM

## 2020-09-25 DIAGNOSIS — D235 Other benign neoplasm of skin of trunk: Secondary | ICD-10-CM

## 2020-09-25 DIAGNOSIS — I739 Peripheral vascular disease, unspecified: Secondary | ICD-10-CM | POA: Diagnosis not present

## 2020-09-25 DIAGNOSIS — L089 Local infection of the skin and subcutaneous tissue, unspecified: Secondary | ICD-10-CM

## 2020-09-25 MED ORDER — TRIAMCINOLONE ACETONIDE 0.5 % EX OINT: 1.0000 "application " | TOPICAL_OINTMENT | Freq: Three times a day (TID) | CUTANEOUS | 0 refills | Status: DC

## 2020-09-25 MED ORDER — FUROSEMIDE 20 MG PO TABS: 20.0000 mg | ORAL_TABLET | Freq: Every day | ORAL | 0 refills | Status: DC

## 2020-09-25 MED ORDER — SULFAMETHOXAZOLE-TRIMETHOPRIM 800-160 MG PO TABS: 1.0000 | ORAL_TABLET | Freq: Two times a day (BID) | ORAL | 0 refills | Status: DC

## 2020-09-25 MED ORDER — TIZANIDINE HCL 4 MG PO TABS: 4.0000 mg | ORAL_TABLET | Freq: Four times a day (QID) | ORAL | 0 refills | Status: DC | PRN

## 2020-09-25 NOTE — ED Provider Notes (Signed)
[place]    CSN: [id number] Arrival date & time: 09/25/20  1501      History   Chief Complaint Chief Complaint  Patient presents with  . Leg Pain    HPI Gerald Mills is a 39 y.o. male.   HPI  Patient presents today with bilateral lower feet pain.  He also endorses some left lower leg swelling.  Patient has a history of hypertension, chronic smoker, and per EMR of a recent primary care visit there was some concern that patient may have some underlying vascular disease.  Patient endorses that he continues to smoke.  He wears compression socks for swelling however of the last 5 days he has had worsening leg and foot pain with ambulation and standing.  He endorses numbness and tingling involving his toes.  And endorses periodic loss of sensation following standing.  He reports the pain in his leg is felt anteriorly, sharp and radiates anteriorly into his calves.  Patient reports that he has not had any vascular specialty evaluation or ABI studies.  Past Medical History:  Diagnosis Date  . Hypertension     Patient Active Problem List   Diagnosis Date Noted  . History of snoring 05/28/2020  . 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 Tinea cruris 02/07/2020  . Erectile dysfunction 02/07/2020  . Hypertension 11/27/2019  . Swelling of lower extremity 11/27/2019  . Current smoker 11/27/2019  . Routine screening for STI (sexually transmitted infection) 11/27/2019  . Screening for diabetes mellitus 11/27/2019    History reviewed. No pertinent surgical history.     Home Medications    Prior to Admission medications   Medication Sig Start Date End Date Taking? Authorizing Provider  amLODipine (NORVASC) 10 MG tablet 1 tablet po q day for blood pressure 02/07/20  Yes Matilde Haymaker, MD  ibuprofen (ADVIL) 800 MG tablet Take 1 tablet (800 mg total) by mouth 3 (three) times daily. 07/25/20  Yes Wieters, Hallie C, PA-C  fluticasone (FLONASE) 50 MCG/ACT nasal spray Place 1-2  sprays into both nostrils daily for 7 days. 07/25/20 08/01/20  Wieters, Hallie C, PA-C  sildenafil (VIAGRA) 25 MG tablet Take 1 tablet (25 mg total) by mouth daily as needed for erectile dysfunction. 02/07/20   Matilde Haymaker, MD  escitalopram (LEXAPRO) 10 MG tablet Take 1 tablet (10 mg total) by mouth daily. 10/29/17 08/25/19  Robyn Haber, MD  hydrochlorothiazide (HYDRODIURIL) 25 MG tablet Take 1 tablet (25 mg total) by mouth daily. 04/04/17 08/25/19  Marney Setting, NP  lisinopril-hydrochlorothiazide (ZESTORETIC) 10-12.5 MG tablet Take 1 tablet by mouth daily. 05/21/19 08/25/19  Sharion Balloon, NP    Family History Family History  Problem Relation Age of Onset  . Diabetes Mother   . Hypertension Mother   . Healthy Father   . Hypertension Father     Social History Social History   Tobacco Use  . Smoking status: Current Every Day Smoker    Packs/day: 0.50    Years: 24.00    Pack years: 12.00    Types: Cigarettes  . Smokeless tobacco: Never Used  Vaping Use  . Vaping Use: Never used  Substance Use Topics  . Alcohol use: Yes    Comment: occ  . Drug use: Never     Allergies   Patient has no known allergies.   Review of Systems Review of Systems Pertinent negatives listed in HPI   Physical Exam Triage Vital Signs ED Triage Vitals  Enc Vitals Group  BP 09/25/20 1548 (!) 158/95     Pulse Rate 09/25/20 1548 64     Resp 09/25/20 1548 20     Temp 09/25/20 1548 97.9 F (36.6 C)     Temp Source 09/25/20 1548 Oral     SpO2 09/25/20 1548 100 %     Weight --      Height --      Head Circumference --      Peak Flow --      Pain Score 09/25/20 1546 8     Pain Loc --      Pain Edu? --      Excl. in [place]? --    No data found.  Updated Vital Signs BP (!) 158/95 (BP Location: Right Arm) Comment (BP Location): large cuff  Pulse 64   Temp 97.9 F (36.6 C) (Oral)   Resp 20   SpO2 100%   Visual Acuity Right Eye Distance:   Left Eye Distance:   Bilateral  Distance:    Right Eye Near:   Left Eye Near:    Bilateral Near:     Physical Exam Constitutional:      Appearance: He is obese.  Cardiovascular:     Rate and Rhythm: Normal rate and regular rhythm.     Pulses:          Posterior tibial pulses are 1+ on the right side and 1+ on the left side.     Comments: Unable to palpate DP (BLE) Pulmonary:     Effort: Pulmonary effort is normal.     Breath sounds: Normal breath sounds.  Musculoskeletal:     Right lower leg: 2+ Edema present.     Left lower leg: 2+ Edema present.  Skin:    Capillary Refill: Capillary refill takes 2 to 3 seconds.     Comments: Hyperpigmented, erythematous, dry skin BLE and feet  Neurological:     Mental Status: He is alert.  Psychiatric:        Attention and Perception: Attention normal.        Mood and Affect: Mood normal.        Speech: Speech normal.      UC Treatments / Results  Labs (all labs ordered are listed, but only abnormal results are displayed) Labs Reviewed - No data to display  EKG   Radiology No results found.  Procedures Procedures (including critical care time)  Medications Ordered in UC Medications - No data to display  Initial Impression / Assessment and Plan / UC Course  I have reviewed the triage vital signs and the nursing notes.  Pertinent labs & imaging results that were available during my care of the patient were reviewed by me and considered in my medical decision making (see chart for details).    Patient is experiencing symptoms consistent with claudication which is concerning for underlying peripheral vascular disease given patient's medical history and generalized appearance of lower extremities. Encouraged to continue Wear compression socks.  Placed a referral for patient to be evaluated by a vascular specialty here at [place].  For now we will trial a course of Lasix to relieve fluid retention and tizanidine for leg pain. Patient also has a cyst on his  mid back which he has opened in independently drained pus.  Area remains tender to touch and advised patient cystic lesion can recur as he has multiple hairs growing inwardly on his upper back.  Trial a course of triamcinolone cream to reduce inflammation  and tenderness at the site.  Advised a few days of topical steroid does not relieve tenderness in area short course of Bactrim has been prescribed. Patient verbalized understanding and agreement with plan. Final Clinical Impressions(s) / UC Diagnoses   Final diagnoses:  Claudication of both lower extremities (HCC)  Leg swelling  Dermoid cyst of skin of back  Skin infection     Discharge Instructions     Follow-up with vascular department if you have not heard from anyone by Monday regarding scheduling your vascular appointment.  In the meantime I have prescribed you tizanidine take medication only during periods of rest as it causes sedation for leg pain.  I have also prescribed you for frusemide which is a pill to reduce the swelling in your legs and in your feet.  Take this medication during periods of being awake as this medication works by causing you to urinate.  For the skin boil on your back I have prescribed you triamcinolone cream I would like for you to try the cream initially to see if the swelling and inflammation resolves.  If the area becomes reddened or tenderness and pain does not subside I have prescribed you a short course of antibiotic treatment which should cure any underlying infection.    ED Prescriptions    Medication Sig Dispense Auth. Provider   furosemide (LASIX) 20 MG tablet Take 1 tablet (20 mg total) by mouth daily for 7 days. 7 tablet Scot Jun, FNP   tiZANidine (ZANAFLEX) 4 MG tablet Take 1 tablet (4 mg total) by mouth every 6 (six) hours as needed for muscle spasms. 30 tablet Scot Jun, FNP   triamcinolone ointment (KENALOG) 0.5 % Apply 1 application topically 3 (three) times daily. 90 g  Scot Jun, FNP   sulfamethoxazole-trimethoprim (BACTRIM DS) 800-160 MG tablet Take 1 tablet by mouth 2 (two) times daily. 10 tablet Scot Jun, FNP     PDMP not reviewed this encounter.   Scot Jun, FNP 09/25/20 2004

## 2020-09-25 NOTE — Discharge Instructions (Signed)
Follow-up with vascular department if you have not heard from anyone by Monday regarding scheduling your vascular appointment.  In the meantime I have prescribed you tizanidine take medication only during periods of rest as it causes sedation for leg pain.  I have also prescribed you for frusemide which is a pill to reduce the swelling in your legs and in your feet.  Take this medication during periods of being awake as this medication works by causing you to urinate.  For the skin boil on your back I have prescribed you triamcinolone cream I would like for you to try the cream initially to see if the swelling and inflammation resolves.  If the area becomes reddened or tenderness and pain does not subside I have prescribed you a short course of antibiotic treatment which should cure any underlying infection.

## 2020-09-25 NOTE — ED Triage Notes (Addendum)
Left leg swollen from knee to lower leg.  Reports swelling and pain, pressure pain for 1 week.  Patient is wearing compression socks  Patient has pain and swelling in right lateral foot  Patient also reports abscess to back

## 2020-10-08 ENCOUNTER — Other Ambulatory Visit: Payer: Self-pay

## 2020-10-08 ENCOUNTER — Encounter: Payer: Self-pay | Admitting: Cardiovascular Disease

## 2020-10-08 ENCOUNTER — Ambulatory Visit (INDEPENDENT_AMBULATORY_CARE_PROVIDER_SITE_OTHER): Payer: BLUE CROSS/BLUE SHIELD | Admitting: Cardiovascular Disease

## 2020-10-08 VITALS — BP 160/81 | HR 52 | Temp 97.2°F | Ht 76.0 in | Wt 279.2 lb

## 2020-10-08 DIAGNOSIS — F17200 Nicotine dependence, unspecified, uncomplicated: Secondary | ICD-10-CM

## 2020-10-08 DIAGNOSIS — I10 Essential (primary) hypertension: Secondary | ICD-10-CM

## 2020-10-08 DIAGNOSIS — M7989 Other specified soft tissue disorders: Secondary | ICD-10-CM

## 2020-10-08 DIAGNOSIS — M79662 Pain in left lower leg: Secondary | ICD-10-CM

## 2020-10-08 NOTE — Assessment & Plan Note (Signed)
On long history tobacco abuse having smoked 1/2 pack a day for the last 25 years.  We had discussion about importance of smoking cessation.

## 2020-10-08 NOTE — Assessment & Plan Note (Signed)
Gerald Mills was recently seen in the ER on 09/25/2020 lower extremity swelling and left calf pain among many other complaints.  He was given furosemide for 7 days.  The swelling has somewhat improved.  I am going to get left lower extremity venous Dopplers to rule out DVT.

## 2020-10-08 NOTE — Patient Instructions (Addendum)
Medication Instructions:  Your physician recommends that you continue on your current medications as directed. Please refer to the Current Medication list given to you today.  *If you need a refill on your cardiac medications before your next appointment, please call your pharmacy*   Lab Work: Your physician recommends that you return for lab work in: 1-2 weeks for BMET, and lipid/liver profile.  If you have labs (blood work) drawn today and your tests are completely normal, you will receive your results only by: Marland Kitchen MyChart Message (if you have MyChart) OR . A paper copy in the mail If you have any lab test that is abnormal or we need to change your treatment, we will call you to review the results.   Testing/Procedures: Your physician has requested that you have a lower extremity venous duplex. This test is an ultrasound of the veins in the legs. It looks at venous blood flow that carries blood from the heart to the legs. Allow one hour for a Lower Venous exam. There are no restrictions or special instructions. [place]. 2nd Floor.     Dr. Gwenlyn Found has ordered a CT coronary calcium score. This test is done at [address] 3rd [address]. This is $150 out of pocket.   Coronary CalciumScan A coronary calcium scan is an imaging test used to look for deposits of calcium and other fatty materials (plaques) in the inner lining of the blood vessels of the heart (coronary arteries). These deposits of calcium and plaques can partly clog and narrow the coronary arteries without producing any symptoms or warning signs. This puts a person at risk for a heart attack. This test can detect these deposits before symptoms develop. Tell a health care provider about:  Any allergies you have.  All medicines you are taking, including vitamins, herbs, eye drops, creams, and over-the-counter medicines.  Any problems you or family members have had with anesthetic medicines.  Any blood disorders  you have.  Any surgeries you have had.  Any medical conditions you have.  Whether you are pregnant or may be pregnant. What are the risks? Generally, this is a safe procedure. However, problems may occur, including:  Harm to a pregnant woman and her unborn baby. This test involves the use of radiation. Radiation exposure can be dangerous to a pregnant woman and her unborn baby. If you are pregnant, you generally should not have this procedure done.  Slight increase in the risk of cancer. This is because of the radiation involved in the test. What happens before the procedure? No preparation is needed for this procedure. What happens during the procedure?  You will undress and remove any jewelry around your neck or chest.  You will put on a hospital gown.  Sticky electrodes will be placed on your chest. The electrodes will be connected to an electrocardiogram (ECG) machine to record a tracing of the electrical activity of your heart.  A CT scanner will take pictures of your heart. During this time, you will be asked to lie still and hold your breath for 2-3 seconds while a picture of your heart is being taken. The procedure may vary among health care providers and hospitals. What happens after the procedure?  You can get dressed.  You can return to your normal activities.  It is up to you to get the results of your test. Ask your health care provider, or the department that is doing the test, when your results will be ready. Summary  A coronary calcium scan is an imaging test used to look for deposits of calcium and other fatty materials (plaques) in the inner lining of the blood vessels of the heart (coronary arteries).  Generally, this is a safe procedure. Tell your health care provider if you are pregnant or may be pregnant.  No preparation is needed for this procedure.  A CT scanner will take pictures of your heart.  You can return to your normal activities after the scan  is done. This information is not intended to replace advice given to you by your health care provider. Make sure you discuss any questions you have with your health care provider. Document Released: 04/22/2008 Document Revised: 09/13/2016 Document Reviewed: 09/13/2016 Elsevier Interactive Patient Education  2017 [place]: At [facility], you and your health needs are our priority.  As part of our continuing mission to provide you with exceptional heart care, we have created designated Provider Care Teams.  These Care Teams include your primary Cardiologist (physician) and Advanced Practice Providers (APPs -  Physician Assistants and Nurse Practitioners) who all work together to provide you with the care you need, when you need it.  We recommend signing up for the patient portal called "MyChart".  Sign up information is provided on this After Visit Summary.  MyChart is used to connect with patients for Virtual Visits (Telemedicine).  Patients are able to view lab/test results, encounter notes, upcoming appointments, etc.  Non-urgent messages can be sent to your provider as well.   To learn more about what you can do with MyChart, go to [email or web address].    Your next appointment:   2 month(s)  The format for your next appointment:   In Person  Provider:   Quay Burow, MD   Other Instructions Dr. Gwenlyn Found would like you to take your blood pressure everyday and keep a log.  Dr. Gwenlyn Found would like you to see a Pharmacist at this office to review B/P log and medications.     Heart-Healthy Eating Plan Heart-healthy meal planning includes:  Eating less unhealthy fats.  Eating more healthy fats.  Making other changes in your diet. Talk with your doctor or a diet specialist (dietitian) to create an eating plan that is right for you. What is my plan? Your doctor may recommend an eating plan that includes:  Total fat: ______% or less of total calories a  day.  Saturated fat: ______% or less of total calories a day.  Cholesterol: less than _________mg a day. What are tips for following this plan? Cooking Avoid frying your food. Try to bake, boil, grill, or broil it instead. You can also reduce fat by:  Removing the skin from poultry.  Removing all visible fats from meats.  Steaming vegetables in water or broth. Meal planning   At meals, divide your plate into four equal parts: ? Fill one-half of your plate with vegetables and green salads. ? Fill one-fourth of your plate with whole grains. ? Fill one-fourth of your plate with lean protein foods.  Eat 4-5 servings of vegetables per day. A serving of vegetables is: ? 1 cup of raw or cooked vegetables. ? 2 cups of raw leafy greens.  Eat 4-5 servings of fruit per day. A serving of fruit is: ? 1 medium whole fruit. ?  cup of dried fruit. ?  cup of fresh, frozen, or canned fruit. ?  cup of 100% fruit juice.  Eat more foods that have soluble fiber. These are  apples, broccoli, carrots, beans, peas, and barley. Try to get 20-30 g of fiber per day.  Eat 4-5 servings of nuts, legumes, and seeds per week: ? 1 serving of dried beans or legumes equals  cup after being cooked. ? 1 serving of nuts is  cup. ? 1 serving of seeds equals 1 tablespoon. General information  Eat more home-cooked food. Eat less restaurant, buffet, and fast food.  Limit or avoid alcohol.  Limit foods that are high in starch and sugar.  Avoid fried foods.  Lose weight if you are overweight.  Keep track of how much salt (sodium) you eat. This is important if you have high blood pressure. Ask your doctor to tell you more about this.  Try to add vegetarian meals each week. Fats  Choose healthy fats. These include olive oil and canola oil, flaxseeds, walnuts, almonds, and seeds.  Eat more omega-3 fats. These include salmon, mackerel, sardines, tuna, flaxseed oil, and ground flaxseeds. Try to eat fish  at least 2 times each week.  Check food labels. Avoid foods with trans fats or high amounts of saturated fat.  Limit saturated fats. ? These are often found in animal products, such as meats, butter, and cream. ? These are also found in plant foods, such as palm oil, palm kernel oil, and coconut oil.  Avoid foods with partially hydrogenated oils in them. These have trans fats. Examples are stick margarine, some tub margarines, cookies, crackers, and other baked goods. What foods can I eat? Fruits All fresh, canned (in natural juice), or frozen fruits. Vegetables Fresh or frozen vegetables (raw, steamed, roasted, or grilled). Green salads. Grains Most grains. Choose whole wheat and whole grains most of the time. Rice and pasta, including brown rice and pastas made with whole wheat. Meats and other proteins Lean, well-trimmed beef, veal, pork, and lamb. Chicken and [place] without skin. All fish and shellfish. Wild duck, rabbit, pheasant, and venison. Egg whites or low-cholesterol egg substitutes. Dried beans, peas, lentils, and tofu. Seeds and most nuts. Dairy Low-fat or nonfat cheeses, including ricotta and mozzarella. Skim or 1% milk that is liquid, powdered, or evaporated. Buttermilk that is made with low-fat milk. Nonfat or low-fat yogurt. Fats and oils Non-hydrogenated (trans-free) margarines. Vegetable oils, including soybean, sesame, sunflower, olive, peanut, safflower, corn, canola, and cottonseed. Salad dressings or mayonnaise made with a vegetable oil. Beverages Mineral water. Coffee and tea. Diet carbonated beverages. Sweets and desserts Sherbet, gelatin, and fruit ice. Small amounts of dark chocolate. Limit all sweets and desserts. Seasonings and condiments All seasonings and condiments. The items listed above may not be a complete list of foods and drinks you can eat. Contact a dietitian for more options. What foods should I avoid? Fruits Canned fruit in heavy syrup. Fruit  in cream or butter sauce. Fried fruit. Limit coconut. Vegetables Vegetables cooked in cheese, cream, or butter sauce. Fried vegetables. Grains Breads that are made with saturated or trans fats, oils, or whole milk. Croissants. Sweet rolls. Donuts. High-fat crackers, such as cheese crackers. Meats and other proteins Fatty meats, such as hot dogs, ribs, sausage, bacon, rib-eye roast or steak. High-fat deli meats, such as salami and bologna. Caviar. Domestic duck and goose. Organ meats, such as liver. Dairy Cream, sour cream, cream cheese, and creamed cottage cheese. Whole-milk cheeses. Whole or 2% milk that is liquid, evaporated, or condensed. Whole buttermilk. Cream sauce or high-fat cheese sauce. Yogurt that is made from whole milk. Fats and oils Meat fat, or shortening. Cocoa  butter, hydrogenated oils, palm oil, coconut oil, palm kernel oil. Solid fats and shortenings, including bacon fat, salt pork, lard, and butter. Nondairy cream substitutes. Salad dressings with cheese or sour cream. Beverages Regular sodas and juice drinks with added sugar. Sweets and desserts Frosting. Pudding. Cookies. Cakes. Pies. Milk chocolate or white chocolate. Buttered syrups. Full-fat ice cream or ice cream drinks. The items listed above may not be a complete list of foods and drinks to avoid. Contact a dietitian for more information. Summary  Heart-healthy meal planning includes eating less unhealthy fats, eating more healthy fats, and making other changes in your diet.  Eat a balanced diet. This includes fruits and vegetables, low-fat or nonfat dairy, lean protein, nuts and legumes, whole grains, and heart-healthy oils and fats. This information is not intended to replace advice given to you by your health care provider. Make sure you discuss any questions you have with your health care provider. Document Revised: 12/29/2017 Document Reviewed: 12/02/2017 Elsevier Patient Education  2020 [employer].

## 2020-10-08 NOTE — Assessment & Plan Note (Signed)
History of essential hypertension for at least a year on lisinopril, hydrochlorothiazide and amlodipine.  Recently the lisinopril hydrochlorothiazide thiazide were discontinued and furosemide added because of lower extremity edema.  Blood pressure today is 160/81.  We talked about salt in diet.  I am going to provide him with a digital blood pressure cuff and have him keep a 30-day blood pressure log.  We will check a basic metabolic panel this morning.  I will have him see a Pharm.D. back in 4 weeks to review make recommendations with regards to medications and dosages.  I will see him back in 2 months

## 2020-10-08 NOTE — Progress Notes (Signed)
10/08/2020 Gerald Mills   [birth year]  [id number]  Primary Physician Patient, No Pcp Per Primary Cardiologist: Lorretta Harp MD Lupe Carney, [place]  HPI:  Gerald Mills is a 39 y.o. moderately overweight single African-American male father of 4 children with no grandchildren who is Glass blower/designer working third shift.  He was referred because of lower extremity swelling and leg pain.  His cardiovascular risk factor profile is notable for 12 to 15 pack years tobacco abuse currently smoking 1/2 pack/day, treated hypertension over the last year.  He drinks socially.  There is no family history of heart disease.  Never had a heart attack or stroke.  He denies chest pain or shortness of breath.  He was seen in the emergency room on 09/25/2020 with some lower extremity edema and left foot/calf pain among other complaints.  His lisinopril/hydrochlorothiazide was discontinued and he was placed on furosemide for 7 days.  Is feeling somewhat improved.  He really denies claudication.  He also denies chest pain or shortness of breath.   Current Meds  Medication Sig  . amLODipine (NORVASC) 10 MG tablet 1 tablet po q day for blood pressure  . ibuprofen (ADVIL) 800 MG tablet Take 1 tablet (800 mg total) by mouth 3 (three) times daily.  . sildenafil (VIAGRA) 25 MG tablet Take 1 tablet (25 mg total) by mouth daily as needed for erectile dysfunction.  . sulfamethoxazole-trimethoprim (BACTRIM DS) 800-160 MG tablet Take 1 tablet by mouth 2 (two) times daily.  Marland Kitchen tiZANidine (ZANAFLEX) 4 MG tablet Take 1 tablet (4 mg total) by mouth every 6 (six) hours as needed for muscle spasms.  Marland Kitchen triamcinolone ointment (KENALOG) 0.5 % Apply 1 application topically 3 (three) times daily.     No Known Allergies  Social History   Socioeconomic History  . Marital status: Single    Spouse name: Not on file  . Number of children: Not on file  . Years of education: Not on file  . Highest education level: Not on  file  Occupational History  . Not on file  Tobacco Use  . Smoking status: Current Every Day Smoker    Packs/day: 0.50    Years: 24.00    Pack years: 12.00    Types: Cigarettes  . Smokeless tobacco: Never Used  Vaping Use  . Vaping Use: Never used  Substance and Sexual Activity  . Alcohol use: Yes    Comment: occ  . Drug use: Never  . Sexual activity: Yes    Birth control/protection: None  Other Topics Concern  . Not on file  Social History Narrative  . Not on file   Social Determinants of Health   Financial Resource Strain:   . Difficulty of Paying Living Expenses: Not on file  Food Insecurity:   . Worried About Charity fundraiser in the Last Year: Not on file  . Ran Out of Food in the Last Year: Not on file  Transportation Needs:   . Lack of Transportation (Medical): Not on file  . Lack of Transportation (Non-Medical): Not on file  Physical Activity:   . Days of Exercise per Week: Not on file  . Minutes of Exercise per Session: Not on file  Stress:   . Feeling of Stress : Not on file  Social Connections:   . Frequency of Communication with Friends and Family: Not on file  . Frequency of Social Gatherings with Friends and Family: Not on file  . Attends  Religious Services: Not on file  . Active Member of Clubs or Organizations: Not on file  . Attends Archivist Meetings: Not on file  . Marital Status: Not on file  Intimate Partner Violence:   . Fear of Current or Ex-Partner: Not on file  . Emotionally Abused: Not on file  . Physically Abused: Not on file  . Sexually Abused: Not on file     Review of Systems: General: negative for chills, fever, night sweats or weight changes.  Cardiovascular: negative for chest pain, dyspnea on exertion, edema, orthopnea, palpitations, paroxysmal nocturnal dyspnea or shortness of breath Dermatological: negative for rash Respiratory: negative for cough or wheezing Urologic: negative for hematuria Abdominal: negative  for nausea, vomiting, diarrhea, bright red blood per rectum, melena, or hematemesis Neurologic: negative for visual changes, syncope, or dizziness All other systems reviewed and are otherwise negative except as noted above.    Blood pressure (!) 160/81, pulse (!) 52, temperature (!) 97.2 F (36.2 C), height 6\' 4"  (1.93 m), weight 279 lb 3.2 oz (126.6 kg), SpO2 98 %.  General appearance: alert and no distress Neck: no adenopathy, no carotid bruit, no JVD, supple, symmetrical, trachea midline and thyroid not enlarged, symmetric, no tenderness/mass/nodules Lungs: clear to auscultation bilaterally Heart: regular rate and rhythm, S1, S2 normal, no murmur, click, rub or gallop Extremities: extremities normal, atraumatic, no cyanosis or edema Pulses: 2+ and symmetric Skin: Skin color, texture, turgor normal. No rashes or lesions Neurologic: Alert and oriented X 3, normal strength and tone. Normal symmetric reflexes. Normal coordination and gait  EKG sinus bradycardia 52 with LVH voltage, septal Q waves and lateral T wave inversion probably repolarization change.  I personally reviewed this EKG.  ASSESSMENT AND PLAN:   Hypertension History of essential hypertension for at least a year on lisinopril, hydrochlorothiazide and amlodipine.  Recently the lisinopril hydrochlorothiazide thiazide were discontinued and furosemide added because of lower extremity edema.  Blood pressure today is 160/81.  We talked about salt in diet.  I am going to provide him with a digital blood pressure cuff and have him keep a 30-day blood pressure log.  We will check a basic metabolic panel this morning.  I will have him see a Pharm.D. back in 4 weeks to review make recommendations with regards to medications and dosages.  I will see him back in 2 months  Pain of left calf Mr. Tinoco was recently seen in the ER on 09/25/2020 lower extremity swelling and left calf pain among many other complaints.  He was given furosemide  for 7 days.  The swelling has somewhat improved.  I am going to get left lower extremity venous Dopplers to rule out DVT.  Current smoker On long history tobacco abuse having smoked 1/2 pack a day for the last 25 years.  We had discussion about importance of smoking cessation.      Lorretta Harp MD FACP,FACC,FAHA, [facility] 10/08/2020 9:51 AM

## 2020-10-24 ENCOUNTER — Ambulatory Visit (HOSPITAL_COMMUNITY): Admission: RE | Admit: 2020-10-24 | Payer: BLUE CROSS/BLUE SHIELD | Source: Ambulatory Visit

## 2020-10-24 ENCOUNTER — Encounter (HOSPITAL_COMMUNITY): Payer: Self-pay

## 2020-10-24 ENCOUNTER — Inpatient Hospital Stay: Admission: RE | Admit: 2020-10-24 | Payer: BLUE CROSS/BLUE SHIELD | Source: Ambulatory Visit

## 2020-10-27 ENCOUNTER — Inpatient Hospital Stay: Admission: RE | Admit: 2020-10-27 | Payer: BLUE CROSS/BLUE SHIELD | Source: Ambulatory Visit

## 2020-11-10 ENCOUNTER — Ambulatory Visit (HOSPITAL_COMMUNITY)
Admission: EM | Admit: 2020-11-10 | Discharge: 2020-11-10 | Disposition: A | Discharge status: Home / Self Care | Payer: BLUE CROSS/BLUE SHIELD | Attending: Internal Medicine | Admitting: Internal Medicine

## 2020-11-10 ENCOUNTER — Other Ambulatory Visit: Payer: Self-pay

## 2020-11-10 DIAGNOSIS — Z20822 Contact with and (suspected) exposure to covid-19: Secondary | ICD-10-CM | POA: Diagnosis not present

## 2020-11-10 LAB — SARS CORONAVIRUS 2 (TAT 6-24 HRS): SARS Coronavirus 2: NEGATIVE

## 2020-11-10 NOTE — ED Triage Notes (Signed)
Pt presents for covid testing after an exposure with no known symptoms.

## 2020-11-11 ENCOUNTER — Ambulatory Visit: Payer: BLUE CROSS/BLUE SHIELD

## 2020-11-11 NOTE — Progress Notes (Deleted)
     11/11/2020 Gerald Mills Mar 21, 1981 [id number]   HPI:  Gerald Mills is a 40 y.o. male patient of Dr Gerald Mills, with a PMH below who presents today for hypertension clinic evaluation.  He was seen by Dr. Allyson Mills on Dec 1 because of lower extremity edema, at which time it was noted his BP was 160/81.        Blood Pressure Goal:  130/80  Current Medications: amlodipine 10 mg qd,   Family Hx:  Social Hx:  Diet:  Exercise:  Home BP readings:  Intolerances:   Labs: 1/21:  Na 143, K 4.2, Glu 134, BUN 15, SCr 0.87 GFR 127  Wt Readings from Last 3 Encounters:  10/08/20 279 lb 3.2 oz (126.6 kg)  07/25/20 278 lb (126.1 kg)  06/11/20 278 lb (126.1 kg)   BP Readings from Last 3 Encounters:  11/10/20 (!) 148/90  10/08/20 (!) 160/81  09/25/20 (!) 158/95   Pulse Readings from Last 3 Encounters:  11/10/20 (!) 50  10/08/20 (!) 52  09/25/20 64    Current Outpatient Medications  Medication Sig Dispense Refill  . amLODipine (NORVASC) 10 MG tablet 1 tablet po q day for blood pressure 60 tablet 3  . fluticasone (FLONASE) 50 MCG/ACT nasal spray Place 1-2 sprays into both nostrils daily for 7 days. 1 g 0  . furosemide (LASIX) 20 MG tablet Take 1 tablet (20 mg total) by mouth daily for 7 days. 7 tablet 0  . ibuprofen (ADVIL) 800 MG tablet Take 1 tablet (800 mg total) by mouth 3 (three) times daily. 21 tablet 0  . sildenafil (VIAGRA) 25 MG tablet Take 1 tablet (25 mg total) by mouth daily as needed for erectile dysfunction. 10 tablet 5  . sulfamethoxazole-trimethoprim (BACTRIM DS) 800-160 MG tablet Take 1 tablet by mouth 2 (two) times daily. 10 tablet 0  . tiZANidine (ZANAFLEX) 4 MG tablet Take 1 tablet (4 mg total) by mouth every 6 (six) hours as needed for muscle spasms. 30 tablet 0  . triamcinolone ointment (KENALOG) 0.5 % Apply 1 application topically 3 (three) times daily. 90 g 0   No current facility-administered medications for this visit.    No Known Allergies  Past Medical  History:  Diagnosis Date  . Hypertension     There were no vitals taken for this visit.  No problem-specific Assessment & Plan notes found for this encounter.   Phillips Hay PharmD CPP [facility] [address] [place], [place] [address] [phone]

## 2020-12-10 ENCOUNTER — Other Ambulatory Visit: Payer: Self-pay | Admitting: Family Medicine

## 2020-12-12 ENCOUNTER — Ambulatory Visit: Payer: BLUE CROSS/BLUE SHIELD | Admitting: Cardiovascular Disease

## 2021-01-15 IMAGING — DX CHEST - 2 VIEW
2 series · 2 of 2 positions shown · non-contrast
Comparison: January 21, 2011.

CLINICAL DATA: Cough

EXAM:
CHEST - 2 VIEW

[chest pa]
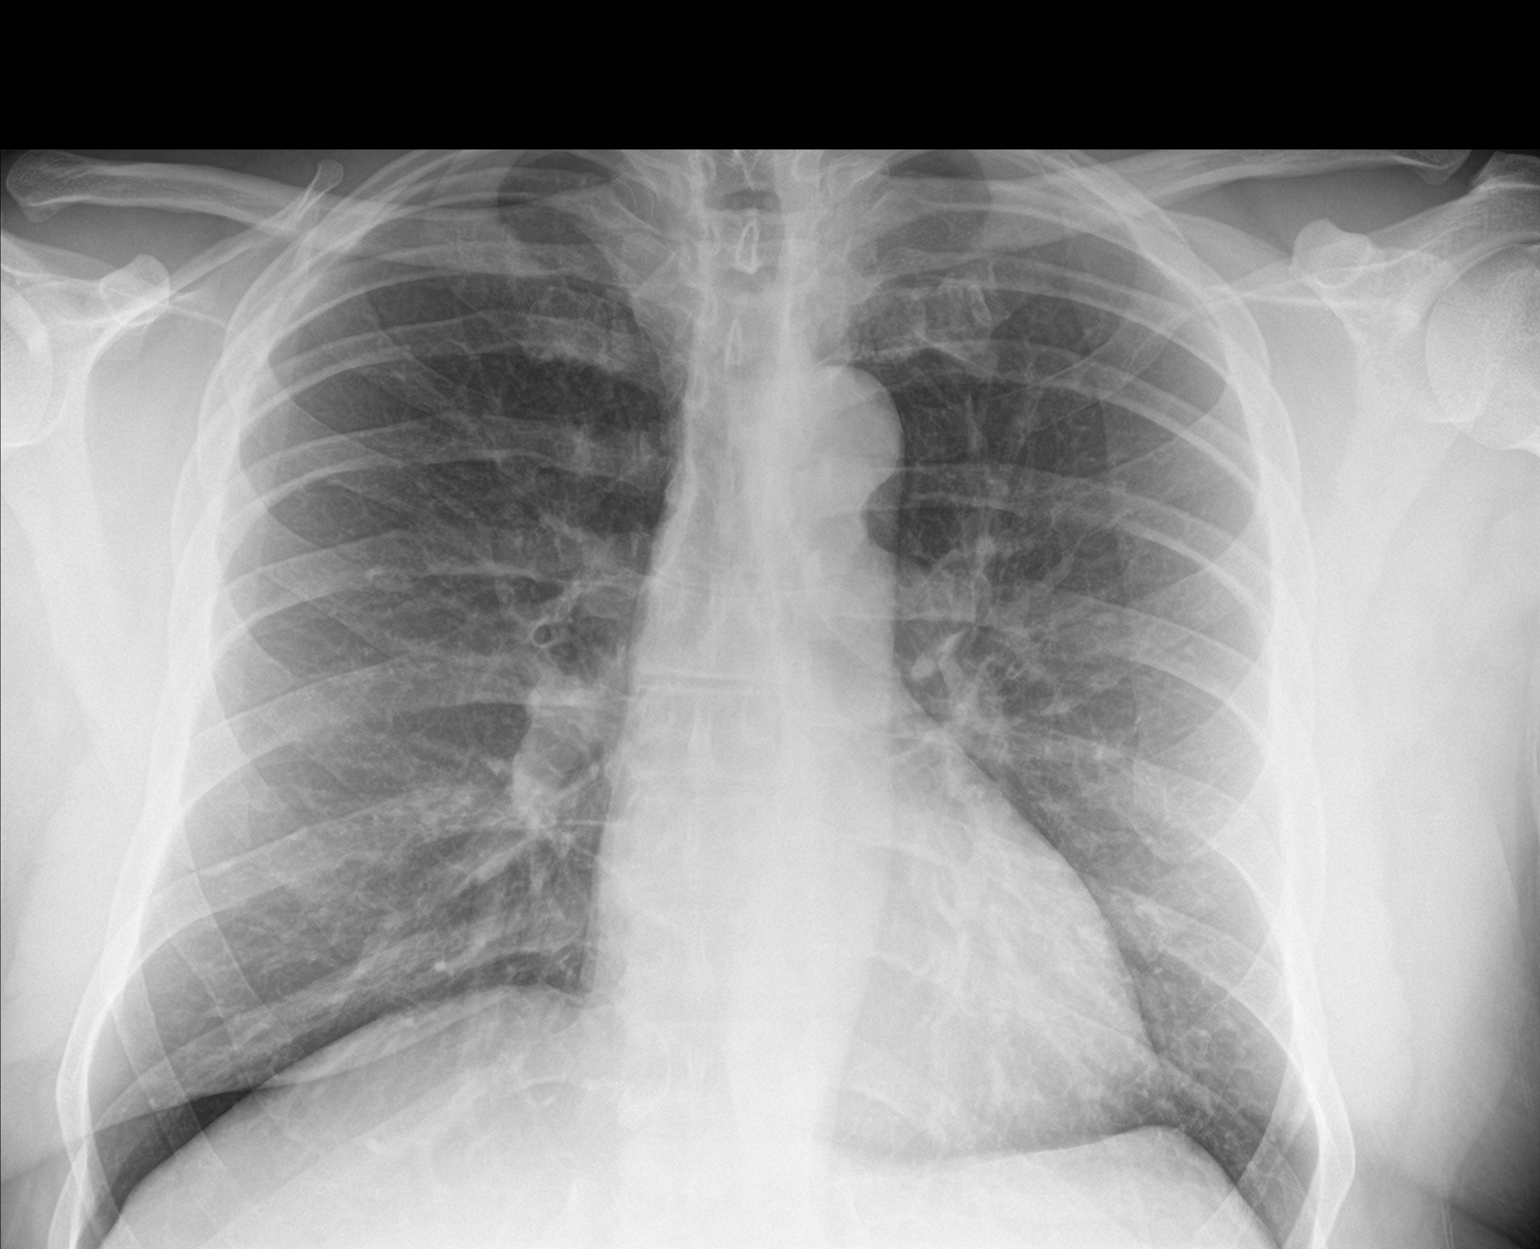

[chest lat]
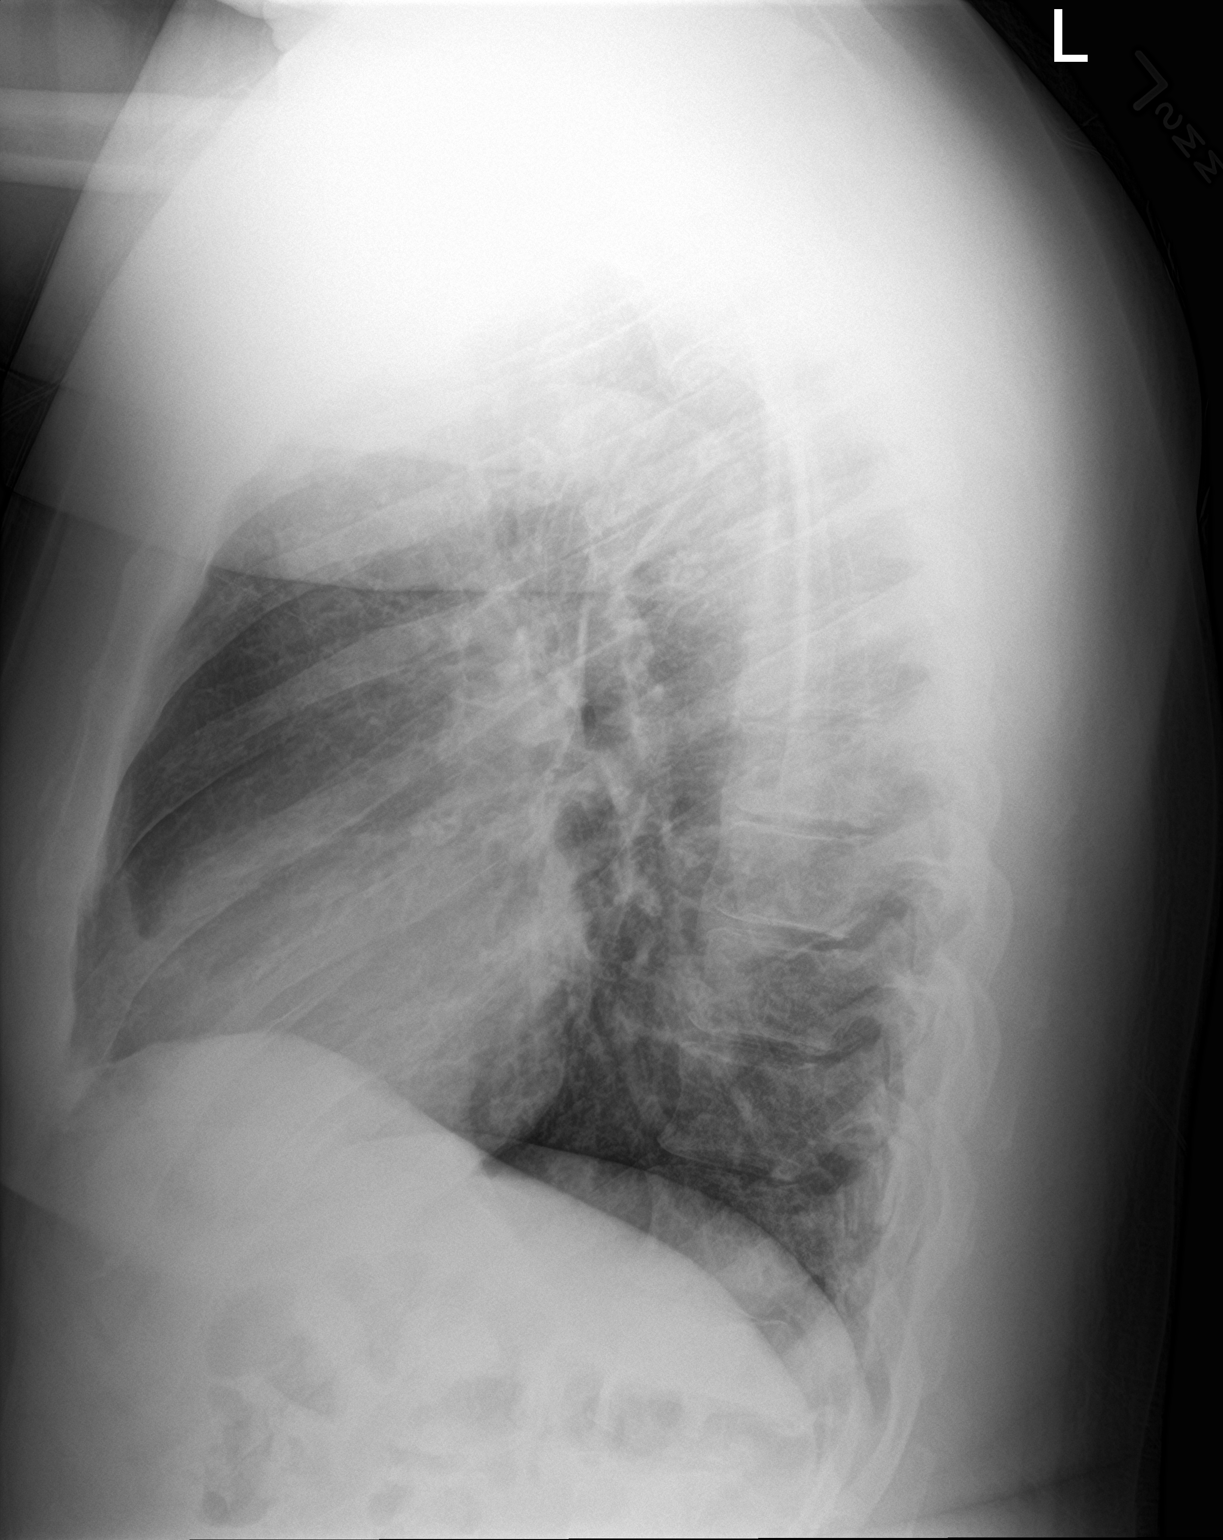

[2 of 2 positions shown; findings below may reference images not displayed]

FINDINGS: The lungs are clear. The heart size and pulmonary vascularity are
normal. No adenopathy. No bone lesions.
IMPRESSION: No edema or consolidation.

## 2021-01-22 ENCOUNTER — Other Ambulatory Visit: Payer: Self-pay | Admitting: Family Medicine

## 2021-03-03 ENCOUNTER — Ambulatory Visit (HOSPITAL_COMMUNITY)
Admission: EM | Admit: 2021-03-03 | Discharge: 2021-03-03 | Disposition: A | Discharge status: Home / Self Care | Payer: BLUE CROSS/BLUE SHIELD | Attending: Emergency Medicine | Admitting: Emergency Medicine

## 2021-03-03 ENCOUNTER — Encounter (HOSPITAL_COMMUNITY): Payer: Self-pay | Admitting: Emergency Medicine

## 2021-03-03 ENCOUNTER — Other Ambulatory Visit: Payer: Self-pay

## 2021-03-03 DIAGNOSIS — M722 Plantar fascial fibromatosis: Secondary | ICD-10-CM | POA: Diagnosis not present

## 2021-03-03 DIAGNOSIS — L0501 Pilonidal cyst with abscess: Secondary | ICD-10-CM | POA: Diagnosis not present

## 2021-03-03 MED ORDER — CYCLOBENZAPRINE HCL 10 MG PO TABS: 10.0000 mg | ORAL_TABLET | Freq: Every day | ORAL | 0 refills | Status: DC

## 2021-03-03 MED ORDER — DOXYCYCLINE HYCLATE 100 MG PO CAPS: 100.0000 mg | ORAL_CAPSULE | Freq: Two times a day (BID) | ORAL | 0 refills | Status: DC

## 2021-03-03 NOTE — ED Triage Notes (Signed)
Patient presents to [facility] for evaluation of left heel pain x 3 weeks, only when ambulating.  Patient is on his feet for work.  Patient also c/o abscesses popping up on his body over the past 3 weeks.  Most recent to his buttocks, but others to the back of his head/neck and one behind his ear.  The one of his buttocks has opened, but he needs help 'being sure it doesn't come back"

## 2021-03-03 NOTE — Discharge Instructions (Addendum)
FOR YOUR FOOT  Consider changing footwear while working or adding supportive padding to shoes or wrapping area with ACE bandage for support For the next 5 days take ibuprofen 600 mg three times a day with food to help calm inflammation Can use muscle relaxer before going to bed as needed for additional comfort Can use ice or heat in 15 minute intervals for comfort   FOR YOUR ABSCESS  Take antibiotic twice a day for the next 7 days Warm compresses or soaks at least 4 times a day to help area finish draining. The more the better For abscess reoccurring in the same location, you may follow up with general surgery ( information below) for evaluation For abscess increasing in size, increasing in pain, fever, chills, malodorous odor, area not healing please follow up at Urgent Care for reevaluation

## 2021-03-03 NOTE — ED Provider Notes (Signed)
[name]    CSN: [id number] Arrival date & time: 03/03/21  7035      History   Chief Complaint Chief Complaint  Patient presents with  . Foot Pain       . Abscess    HPI Gerald Mills is a 40 y.o. male.   Patient presents with left heel pain for 3 weeks, worsening with standing and ambulation.  Described as soreness.  States primarily during work shift.  Still toed shoes that he is supposed to wear but does not wear them all the time. Denies numbness or tingling.  Pain does not radiate.  Has attempted use of ibuprofen but not consistently.  Patient presents with abscess to buttocks for 3 weeks.  Initially area was tender but open to drain about 1 week ago.  Describes drainage as blood and yellow and Gerald Mills pus.  This is a recurrent abscess to the site.  The recurrent abscess behind right ear and on the nape of neck.  Denies fevers and chills.  Past Medical History:  Diagnosis Date  . Hypertension     Patient Active Problem List   Diagnosis Date Noted  . Pain of left calf 10/08/2020  . History of snoring 05/28/2020  . 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 Tinea cruris 02/07/2020  . Erectile dysfunction 02/07/2020  . Hypertension 11/27/2019  . Swelling of lower extremity 11/27/2019  . Current smoker 11/27/2019  . Routine screening for STI (sexually transmitted infection) 11/27/2019  . Screening for diabetes mellitus 11/27/2019    History reviewed. No pertinent surgical history.     Home Medications    Prior to Admission medications   Medication Sig Start Date End Date Taking? Authorizing Provider  amLODipine (NORVASC) 10 MG tablet Take 1 tablet by mouth once daily for blood pressure 01/22/21  Yes Matilde Haymaker, MD  cyclobenzaprine (FLEXERIL) 10 MG tablet Take 1 tablet (10 mg total) by mouth at bedtime. 03/03/21  Yes Makaveli Hoard, Leitha Schuller, NP  doxycycline (VIBRAMYCIN) 100 MG capsule Take 1 capsule (100 mg total) by mouth 2 (two) times daily. 03/03/21  Yes  Kenderick Kobler R, NP  fluticasone (FLONASE) 50 MCG/ACT nasal spray Place 1-2 sprays into both nostrils daily for 7 days. 07/25/20 08/01/20  Wieters, Hallie C, PA-C  ibuprofen (ADVIL) 800 MG tablet Take 1 tablet (800 mg total) by mouth 3 (three) times daily. 07/25/20   Wieters, Hallie C, PA-C  sildenafil (VIAGRA) 25 MG tablet TAKE 1 TABLET BY MOUTH ONCE DAILY AS NEEDED FOR ERECTILE DYSFUNCTION 01/22/21   Matilde Haymaker, MD  tiZANidine (ZANAFLEX) 4 MG tablet Take 1 tablet (4 mg total) by mouth every 6 (six) hours as needed for muscle spasms. 09/25/20   Scot Jun, FNP  triamcinolone ointment (KENALOG) 0.5 % Apply 1 application topically 3 (three) times daily. 09/25/20   Scot Jun, FNP  escitalopram (LEXAPRO) 10 MG tablet Take 1 tablet (10 mg total) by mouth daily. 10/29/17 08/25/19  Robyn Haber, MD  furosemide (LASIX) 20 MG tablet Take 1 tablet (20 mg total) by mouth daily for 7 days. 09/25/20 03/03/21  Scot Jun, FNP  hydrochlorothiazide (HYDRODIURIL) 25 MG tablet Take 1 tablet (25 mg total) by mouth daily. 04/04/17 08/25/19  Marney Setting, NP  lisinopril-hydrochlorothiazide (ZESTORETIC) 10-12.5 MG tablet Take 1 tablet by mouth daily. 05/21/19 08/25/19  Sharion Balloon, NP    Family History Family History  Problem Relation Age of Onset  . Diabetes Mother   . Hypertension  Mother   . Healthy Father   . Hypertension Father     Social History Social History   Tobacco Use  . Smoking status: Current Every Day Smoker    Packs/day: 0.50    Years: 24.00    Pack years: 12.00    Types: Cigarettes  . Smokeless tobacco: Never Used  Vaping Use  . Vaping Use: Never used  Substance Use Topics  . Alcohol use: Yes    Comment: occ  . Drug use: Never     Allergies   Patient has no known allergies.   Review of Systems Review of Systems  Defer to HPI   Physical Exam Triage Vital Signs ED Triage Vitals  Enc Vitals Group     BP 03/03/21 0815 (!) 185/108      Pulse Rate 03/03/21 0815 (!) 57     Resp --      Temp 03/03/21 0815 98.6 F (37 C)     Temp Source 03/03/21 0815 Oral     SpO2 03/03/21 0815 99 %     Weight --      Height --      Head Circumference --      Peak Flow --      Pain Score 03/03/21 0816 8     Pain Loc --      Pain Edu? --      Excl. in [place]? --    No data found.  Updated Vital Signs BP (!) 185/108 (BP Location: Right Arm) Comment: has not taken BP meds today  Pulse (!) 57   Temp 98.6 F (37 C) (Oral)   SpO2 99%   Visual Acuity Right Eye Distance:   Left Eye Distance:   Bilateral Distance:    Right Eye Near:   Left Eye Near:    Bilateral Near:     Physical Exam Constitutional:      Appearance: Normal appearance. He is normal weight.  HENT:     Head: Normocephalic.  Pulmonary:     Effort: Pulmonary effort is normal.  Musculoskeletal:     Cervical back: Normal range of motion.     Right foot: No swelling.     Comments: Tenderness over plantar fascia, ROM intact   Skin:    Comments: Open Abscess present at the top of gluteal cleft, attempt to expel drainage through manual pressure, unsuccessful   Neurological:     Mental Status: He is alert and oriented to person, place, and time. Mental status is at baseline.  Psychiatric:        Mood and Affect: Mood normal.        Behavior: Behavior normal.        Thought Content: Thought content normal.        Judgment: Judgment normal.      UC Treatments / Results  Labs (all labs ordered are listed, but only abnormal results are displayed) Labs Reviewed - No data to display  EKG   Radiology No results found.  Procedures Procedures (including critical care time)  Medications Ordered in UC Medications - No data to display  Initial Impression / Assessment and Plan / UC Course  I have reviewed the triage vital signs and the nursing notes.  Pertinent labs & imaging results that were available during my care of the patient were reviewed by me and  considered in my medical decision making (see chart for details).  Plantar fascitis of left foot  Pilinodal abscess   1. Advised ibuprofen  600 mg tid  2. Flexeril 10 mg at bedtime prn 3. Discussed foot support while standing  3. Doxycyline 100 mg bid for 7 days 5. For reoccurring abscess in same location, follow up given for general surgery 6. Strict return precautions given for Urgent care, verbalized understanding.  Final Clinical Impressions(s) / UC Diagnoses   Final diagnoses:  Plantar fasciitis of left foot  Pilonidal abscess     Discharge Instructions     FOR YOUR FOOT  1. Consider changing footwear while working or adding supportive padding to shoes or wrapping area with ACE bandage for support 2. For the next 5 days take ibuprofen 600 mg three times a day with food to help calm inflammation 3. Can use muscle relaxer before going to bed as needed for additional comfort 4. Can use ice or heat in 15 minute intervals for comfort   FOR YOUR ABSCESS  1. Take antibiotic twice a day for the next 7 days 2. Warm compresses or soaks at least 4 times a day to help area finish draining. The more the better 3. For abscess reoccurring in the same location, you may follow up with general surgery ( information below) for evaluation 4. For abscess increasing in size, increasing in pain, fever, chills, malodorous odor, area not healing please follow up at Urgent Care for reevaluation    ED Prescriptions    Medication Sig Dispense Auth. Provider   doxycycline (VIBRAMYCIN) 100 MG capsule Take 1 capsule (100 mg total) by mouth 2 (two) times daily. 14 capsule Levi Crass R, NP   cyclobenzaprine (FLEXERIL) 10 MG tablet Take 1 tablet (10 mg total) by mouth at bedtime. 10 tablet Hans Eden, NP     PDMP not reviewed this encounter.   Hans Eden, [place] 03/03/21 [phone]

## 2021-03-16 ENCOUNTER — Encounter: Payer: Self-pay | Admitting: Family Medicine

## 2021-03-16 ENCOUNTER — Other Ambulatory Visit: Payer: Self-pay

## 2021-03-16 ENCOUNTER — Ambulatory Visit (INDEPENDENT_AMBULATORY_CARE_PROVIDER_SITE_OTHER): Payer: BLUE CROSS/BLUE SHIELD | Admitting: Family Medicine

## 2021-03-16 VITALS — BP 150/90 | HR 59 | Ht 76.0 in | Wt 264.2 lb

## 2021-03-16 DIAGNOSIS — F39 Unspecified mood [affective] disorder: Secondary | ICD-10-CM

## 2021-03-16 DIAGNOSIS — R5383 Other fatigue: Secondary | ICD-10-CM | POA: Diagnosis not present

## 2021-03-16 DIAGNOSIS — N529 Male erectile dysfunction, unspecified: Secondary | ICD-10-CM

## 2021-03-16 DIAGNOSIS — I10 Essential (primary) hypertension: Secondary | ICD-10-CM | POA: Diagnosis not present

## 2021-03-16 DIAGNOSIS — N5314 Retrograde ejaculation: Secondary | ICD-10-CM

## 2021-03-16 MED ORDER — SILDENAFIL CITRATE 25 MG PO TABS: 25.0000 mg | ORAL_TABLET | Freq: Every day | ORAL | 0 refills | Status: DC | PRN

## 2021-03-16 MED ORDER — ESCITALOPRAM OXALATE 10 MG PO TABS: 10.0000 mg | ORAL_TABLET | Freq: Every day | ORAL | 3 refills | Status: DC

## 2021-03-16 NOTE — Patient Instructions (Addendum)
Retrograde ejaculation: I recommend that you stop taking the over-the-counter enhancement supplement.  I have refilled your sildenafil.  Fatigue: Lets get some blood work today.  I have a low suspicion this will show [place] any medical reason for your low energy.  I suspect this may be related to your mood.  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  We can start you on a medication called Lexapro.  It takes as long as 6 weeks to get significant benefit from this medication.  I would like you to come back to clinic in 4 weeks to see how you are doing.

## 2021-03-16 NOTE — Assessment & Plan Note (Signed)
He was advised to stop taking the over-the-counter supplement to see if this changes his symptoms. -Viagra refilled

## 2021-03-16 NOTE — Assessment & Plan Note (Signed)
He is interested in starting medication today and working with therapist. -Start escitalopram 10 mg daily -Therapy resources provided -Return to clinic in 4 weeks

## 2021-03-16 NOTE — Assessment & Plan Note (Signed)
He was encouraged to take his antihypertensive medication every day regardless of how he felt. -Continue amlodipine 10 mg daily

## 2021-03-16 NOTE — Progress Notes (Signed)
    SUBJECTIVE:   CHIEF COMPLAINT / HPI:   Retrograde ejaculation Mr. Gerald Mills has previously been seen and treated for erectile dysfunction.  He reports that his previously prescribed Viagra 25 mg as needed seem to be working well but he ran out and did not like asking for frequent refills.  As a result, he started using over-the-counter, Rhino, sexual enhancement supplements.  Since using these, he has noted that his ejaculate has changed.  He notices that he now has a thin, clear and small volume ejaculate compared to previous experiences.  This is not painful but has changed his experience and he wants to know if anything can be done to repair this issue.  Mood disorder/fatigue He reports that he has been experiencing general symptoms of lack of motivation and low energy for the past several months.  He has previously been treated for a mood disorder but is not currently taking any medications.  He reports that he feels like he never truly gave medications a chance and never took them consistently.  Based on his symptoms today, he is interested in restarting a depression medication and taking it consistently.  He denies SI/HI.  Hypertension -Amlodipine 10 mg He notes that he only takes his antihypertensive medication about 3 times per week.  PERTINENT  PMH / PSH: Hypertension  OBJECTIVE:   BP (!) 150/90   Pulse (!) 59   Ht 6\' 4"  (1.93 m)   Wt 264 lb 4 oz (119.9 kg)   SpO2 99%   BMI 32.17 kg/m    General: Alert and cooperative and appears to be in no acute distress Cardio: Normal S1 and S2, no S3 or S4. Rhythm is regular. No murmurs or rubs.   Pulm: Clear to auscultation bilaterally, no crackles, wheezing, or diminished breath sounds. Normal respiratory effort Abdomen: Bowel sounds normal. Abdomen soft and non-tender.  Extremities: No peripheral edema. Warm/ well perfused.  Strong radial pulses. Neuro: Cranial nerves grossly intact  ASSESSMENT/PLAN:   Retrograde ejaculation He  was advised to stop taking the over-the-counter supplement to see if this changes his symptoms. -Viagra refilled  Hypertension He was encouraged to take his antihypertensive medication every day regardless of how he felt. -Continue amlodipine 10 mg daily  Mood disorder ([place]) He is interested in starting medication today and working with therapist. -Start escitalopram 10 mg daily -Therapy resources provided -Return to clinic in 4 weeks     Matilde Haymaker, MD [place]

## 2021-03-17 ENCOUNTER — Telehealth: Payer: Self-pay

## 2021-03-17 LAB — LIPID PANEL
Chol/HDL Ratio: 3.5 ratio (ref 0.0–5.0)
Cholesterol, Total: 172 mg/dL (ref 100–199)
HDL: 49 mg/dL (ref >39)
LDL Chol Calc (NIH): 103 mg/dL — ABNORMAL HIGH (ref 0–99)
Triglycerides: 108 mg/dL (ref 0–149)
VLDL Cholesterol Cal: 20 mg/dL (ref 5–40)

## 2021-03-17 LAB — CBC
Hematocrit: 43.2 % (ref 37.5–51.0)
Hemoglobin: 14.6 g/dL (ref 13.0–17.7)
MCH: 30 pg (ref 26.6–33.0)
MCHC: 33.8 g/dL (ref 31.5–35.7)
MCV: 89 fL (ref 79–97)
Platelets: 252 10*3/uL (ref 150–450)
RBC: 4.87 x10E6/uL (ref 4.14–5.80)
RDW: 13.8 % (ref 11.6–15.4)
WBC: 4.3 10*3/uL (ref 3.4–10.8)

## 2021-03-17 LAB — BASIC METABOLIC PANEL
BUN/Creatinine Ratio: 12 (ref 9–20)
BUN: 15 mg/dL (ref 6–20)
CO2: 27 mmol/L (ref 20–29)
Calcium: 10.2 mg/dL (ref 8.7–10.2)
Chloride: 102 mmol/L (ref 96–106)
Creatinine, Ser: 1.23 mg/dL (ref 0.76–1.27)
Glucose: 68 mg/dL (ref 65–99)
Potassium: 4.3 mmol/L (ref 3.5–5.2)
Sodium: 144 mmol/L (ref 134–144)
eGFR: 77 mL/min/{1.73_m2} (ref >59)

## 2021-03-17 LAB — TSH: TSH: 0.813 u[IU]/mL (ref 0.450–4.500)

## 2021-03-17 NOTE — Telephone Encounter (Signed)
Patient LVM on nurse line requesting results from blood work. Please advise.

## 2021-05-11 ENCOUNTER — Ambulatory Visit (HOSPITAL_COMMUNITY)
Admission: EM | Admit: 2021-05-11 | Discharge: 2021-05-11 | Disposition: A | Discharge status: Home / Self Care | Payer: BLUE CROSS/BLUE SHIELD | Attending: Urgent Care | Admitting: Urgent Care

## 2021-05-11 ENCOUNTER — Other Ambulatory Visit: Payer: Self-pay

## 2021-05-11 ENCOUNTER — Encounter (HOSPITAL_COMMUNITY): Payer: Self-pay | Admitting: *Deleted

## 2021-05-11 DIAGNOSIS — Z79899 Other long term (current) drug therapy: Secondary | ICD-10-CM | POA: Insufficient documentation

## 2021-05-11 DIAGNOSIS — J069 Acute upper respiratory infection, unspecified: Secondary | ICD-10-CM | POA: Diagnosis not present

## 2021-05-11 DIAGNOSIS — I10 Essential (primary) hypertension: Secondary | ICD-10-CM | POA: Diagnosis not present

## 2021-05-11 DIAGNOSIS — F17210 Nicotine dependence, cigarettes, uncomplicated: Secondary | ICD-10-CM | POA: Insufficient documentation

## 2021-05-11 DIAGNOSIS — Z20822 Contact with and (suspected) exposure to covid-19: Secondary | ICD-10-CM | POA: Diagnosis not present

## 2021-05-11 DIAGNOSIS — R519 Headache, unspecified: Secondary | ICD-10-CM | POA: Insufficient documentation

## 2021-05-11 LAB — SARS CORONAVIRUS 2 (TAT 6-24 HRS): SARS Coronavirus 2: NEGATIVE

## 2021-05-11 MED ORDER — CETIRIZINE HCL 10 MG PO TABS: 10.0000 mg | ORAL_TABLET | Freq: Every day | ORAL | 0 refills | Status: DC

## 2021-05-11 MED ORDER — PROMETHAZINE-DM 6.25-15 MG/5ML PO SYRP: 5.0000 mL | ORAL_SOLUTION | Freq: Every evening | ORAL | 0 refills | Status: DC | PRN

## 2021-05-11 MED ORDER — FLUTICASONE PROPIONATE 50 MCG/ACT NA SUSP: 1.0000 | Freq: Every day | NASAL | 0 refills | Status: DC

## 2021-05-11 NOTE — Discharge Instructions (Addendum)

## 2021-05-11 NOTE — ED Provider Notes (Signed)
Gerald Mills   MRN: [id number] DOB: June 16, 1981  Subjective:   Gerald Mills is a 40 y.o. male presenting for 4-day history of persistent dry cough.  Patient has also had a mild occasional headache.  Had exposure to a coworker that tested positive for COVID-19.  He is not vaccinated.  Denies chest pain, shortness of breath, history of respiratory disorders.  Denies confusion, dizziness, weakness on one side of the body, hematuria.  He is a smoker.  No current facility-administered medications for this encounter.  Current Outpatient Medications:    amLODipine (NORVASC) 10 MG tablet, Take 1 tablet by mouth once daily for blood pressure, Disp: 60 tablet, Rfl: 0   escitalopram (LEXAPRO) 10 MG tablet, Take 1 tablet (10 mg total) by mouth daily., Disp: 90 tablet, Rfl: 3   fluticasone (FLONASE) 50 MCG/ACT nasal spray, Place 1-2 sprays into both nostrils daily for 7 days., Disp: 1 g, Rfl: 0   ibuprofen (ADVIL) 800 MG tablet, Take 1 tablet (800 mg total) by mouth 3 (three) times daily., Disp: 21 tablet, Rfl: 0   sildenafil (VIAGRA) 25 MG tablet, Take 1 tablet (25 mg total) by mouth daily as needed for erectile dysfunction., Disp: 30 tablet, Rfl: 0   triamcinolone ointment (KENALOG) 0.5 %, Apply 1 application topically 3 (three) times daily., Disp: 90 g, Rfl: 0   No Known Allergies  Past Medical History:  Diagnosis Date   Hypertension      History reviewed. No pertinent surgical history.  Family History  Problem Relation Age of Onset   Diabetes Mother    Hypertension Mother    Healthy Father    Hypertension Father     Social History   Tobacco Use   Smoking status: Every Day    Packs/day: 0.50    Years: 24.00    Pack years: 12.00    Types: Cigarettes   Smokeless tobacco: Never  Vaping Use   Vaping Use: Never used  Substance Use Topics   Alcohol use: Yes    Comment: occ   Drug use: Never    ROS   Objective:   Vitals: BP (!) 154/84   Pulse 70   Temp  99 F (37.2 C)   Resp 20   SpO2 100%   Physical Exam Constitutional:      General: He is not in acute distress.    Appearance: Normal appearance. He is well-developed and normal weight. He is not ill-appearing, toxic-appearing or diaphoretic.  HENT:     Head: Normocephalic and atraumatic.     Right Ear: Tympanic membrane, ear canal and external ear normal. There is no impacted cerumen.     Left Ear: Tympanic membrane, ear canal and external ear normal. There is no impacted cerumen.     Nose: Nose normal. No congestion or rhinorrhea.     Mouth/Throat:     Mouth: Mucous membranes are moist.     Pharynx: Oropharynx is clear. No oropharyngeal exudate or posterior oropharyngeal erythema.  Eyes:     General: No scleral icterus.       Right eye: No discharge.        Left eye: No discharge.     Extraocular Movements: Extraocular movements intact.     Conjunctiva/sclera: Conjunctivae normal.     Pupils: Pupils are equal, round, and reactive to light.  Cardiovascular:     Rate and Rhythm: Normal rate and regular rhythm.     Heart sounds: Normal heart sounds. No murmur  heard.   No friction rub. No gallop.  Pulmonary:     Effort: Pulmonary effort is normal. No respiratory distress.     Breath sounds: Normal breath sounds. No stridor. No wheezing, rhonchi or rales.  Musculoskeletal:     Cervical back: Normal range of motion and neck supple. No rigidity. No muscular tenderness.  Neurological:     General: No focal deficit present.     Mental Status: He is alert and oriented to person, place, and time.     Cranial Nerves: No cranial nerve deficit or facial asymmetry.     Motor: No weakness.     Coordination: Romberg sign negative. Coordination normal.     Gait: Gait normal.     Deep Tendon Reflexes: Reflexes normal.  Psychiatric:        Mood and Affect: Mood normal.        Behavior: Behavior normal.        Thought Content: Thought content normal.     Assessment and Plan :   PDMP  not reviewed this encounter.  1. Viral URI with cough   2. Sinus headache   3. Essential hypertension     Will manage for viral illness such as viral URI, viral syndrome, viral rhinitis, COVID-19. Counseled patient on nature of COVID-19 including modes of transmission, diagnostic testing, management and supportive care.  Offered scripts for symptomatic relief. COVID 19 testing is pending.  Regarding his blood pressure, patient does not have any signs of stroke.  Recommended follow-up with his PCP.  Counseled patient on potential for adverse effects with medications prescribed/recommended today, ER and return-to-clinic precautions discussed, patient verbalized understanding.     Jaynee Eagles, PA-C 05/11/21 1007

## 2021-05-11 NOTE — ED Triage Notes (Signed)
Pt reports a co -worker is positive or COVID . Pt has a new cough and HA.

## 2021-08-07 ENCOUNTER — Ambulatory Visit (HOSPITAL_COMMUNITY)
Admission: EM | Admit: 2021-08-07 | Discharge: 2021-08-07 | Disposition: A | Discharge status: Home / Self Care | Payer: BLUE CROSS/BLUE SHIELD | Attending: Emergency Medicine | Admitting: Emergency Medicine

## 2021-08-07 ENCOUNTER — Encounter (HOSPITAL_COMMUNITY): Payer: Self-pay | Admitting: Emergency Medicine

## 2021-08-07 ENCOUNTER — Other Ambulatory Visit: Payer: Self-pay

## 2021-08-07 DIAGNOSIS — F17210 Nicotine dependence, cigarettes, uncomplicated: Secondary | ICD-10-CM | POA: Insufficient documentation

## 2021-08-07 DIAGNOSIS — R1110 Vomiting, unspecified: Secondary | ICD-10-CM

## 2021-08-07 DIAGNOSIS — R112 Nausea with vomiting, unspecified: Secondary | ICD-10-CM | POA: Diagnosis not present

## 2021-08-07 DIAGNOSIS — Z20822 Contact with and (suspected) exposure to covid-19: Secondary | ICD-10-CM | POA: Diagnosis not present

## 2021-08-07 DIAGNOSIS — Z79899 Other long term (current) drug therapy: Secondary | ICD-10-CM | POA: Diagnosis not present

## 2021-08-07 DIAGNOSIS — R197 Diarrhea, unspecified: Secondary | ICD-10-CM | POA: Diagnosis not present

## 2021-08-07 LAB — SARS CORONAVIRUS 2 (TAT 6-24 HRS): SARS Coronavirus 2: NEGATIVE

## 2021-08-07 NOTE — ED Provider Notes (Signed)
[facility]    CSN: [id number] Arrival date & time: 08/07/21  1009      History   Chief Complaint Chief Complaint  Patient presents with   Emesis   Anorexia    HPI Gerald Mills is a 40 y.o. male.   Patient here for evaluation of nausea, vomiting, diarrhea, fatigue, and loss of appetite that has been ongoing since Monday.  Reports last vomiting or diarrhea was on Wednesday.  Reports taking some ibuprofen as needed for pain.  Denies any other OTC medications or treatments.  States that work is requiring him to be tested for [place] and a work note prior to allowing him to return.  Denies any trauma, injury, or other precipitating event.  Denies any specific alleviating or aggravating factors.  Denies any fevers, chest pain, shortness of breath, N/V/D, numbness, tingling, weakness, abdominal pain, or headaches.    The history is provided by the patient.  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 No pertinent surgical history.     Home Medications    Prior to Admission medications   Medication Sig Start Date End Date Taking? Authorizing Provider  amLODipine (NORVASC) 10 MG tablet Take 1 tablet by mouth once daily for blood pressure 01/22/21   Matilde Haymaker, MD  cetirizine (ZYRTEC ALLERGY) 10 MG tablet Take 1 tablet (10 mg total) by mouth daily. 05/11/21   Jaynee Eagles, PA-C  escitalopram (LEXAPRO) 10 MG tablet Take 1 tablet (10 mg total) by mouth daily. 03/16/21   Matilde Haymaker, MD  fluticasone [facility]) 50 MCG/ACT nasal spray Place 1-2 sprays into both nostrils daily for 7 days. 05/11/21 05/18/21  Jaynee Eagles, PA-C  ibuprofen (ADVIL) 800 MG tablet Take 1 tablet (800 mg total) by mouth 3 (three) times daily. 07/25/20   Wieters, Hallie C, PA-C  promethazine-dextromethorphan (PROMETHAZINE-DM) 6.25-15 MG/5ML syrup Take 5 mLs by mouth at bedtime as needed for cough. 05/11/21   Jaynee Eagles, PA-C  promethazine-dextromethorphan (PROMETHAZINE-DM) 6.25-15 MG/5ML syrup Take 5 mLs by mouth at bedtime as needed for cough. 05/11/21   Jaynee Eagles, PA-C  sildenafil (VIAGRA) 25 MG tablet Take 1 tablet (25 mg total) by mouth daily as needed for erectile dysfunction. 03/16/21   Matilde Haymaker, MD  triamcinolone ointment (KENALOG) 0.5 % Apply 1 application topically 3 (three) times daily. 09/25/20   Scot Jun, FNP  furosemide (LASIX) 20 MG tablet Take 1 tablet (20 mg total) by mouth daily for 7 days. 09/25/20 03/03/21  Scot Jun, FNP  hydrochlorothiazide (HYDRODIURIL) 25 MG tablet Take 1 tablet (25 mg total) by mouth daily. 04/04/17 08/25/19  Marney Setting, NP  lisinopril-hydrochlorothiazide (ZESTORETIC) 10-12.5 MG tablet Take 1 tablet by mouth daily. 05/21/19 08/25/19  Sharion Balloon, NP    Family History Family History  Problem Relation Age of Onset   Diabetes Mother    Hypertension  Mother    Healthy Father    Hypertension Father     Social History Social History   Tobacco Use   Smoking status: Every Day    Packs/day: 0.50    Years: 24.00    Pack years: 12.00    Types: Cigarettes   Smokeless tobacco: Never  Vaping Use   Vaping Use: Never used  Substance Use Topics   Alcohol use: Yes    Comment: occ   Drug use: Never     Allergies   Patient has no known allergies.   Review of Systems Review of Systems  Constitutional:  Positive for appetite  change. Negative for fever.  Gastrointestinal:  Positive for diarrhea, nausea and vomiting.  All other systems reviewed and are negative.   Physical Exam Triage Vital Signs ED Triage Vitals [08/07/21 1021]  Enc Vitals Group     BP (!) 179/111     Pulse Rate (!) 56     Resp 16     Temp 98.4 F (36.9 C)     Temp Source Oral     SpO2 97 %     Weight      Height      Head Circumference      Peak Flow      Pain Score 0     Pain Loc      Pain Edu?      Excl. in [place]?    No data found.  Updated Vital Signs BP (!) 179/111 (BP Location: Left Arm) Comment: doesnt like taking HTN that was told to  Pulse (!) 56   Temp 98.4 F (36.9 C) (Oral)   Resp 16   SpO2 97%   Visual Acuity Right Eye Distance:   Left Eye Distance:   Bilateral Distance:    Right Eye Near:   Left Eye Near:    Bilateral Near:     Physical Exam Vitals and nursing note reviewed.  Constitutional:      General: He is not in acute distress.    Appearance: Normal appearance. He is not ill-appearing, toxic-appearing or diaphoretic.  HENT:     Head: Normocephalic and atraumatic.     Nose: No congestion.  Eyes:     Conjunctiva/sclera: Conjunctivae normal.  Cardiovascular:     Rate and Rhythm: Normal rate and regular rhythm.     Pulses: Normal pulses.     Heart sounds: Normal heart sounds.  Pulmonary:     Effort: Pulmonary effort is normal.     Breath sounds: Normal breath sounds.  Abdominal:     General: Abdomen is flat.     Palpations: Abdomen is soft.     Tenderness: There is no right CVA tenderness or left CVA tenderness.  Musculoskeletal:        General: Normal range of motion.     Cervical back: Normal range of motion.  Skin:    General: Skin is warm and dry.  Neurological:     General: No focal deficit present.     Mental Status: He is alert and oriented to person, place, and time.  Psychiatric:        Mood and Affect: Mood normal.     UC Treatments / Results  Labs (all labs ordered are  listed, but only abnormal results are displayed) Labs Reviewed  SARS CORONAVIRUS 2 (TAT 6-24 HRS)    EKG   Radiology No results found.  Procedures Procedures (including critical care time)  Medications Ordered in UC Medications -  No data to display  Initial Impression / Assessment and Plan / UC Course  I have reviewed the triage vital signs and the nursing notes.  Pertinent labs & imaging results that were available during my care of the patient were reviewed by me and considered in my medical decision making (see chart for details).    Assessment negative for red flags or concerns.  Likely a viral gastritis with vomiting and diarrhea.  COVID test pending.  Recommend a brat or bland food diet and then advance as tolerated.  Encourage fluids and rest.  Tylenol and/or ibuprofen as needed.  Follow-up with primary care for reevaluation. Final Clinical Impressions(s) / UC Diagnoses   Final diagnoses:  Vomiting and diarrhea     Discharge Instructions      Try a BRAT (bananas, rice, applesause, toast) or bland food diet for the next few days.  Stick with foods that are gentle on your stomach.  Avoid foods that are difficult to digest, such as diary.  As you feel better, you can start eating like you do normally.    You can use ginger (ginger ale, ginger candy) and mint for nausea.   You can take Tylenol and/or Ibuprofen as needed for pain relief and fever reduction.   Make sure you are drinking plenty of fluids, such as water, powerade/gatorade, pedialyte, juices, and teas.    Return or go to the Emergency Department if symptoms worsen or do not improve in the next few days.      ED Prescriptions   None    PDMP not reviewed this encounter.   Pearson Forster, NP 08/07/21 1052

## 2021-08-07 NOTE — ED Triage Notes (Signed)
Since Monday had n/v/d, fatigue and loss of appetite. Pt was out of work and now needs note to return back. Pt reports last vomiting and diarrhea was on Wed.

## 2021-08-07 NOTE — Discharge Instructions (Signed)
Try a BRAT (bananas, rice, applesause, toast) or bland food diet for the next few days.  Stick with foods that are gentle on your stomach.  Avoid foods that are difficult to digest, such as diary.  As you feel better, you can start eating like you do normally.    You can use ginger (ginger ale, ginger candy) and mint for nausea.   You can take Tylenol and/or Ibuprofen as needed for pain relief and fever reduction.   Make sure you are drinking plenty of fluids, such as water, powerade/gatorade, pedialyte, juices, and teas.    Return or go to the Emergency Department if symptoms worsen or do not improve in the next few days.

## 2021-08-12 ENCOUNTER — Ambulatory Visit: Payer: BLUE CROSS/BLUE SHIELD | Admitting: Student

## 2021-08-12 ENCOUNTER — Ambulatory Visit (INDEPENDENT_AMBULATORY_CARE_PROVIDER_SITE_OTHER): Payer: BLUE CROSS/BLUE SHIELD | Admitting: Student

## 2021-08-12 ENCOUNTER — Other Ambulatory Visit: Payer: Self-pay

## 2021-08-12 ENCOUNTER — Encounter: Payer: Self-pay | Admitting: Student

## 2021-08-12 ENCOUNTER — Other Ambulatory Visit (HOSPITAL_COMMUNITY)
Admission: RE | Admit: 2021-08-12 | Discharge: 2021-08-12 | Disposition: A | Discharge status: Home / Self Care | Payer: BLUE CROSS/BLUE SHIELD | Source: Ambulatory Visit | Attending: Family Medicine | Admitting: Family Medicine

## 2021-08-12 VITALS — BP 165/99 | HR 57 | Ht 76.0 in | Wt 254.4 lb

## 2021-08-12 DIAGNOSIS — A64 Unspecified sexually transmitted disease: Secondary | ICD-10-CM | POA: Insufficient documentation

## 2021-08-12 DIAGNOSIS — Z113 Encounter for screening for infections with a predominantly sexual mode of transmission: Secondary | ICD-10-CM

## 2021-08-12 DIAGNOSIS — I10 Essential (primary) hypertension: Secondary | ICD-10-CM

## 2021-08-12 MED ORDER — CEFTRIAXONE SODIUM 1 G IJ SOLR: 1.0000 g | Freq: Once | INTRAMUSCULAR | Status: DC

## 2021-08-12 MED ORDER — DOXYCYCLINE HYCLATE 100 MG PO TABS: 100.0000 mg | ORAL_TABLET | Freq: Two times a day (BID) | ORAL | 0 refills | Status: AC

## 2021-08-12 MED ORDER — LOSARTAN POTASSIUM-HCTZ 50-12.5 MG PO TABS: 1.0000 | ORAL_TABLET | Freq: Every day | ORAL | 3 refills | Status: DC

## 2021-08-12 NOTE — Patient Instructions (Signed)
G[place] Mills It is such a joy to take care you! Thank you for coming in today.   As a reminder, here is a recap of what we talked about today:  -I am going to go with and treat you for gonorrhea and chlamydia.  We will test you for other infections and I will call you if any of these test come up positive. -Your blood pressure is quite high today and it looks like it has been there for a while.  I am going to put you on a combination blood pressure pill that you will take once daily.  You will need to come in and see your PCP in the next 2 to 4 weeks to follow-up on this.  I recommend that you always bring your medications to each appointment as this makes it easy to ensure we are on the correct medications and helps [place] not miss when refills are needed.  Take care and seek immediate care sooner if you develop any concerns.   Marnee Guarneri, MD [place]

## 2021-08-12 NOTE — Progress Notes (Deleted)
    SUBJECTIVE:   CHIEF COMPLAINT / HPI:   Htn -amlodipine 10  Tinea cruris -clotrimazole  Ed Sildenafil Dizzy?  Snoring Stop breathing? Sleep study?  Mood escitaloprm  PERTINENT  PMH / PSH: htn, edn, smoker  OBJECTIVE:   There were no vitals taken for this visit.  ***  ASSESSMENT/PLAN:   No problem-specific Assessment & Plan notes found for this encounter.     Gerrit Heck, MD [place]

## 2021-08-12 NOTE — Assessment & Plan Note (Signed)
Patient with reported exposure to chlamydia.  Will treat empirically for chlamydia as well as gonorrhea. -Doxycycline 100 mg twice daily for 7 days -Rocephin 1 g injection  -Clinic was out of stock, patient to return on 10/6 for injection -Will also test for trichomoniasis, HIV, RPR, hep C

## 2021-08-12 NOTE — Assessment & Plan Note (Signed)
Poorly controlled.  Patient not taking prescribed amlodipine.  As patient desires to minimize pill burden we will trial combination medication at this time. -Losartan-hydrochlorothiazide 50-12.5 mg -PCP follow-up in 2 to 3 weeks for BP check and BMP

## 2021-08-12 NOTE — Progress Notes (Signed)
    SUBJECTIVE:   CHIEF COMPLAINT / HPI:  Exposure to Sexually Transmitted Disease Patient reports recent exposure to a male sexual partner who subsequently tested positive for chlamydia.  He is requesting STD screening today.  He does endorse urethral burning with ejaculation but denies any dysuria or penile discharge.  Hypertension Patient's blood pressure noted to be elevated on presentation to 165/99.  On further questioning patient reports that he has not been taking his amlodipine because "does not work for me."  Has previously tried and failed both HCTZ and Lasix.  PERTINENT  PMH / PSH: Hypertension  OBJECTIVE:   BP (!) 165/99   Pulse (!) 57   Ht 6\' 4"  (1.93 m)   Wt 254 lb 6.4 oz (115.4 kg)   SpO2 100%   BMI 30.97 kg/m   Physical Exam Vitals reviewed.  Constitutional:      Appearance: Normal appearance.  Cardiovascular:     Rate and Rhythm: Normal rate and regular rhythm.     Pulses: Normal pulses.     Heart sounds: No murmur heard. Pulmonary:     Effort: Pulmonary effort is normal.     Breath sounds: Normal breath sounds.  Skin:    General: Skin is warm and dry.     ASSESSMENT/PLAN:   Routine screening for STI (sexually transmitted infection) Patient with reported exposure to chlamydia.  Will treat empirically for chlamydia as well as gonorrhea. -Doxycycline 100 mg twice daily for 7 days -Rocephin 1 g injection  -Clinic was out of stock, patient to return on 10/6 for injection -Will also test for trichomoniasis, HIV, RPR, hep C  Hypertension Poorly controlled.  Patient not taking prescribed amlodipine.  As patient desires to minimize pill burden we will trial combination medication at this time. -Losartan-hydrochlorothiazide 50-12.5 mg -PCP follow-up in 2 to 3 weeks for BP check and BMP    Pearla Dubonnet, MD [place]

## 2021-08-13 ENCOUNTER — Ambulatory Visit: Payer: BLUE CROSS/BLUE SHIELD

## 2021-08-13 LAB — HIV ANTIBODY (ROUTINE TESTING W REFLEX): HIV Screen 4th Generation wRfx: NONREACTIVE

## 2021-08-13 LAB — RPR: RPR Ser Ql: NONREACTIVE

## 2021-08-13 LAB — URINE CYTOLOGY ANCILLARY ONLY
Chlamydia: NEGATIVE
Comment: NEGATIVE
Comment: NEGATIVE
Comment: NORMAL
Neisseria Gonorrhea: NEGATIVE
Trichomonas: NEGATIVE

## 2021-08-13 LAB — HCV AB W REFLEX TO QUANT PCR: HCV Ab: <0.1 s/co ratio (ref 0.0–0.9)

## 2021-08-13 LAB — HCV INTERPRETATION

## 2021-08-14 ENCOUNTER — Telehealth: Payer: Self-pay

## 2021-08-14 NOTE — Telephone Encounter (Signed)
Pt returning a call from just a couple of minutes ago. If you called him please give him a call back. Ottis Stain, CMA

## 2021-08-15 ENCOUNTER — Encounter: Payer: Self-pay | Admitting: Student

## 2021-08-17 ENCOUNTER — Ambulatory Visit: Payer: BLUE CROSS/BLUE SHIELD

## 2021-08-19 ENCOUNTER — Ambulatory Visit: Payer: BLUE CROSS/BLUE SHIELD

## 2021-09-08 ENCOUNTER — Emergency Department (HOSPITAL_COMMUNITY)
Admission: EM | Admit: 2021-09-08 | Discharge: 2021-09-08 | Disposition: A | Discharge status: Home / Self Care | Payer: BLUE CROSS/BLUE SHIELD | Attending: Physician Assistant | Admitting: Physician Assistant

## 2021-09-08 ENCOUNTER — Encounter (HOSPITAL_COMMUNITY): Payer: Self-pay | Admitting: Registered Nurse

## 2021-09-08 ENCOUNTER — Encounter (HOSPITAL_COMMUNITY): Payer: Self-pay

## 2021-09-08 ENCOUNTER — Ambulatory Visit (HOSPITAL_COMMUNITY)
Admission: EM | Admit: 2021-09-08 | Discharge: 2021-09-08 | Disposition: A | Discharge status: Home / Self Care | Payer: BLUE CROSS/BLUE SHIELD | Attending: Registered Nurse | Admitting: Registered Nurse

## 2021-09-08 DIAGNOSIS — R110 Nausea: Secondary | ICD-10-CM | POA: Diagnosis not present

## 2021-09-08 DIAGNOSIS — F1123 Opioid dependence with withdrawal: Secondary | ICD-10-CM | POA: Insufficient documentation

## 2021-09-08 DIAGNOSIS — I10 Essential (primary) hypertension: Secondary | ICD-10-CM | POA: Diagnosis not present

## 2021-09-08 DIAGNOSIS — R61 Generalized hyperhidrosis: Secondary | ICD-10-CM | POA: Diagnosis not present

## 2021-09-08 DIAGNOSIS — R112 Nausea with vomiting, unspecified: Secondary | ICD-10-CM | POA: Diagnosis not present

## 2021-09-08 DIAGNOSIS — F1110 Opioid abuse, uncomplicated: Secondary | ICD-10-CM | POA: Diagnosis not present

## 2021-09-08 DIAGNOSIS — Z5321 Procedure and treatment not carried out due to patient leaving prior to being seen by health care provider: Secondary | ICD-10-CM | POA: Diagnosis not present

## 2021-09-08 LAB — COMPREHENSIVE METABOLIC PANEL
ALT: 19 U/L (ref 0–44)
AST: 18 U/L (ref 15–41)
Albumin: 4.4 g/dL (ref 3.5–5.0)
Alkaline Phosphatase: 74 U/L (ref 38–126)
Anion gap: 7 (ref 5–15)
BUN: 12 mg/dL (ref 6–20)
CO2: 31 mmol/L (ref 22–32)
Calcium: 9.3 mg/dL (ref 8.9–10.3)
Chloride: 102 mmol/L (ref 98–111)
Creatinine, Ser: 0.99 mg/dL (ref 0.61–1.24)
GFR, Estimated: >60 mL/min (ref >60)
Glucose, Bld: 120 mg/dL — ABNORMAL HIGH (ref 70–99)
Potassium: 3.6 mmol/L (ref 3.5–5.1)
Sodium: 140 mmol/L (ref 135–145)
Total Bilirubin: 1.3 mg/dL — ABNORMAL HIGH (ref 0.3–1.2)
Total Protein: 7.6 g/dL (ref 6.5–8.1)

## 2021-09-08 LAB — ACETAMINOPHEN LEVEL: Acetaminophen (Tylenol), Serum: <10 ug/mL — ABNORMAL LOW (ref 10–30)

## 2021-09-08 LAB — SALICYLATE LEVEL: Salicylate Lvl: <7 mg/dL — ABNORMAL LOW (ref 7.0–30.0)

## 2021-09-08 LAB — CBC WITH DIFFERENTIAL/PLATELET
Abs Immature Granulocytes: 0 10*3/uL (ref 0.00–0.07)
Basophils Absolute: 0 10*3/uL (ref 0.0–0.1)
Basophils Relative: 0 %
Eosinophils Absolute: 0 10*3/uL (ref 0.0–0.5)
Eosinophils Relative: 1 %
HCT: 45.4 % (ref 39.0–52.0)
Hemoglobin: 14.7 g/dL (ref 13.0–17.0)
Immature Granulocytes: 0 %
Lymphocytes Relative: 49 %
Lymphs Abs: 1.6 10*3/uL (ref 0.7–4.0)
MCH: 29.4 pg (ref 26.0–34.0)
MCHC: 32.4 g/dL (ref 30.0–36.0)
MCV: 90.8 fL (ref 80.0–100.0)
Monocytes Absolute: 0.3 10*3/uL (ref 0.1–1.0)
Monocytes Relative: 8 %
Neutro Abs: 1.4 10*3/uL — ABNORMAL LOW (ref 1.7–7.7)
Neutrophils Relative %: 42 %
Platelets: 188 10*3/uL (ref 150–400)
RBC: 5 MIL/uL (ref 4.22–5.81)
RDW: 13.5 % (ref 11.5–15.5)
WBC: 3.3 10*3/uL — ABNORMAL LOW (ref 4.0–10.5)
nRBC: 0 % (ref 0.0–0.2)

## 2021-09-08 LAB — ETHANOL: Alcohol, Ethyl (B): <10 mg/dL (ref <10)

## 2021-09-08 NOTE — ED Triage Notes (Addendum)
Pt reports he wants to detox from opioids. Reports last used pain pills yesterday.   C/o withdrawals, sweats, and nausea.   A/ox4 Ambulatory in triage.   Hx: Hypertension (pt has not taken meds in a few days). BP high today.

## 2021-09-08 NOTE — Discharge Instructions (Addendum)
Hypertension: High Blood Pressure:  Take your medication that is prescribed for your blood pressure; Follow up with primary doctor or go to emergency room.     Substance Abuse Resources  [facility] - Admissions are currently completed Monday through Friday at [facility]; both appointments and walk-ins are accepted.  Any individual that is a [facility] resident may present for a substance abuse screening and assessment for admission.  A person may be referred by numerous sources or self-refer.   Potential clients will be screened for medical necessity and appropriateness for the program.  Clients must meet criteria for high-intensity residential treatment services.  If clinically appropriate, a client will continue with the comprehensive clinical assessment and intake process, as well as enrollment in the [facility] [place].   Address: [address] [place], [place] [address] Admin Hours: Mon-Fri 8AM to [place] Hours: 24/7 Phone: [phone] Fax: [phone]   [facility] (Detox) Facility Based Crisis:  These are 3 locations for services: Please call before arrival    Address: [address] [place], [place] [address] Phone: [phone]   Address: [address] [place], [place] [address] Phone#: [phone]   Address: [address] Gladis Riffle [place], [place] [address] Phone#: [phone]     [facility] ([facility]): (offers outpatient therapy and intensive outpatient substance abuse therapy).  [address], [place], [place] [address] Phone: [phone]   [facility]: Offers FREE recovery skills classes, support groups, 1:1 Peer Support, and Compeer Classes. [address], [place], [place] [address] Phone: [phone] (Call to complete intake).  [facility] [address] [place], [place] [address] Phone: [phone] ext: [facility] provides food,  shelter and other programs and services to the homeless men of [place] through our [employer].   By offering safe shelter, three meals a day, clean clothing, Biblical counseling, financial planning, vocational training, GED/education and employment assistance, we've helped mend the shattered lives of many homeless men since opening in 1974.   We have approximately 267 beds available, with a max of 312 beds including mats for emergency situations and currently house an average of 270 men a night.   Prospective Client Check-In Information Photo ID Required (State/ Out of State/ [facility]) - if photo ID is not available, clients are required to have a printout of a police/sheriff's criminal history report. Help out with chores around the [facility]. No sex offender of any type (pending, charged, registered and/or any other sex related offenses) will be permitted to check in. Must be willing to abide by all rules, regulations, and policies established by the [employer]. The following will be provided - shelter, food, clothing, and biblical counseling. If you or someone you know is in need of assistance at our [facility] shelter in [place], [place], please call [phone] ext. [phone].   [facility]-will provide timely access to mental health services for children and adolescents (4-17) and adults presenting in a mental health crisis. The program is designed for those who need urgent Behavioral Health or Substance Use treatment and are not experiencing a medical crisis that would typically require an emergency room visit.    [place],  [address] Phone: [phone] [email or web address]: Phone#: [phone]   The [facility] SA Intensive Outpatient Program (SAIOP) means structured individual and group addiction activities and services that are  provided at an outpatient program  designed to assist adult and adolescent consumers to begin recovery and learn skills for recovery maintenance. The [place] program is offered at least 3 hours a day, 3 days a week.SAIOP services shall include a structured program consisting of, but not limited to, the following services: Individual counseling and support; Group counseling and support; Family counseling, training or support; Biochemical assays to identify recent drug use (e.g., urine drug screens); Strategies for relapse prevention to include community and social support systems in treatment; Life skills; Crisis contingency planning; Disease Management; and Treatment support activities that have been adapted or specifically designed for persons with physical disabilities, or persons with co-occurring disorders of mental illness and substance abuse/dependence or mental retardation/developmental disability and substance abuse/dependence. Phone: [phone]     [facility]: [phone]  [place]: [phone]

## 2021-09-08 NOTE — ED Provider Notes (Signed)
Emergency Medicine Provider Triage Evaluation Note  G[place] , a 40 y.o. male  was evaluated in triage.  Pt complains of requesting detox from opioids.  Patient reports his last use was yesterday, he is requesting detox at this is his first time from narcotics.  He does report obtaining a leave of absence from his job, and called around however most places wanted a high co-pay.  Also endorses not taking his blood pressure medication in the last day, he is currently on 2 regimens.  Review of Systems  Positive: Nausea, sweats Negative: Vomiting, chest pain, shortness of breath  Physical Exam  There were no vitals taken for this visit. Gen:   Awake, no distress   Resp:  Normal effort  MSK:   Moves extremities without difficulty  Other:    Medical Decision Making  Medically screening exam initiated at 12:33 PM.  Appropriate orders placed.  ESCO JOSLYN was informed that the remainder of the evaluation will be completed by another provider, this initial triage assessment does not replace that evaluation, and the importance of remaining in the ED until their evaluation is complete.  Patient here requesting detox.  We discussed that we do not have in-house detox, however once medically stable we could refer him to home outpatient places.  Vitals are within normal limits aside from elevation in his blood pressure, which she reports missing his blood pressure medication yesterday and today.   Janeece Fitting, PA-C 09/08/21 1237    Charlesetta Shanks, MD 09/15/21 [phone]

## 2021-09-08 NOTE — ED Triage Notes (Signed)
Patient's mother called concerned we were going turn him away-mother states he has been taking some kind of pills that makes him "spacey" and then he has a mental crisis-states she is worried about him-states patient lives with her-states he has mood swings-states he has had self harm thoughts but denied during triage

## 2021-09-08 NOTE — Progress Notes (Signed)
   09/08/21 1759  [place] (Walk-ins at [facility] only)  How Did You Hear About [place]? Self  What Is the Reason for Your Visit/Call Today? 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 Pt stated that he thought he had reatment lined up but could not follow through due to the large co-pay they required to start treatment. Pt stated he was loking for detox and ST treatment. Pt denied SI, HI, NSSH, AVH, paranoia and reported only occasional alcohol use in addition to the use of opioids. Pt stated he has been using opioids daily (if possible) in the form of Roxies and Percocet mainly and takes as many as 15 pills per day. No IV use.  How Long Has This Been Causing You Problems? > than 6 months  Have You Recently Had Any Thoughts About Hurting Yourself? No  Are You Planning to Commit Suicide/Harm Yourself At This time? No  Have you Recently Had Thoughts About [place]? No  Are You Planning To Harm Someone At This Time? No  Are you currently experiencing any auditory, visual or other hallucinations? No  Have You Used Any Alcohol or Drugs in the Past 24 Hours? No  Do you have any current medical co-morbidities that require immediate attention? Yes  Please describe current medical co-morbidities that require immediate attention: Pt's BP was high when taken during screening. Pt has been prescribed BP medication but does not take it due to unwanted side effects.  Clinician description of patient physical appearance/behavior: Pt was calm, cooperative and polite.Pt was casually dressed and adequately groomed.  Pt's mood was somewhat anxious and his affect was congruent. Pt's speecha nd movement was within normal limits. Pt was alert and oriented x 4.  If access to [facility] was not available, would you have sought care in  the Emergency Department? Yes  Determination of Need Routine (7 days) (Per Shuvon Rankin, NP pt is Routine and in need or rfor drug treatment.)  Options For Referral Chemical Dependency Intensive Outpatient Therapy (CDIOP)  Taelyn Nemes T. Mare Ferrari, [place], [facility], [facility] Triage Specialist [facility]

## 2021-09-08 NOTE — Discharge Summary (Signed)
[name] to be D/C'd Home per NP order. Discussed with the patient and all questions fully answered. An After Visit Summary was printed and given to the patient. Patient escorted out and D/C home via private auto.  Clois Dupes  09/08/2021 6:12 PM

## 2021-09-08 NOTE — ED Provider Notes (Signed)
Behavioral Health Urgent Care Medical Screening Exam  Patient Name: Gerald Mills MRN: [id number] Date of Evaluation: 09/08/21 Chief Complaint:   Diagnosis:  Final diagnoses:  Opiate abuse, continuous ([place])  Essential hypertension    History of Present illness: Gerald Mills is a 40 y.o. male patient presented to [facility] as a walk in accompanied by a friend with complaints of requesting opiate detox  Gerald Mills, 40 y.o., male patient seen face to face by this provider, consulted with Dr. Ernie Hew; and chart reviewed on 09/08/21.  On evaluation Gerald Mills reports he was seen earlier today in the emergency room but did not feel like he was given the services that he needed.  States he is looking for assistance with opiate detox.  Patient states he was scheduled to start [facility] detox program on Monday but when he got there he had to pay $2000 upfront "and I don't have  that type of money.  Now before I can go back to work I have got a get into a detox program or some type of program under doctors care and a note to give the HR."  Patient denies suicidal/self-harm/homicidal ideations, psychosis, paranoia. During evaluation Gerald Mills is sitting upright in chair in no acute distress.  He is alert/oriented x 4; calm/cooperative; and mood congruent with affect.  He is speaking in a clear tone at moderate volume, and normal pace; with good eye contact.  His thought process is coherent and relevant; There is no indication that he is currently responding to internal/external stimuli or experiencing delusional thought content; and he has denied suicidal/self-harm/homicidal ideation, psychosis, and paranoia.   Patient has remained calm throughout assessment and has answered questions appropriately.    At this time Gerald Mills is educated and verbalizes understanding of mental health resources and other crisis services in the community. He is instructed to call 911 and present  to the nearest emergency room should he experience any suicidal/homicidal ideation, auditory/visual/hallucinations, or detrimental worsening of his mental health condition.  He was a also advised by Probation officer that he could call the toll-free phone on insurance card to assist with identifying in network counselors and agencies.    Psychiatric Specialty Exam  Presentation  General Appearance:Appropriate for Environment; Casual  Eye Contact:Good  Speech:Clear and Coherent; Normal Rate  Speech Volume:Normal  Handedness:Right   Mood and Affect  Mood:Euthymic  Affect:Appropriate; Congruent   Thought Process  Thought Processes:Coherent; Goal Directed  Descriptions of Associations:Intact  Orientation:Full (Time, Place and Person)  Thought Content:WDL    Hallucinations:None  Ideas of Reference:None  Suicidal Thoughts:No  Homicidal Thoughts:No   Sensorium  Memory:Immediate Good; Recent Good; Remote Good  Judgment:Intact  Insight:Present   Executive Functions  Concentration:Good  Attention Span:Good  [place]  Language:Good   Psychomotor Activity  Psychomotor Activity:Normal   Assets  Assets:Communication Skills; Desire for Improvement; Financial Resources/Insurance; Housing; Physical Health; Resilience; Social Support   Sleep  Sleep:Good  Number of hours: No data recorded  Nutritional Assessment (For OBS and FBC admissions only) Has the patient had a weight loss or gain of 10 pounds or more in the last 3 months?: No Has the patient had a decrease in food intake/or appetite?: No Does the patient have dental problems?: No Does the patient have eating habits or behaviors that may be indicators of an eating disorder including binging or inducing vomiting?: No Has the patient recently lost weight without trying?: 0 Has the  patient been eating poorly because of a decreased appetite?: 0 Malnutrition Screening Tool Score:  0    Physical Exam: Physical Exam Vitals and nursing note reviewed. Exam conducted with a chaperone present.  Constitutional:      General: He is not in acute distress.    Appearance: Normal appearance. He is not ill-appearing.  HENT:     Head: Normocephalic.  Eyes:     Pupils: Pupils are equal, round, and reactive to light.  Cardiovascular:     Rate and Rhythm: Normal rate.     Comments: Elevated blood pressure.  Reporting he has not taken his blood pressure medication.  See in emergency room earlier today. Pulmonary:     Effort: Pulmonary effort is normal.  Musculoskeletal:        General: Normal range of motion.     Cervical back: Normal range of motion.  Skin:    General: Skin is warm and dry.  Neurological:     Mental Status: He is alert and oriented to person, place, and time.  Psychiatric:        Attention and Perception: Attention and perception normal. He does not perceive auditory or visual hallucinations.        Mood and Affect: Mood and affect normal.        Speech: Speech normal.        Behavior: Behavior normal. Behavior is cooperative.        Thought Content: Thought content normal. Thought content is not paranoid or delusional. Thought content does not include homicidal or suicidal ideation.        Cognition and Memory: Cognition and memory normal.        Judgment: Judgment normal.   Review of Systems  Constitutional: Negative.   HENT: Negative.    Eyes: Negative.   Respiratory: Negative.  Negative for cough and shortness of breath.   Cardiovascular: Negative.  Negative for chest pain and palpitations.       Patient is aware that blood pressure is high.  States he has not been taking his blood pressure medication related to the effects on sexual function.  Patient encouraged to take medications and to follow up with his primary doctor.  Go to ED   Gastrointestinal: Negative.   Genitourinary: Negative.   Musculoskeletal: Negative.   Skin: Negative.    Neurological: Negative.  Negative for headaches.  Endo/Heme/Allergies: Negative.   Psychiatric/Behavioral:  Positive for substance abuse (Opiates use disorder and is looking for detox). Depression: Denies. Hallucinations: Denies. Suicidal ideas: Denies.Nervous/anxious: Denies. Insomnia: Denies.        States he took off work so could go to detox at [facility] but waned $2000.00 up front and wasn't able to pay.  Now has to find help in some form of treatment and doctors note before he can go back to work or he'll lose his job.    Blood pressure (!) 189/118, pulse 60, temperature 98.4 F (36.9 C), temperature source Oral, resp. rate 18, SpO2 100 %. There is no height or weight on file to calculate BMI.  Musculoskeletal: Strength & Muscle Tone: within normal limits Gait & Station: normal Patient leans: N/A   [place] MSE Discharge Disposition for Follow up and Recommendations: Based on my evaluation the patient does not appear to have an emergency medical condition and can be discharged with resources and follow up care in outpatient services for Medication Management, Partial Hospitalization Program, Substance Abuse Intensive Outpatient Program, and Individual Therapy Patient encouraged to follow  up with primary doctor or go to emergency room for elevated blood pressure    Follow-up Information     [facility], [facility].   Specialty: Behavioral Health Why: [facility] Triage Times (no appointment necessary).  (offers outpatient therapy and intensive outpatient substance abuse therapy). Contact information: [address] [place] [address] [phone]         [facility].   Why: Admissions are currently completed Monday through Friday at [facility]; both appointments and walk-ins are accepted.  Any individual that is a [facility] resident may present for a substance abuse screening and assessment for admission Contact information: [address] [place] [address] [phone]         [facility] [place].   Why: Admissions are currently completed Monday through Friday at [facility]; both appointments and walk-ins are accepted. Any individual that is a [facility] resident may present for a substance abuse screening and assessment for admission Contact information: [place] [address] [phone]                  Discharge Instructions      Hypertension: High Blood Pressure:  Take your medication that is prescribed for your blood pressure; Follow up with primary doctor or go to emergency room.     Substance Abuse Resources  [facility] - Admissions are currently completed Monday through Friday at [facility]; both appointments and walk-ins are accepted.  Any individual that is a [facility] resident may present for a substance abuse screening and assessment for admission.  A person may be referred by numerous sources or self-refer.   Potential clients will be screened for medical necessity and appropriateness for the program.  Clients must meet criteria for high-intensity residential treatment services.  If clinically appropriate, a client will continue with the comprehensive clinical assessment and intake process, as well as enrollment in the [place].   Address: [address] [place], [place] [address] Admin Hours: Mon-Fri 8AM to [place] Hours: 24/7 Phone: [phone] Fax: [phone]   [facility] (Detox) Facility Based Crisis:  These are 3 locations for services: Please call before arrival    Address: [address] [place], [place] [address] Phone: [phone]   Address: [address] [place], [place] [address] Phone#: [phone]   Address: [address] [place], [place] [address] Phone#: [phone]     [facility] ([facility]): (offers outpatient therapy and intensive outpatient  substance abuse therapy).  [address], [place], [place] [address] Phone: [phone]   [facility]: Offers FREE recovery skills classes, support groups, 1:1 Peer Support, and Compeer Classes. [address], [place], [place] [address] Phone: [phone] (Call to complete intake).  [facility] [address] [place], [place] [address] Phone: [phone] ext: [facility] provides food, shelter and other programs and services to the homeless men of [place] through our [employer].   By offering safe shelter, three meals a day, clean clothing, Biblical counseling, financial planning, vocational training, GED/education and employment assistance, we've helped mend the shattered lives of many homeless men since opening in 1974.   We have approximately 267 beds available, with a max of 312 beds including mats for emergency situations and currently house an average of 270 men a night.   Prospective Client Check-In Information Photo ID  Required (State/ Out of State/ [facility]) - if photo ID is not available, clients are required to have a printout of a police/sheriff's criminal history report. Help out with chores around the [facility]. No sex offender of any type (pending, charged, registered and/or any other sex related offenses) will be permitted to check in. Must be willing to abide by all rules, regulations, and policies established by the [employer]. The following will be provided - shelter, food, clothing, and biblical counseling. If you or someone you know is in need of assistance at our [facility] shelter in [place], [place], please call [phone] ext. [phone].   [facility]-will provide timely access to mental health services for children and adolescents (4-17) and adults presenting in a mental health crisis. The program is designed for those who need urgent Behavioral Health or  Substance Use treatment and are not experiencing a medical crisis that would typically require an emergency room visit.    [place], [place] [address] Phone: [phone] [email or web address]: Phone#: [phone]   The [facility] SA Intensive Outpatient Program (SAIOP) means structured individual and group addiction activities and services that are provided at an outpatient program designed to assist adult and adolescent consumers to begin recovery and learn skills for recovery maintenance. The [place] program is offered at least 3 hours a day, 3 days a week.SAIOP services shall include a structured program consisting of, but not limited to, the following services: Individual counseling and support; Group counseling and support; Family counseling, training or support; Biochemical assays to identify recent drug use (e.g., urine drug screens); Strategies for relapse prevention to include community and social support systems in treatment; Life skills; Crisis contingency planning; Disease Management; and Treatment support activities that have been adapted or specifically designed for persons with physical disabilities, or persons with co-occurring disorders of mental illness and substance abuse/dependence or mental retardation/developmental disability and substance abuse/dependence. Phone: [phone]     The [place]: [phone]  Behavioral Health Crisis Line: [phone]            Earleen Newport, NP 09/08/2021, 6:01 PM

## 2021-09-10 ENCOUNTER — Telehealth (HOSPITAL_COMMUNITY): Payer: Self-pay | Admitting: Student

## 2021-09-10 NOTE — BH Assessment (Signed)
Care Management - Follow Up [facility] Discharges   Writer attempted to make contact with patient today and was unsuccessful.  Writer left a HIPPA compliant voice message.   

## 2021-09-14 DIAGNOSIS — F1120 Opioid dependence, uncomplicated: Secondary | ICD-10-CM | POA: Diagnosis not present

## 2021-09-15 DIAGNOSIS — F1120 Opioid dependence, uncomplicated: Secondary | ICD-10-CM | POA: Diagnosis not present

## 2021-09-15 DIAGNOSIS — R001 Bradycardia, unspecified: Secondary | ICD-10-CM | POA: Diagnosis not present

## 2021-09-15 DIAGNOSIS — F1290 Cannabis use, unspecified, uncomplicated: Secondary | ICD-10-CM | POA: Diagnosis not present

## 2021-09-15 DIAGNOSIS — I10 Essential (primary) hypertension: Secondary | ICD-10-CM | POA: Diagnosis not present

## 2021-09-15 DIAGNOSIS — F17200 Nicotine dependence, unspecified, uncomplicated: Secondary | ICD-10-CM | POA: Diagnosis not present

## 2021-09-16 DIAGNOSIS — F1120 Opioid dependence, uncomplicated: Secondary | ICD-10-CM | POA: Diagnosis not present

## 2021-09-17 DIAGNOSIS — F1120 Opioid dependence, uncomplicated: Secondary | ICD-10-CM | POA: Diagnosis not present

## 2021-09-18 DIAGNOSIS — R030 Elevated blood-pressure reading, without diagnosis of hypertension: Secondary | ICD-10-CM | POA: Diagnosis not present

## 2021-09-18 DIAGNOSIS — F1120 Opioid dependence, uncomplicated: Secondary | ICD-10-CM | POA: Diagnosis not present

## 2021-09-18 DIAGNOSIS — I10 Essential (primary) hypertension: Secondary | ICD-10-CM | POA: Diagnosis not present

## 2021-09-18 DIAGNOSIS — F17210 Nicotine dependence, cigarettes, uncomplicated: Secondary | ICD-10-CM | POA: Diagnosis not present

## 2021-09-18 DIAGNOSIS — Z79899 Other long term (current) drug therapy: Secondary | ICD-10-CM | POA: Diagnosis not present

## 2021-09-18 DIAGNOSIS — I160 Hypertensive urgency: Secondary | ICD-10-CM | POA: Diagnosis not present

## 2021-09-19 DIAGNOSIS — F1120 Opioid dependence, uncomplicated: Secondary | ICD-10-CM | POA: Diagnosis not present

## 2021-09-20 DIAGNOSIS — F1120 Opioid dependence, uncomplicated: Secondary | ICD-10-CM | POA: Diagnosis not present

## 2021-09-21 DIAGNOSIS — F1120 Opioid dependence, uncomplicated: Secondary | ICD-10-CM | POA: Diagnosis not present

## 2021-09-22 DIAGNOSIS — F1120 Opioid dependence, uncomplicated: Secondary | ICD-10-CM | POA: Diagnosis not present

## 2021-09-23 DIAGNOSIS — I10 Essential (primary) hypertension: Secondary | ICD-10-CM | POA: Diagnosis not present

## 2021-09-23 DIAGNOSIS — Z7689 Persons encountering health services in other specified circumstances: Secondary | ICD-10-CM | POA: Diagnosis not present

## 2021-09-23 DIAGNOSIS — Z0000 Encounter for general adult medical examination without abnormal findings: Secondary | ICD-10-CM | POA: Diagnosis not present

## 2021-09-23 DIAGNOSIS — F1111 Opioid abuse, in remission: Secondary | ICD-10-CM | POA: Diagnosis not present

## 2021-09-23 DIAGNOSIS — F1120 Opioid dependence, uncomplicated: Secondary | ICD-10-CM | POA: Diagnosis not present

## 2021-09-24 DIAGNOSIS — F1120 Opioid dependence, uncomplicated: Secondary | ICD-10-CM | POA: Diagnosis not present

## 2021-09-25 DIAGNOSIS — F1120 Opioid dependence, uncomplicated: Secondary | ICD-10-CM | POA: Diagnosis not present

## 2021-09-26 DIAGNOSIS — F1120 Opioid dependence, uncomplicated: Secondary | ICD-10-CM | POA: Diagnosis not present

## 2021-09-27 DIAGNOSIS — F1120 Opioid dependence, uncomplicated: Secondary | ICD-10-CM | POA: Diagnosis not present

## 2021-09-28 DIAGNOSIS — F1120 Opioid dependence, uncomplicated: Secondary | ICD-10-CM | POA: Diagnosis not present

## 2021-09-29 DIAGNOSIS — F1120 Opioid dependence, uncomplicated: Secondary | ICD-10-CM | POA: Diagnosis not present

## 2021-09-30 DIAGNOSIS — F1120 Opioid dependence, uncomplicated: Secondary | ICD-10-CM | POA: Diagnosis not present

## 2021-10-01 DIAGNOSIS — F1120 Opioid dependence, uncomplicated: Secondary | ICD-10-CM | POA: Diagnosis not present

## 2021-10-02 DIAGNOSIS — F1120 Opioid dependence, uncomplicated: Secondary | ICD-10-CM | POA: Diagnosis not present

## 2021-10-03 DIAGNOSIS — F1120 Opioid dependence, uncomplicated: Secondary | ICD-10-CM | POA: Diagnosis not present

## 2021-10-05 DIAGNOSIS — F1120 Opioid dependence, uncomplicated: Secondary | ICD-10-CM | POA: Diagnosis not present

## 2021-10-06 DIAGNOSIS — F1120 Opioid dependence, uncomplicated: Secondary | ICD-10-CM | POA: Diagnosis not present

## 2021-10-07 DIAGNOSIS — I10 Essential (primary) hypertension: Secondary | ICD-10-CM | POA: Diagnosis not present

## 2021-10-07 DIAGNOSIS — G4700 Insomnia, unspecified: Secondary | ICD-10-CM | POA: Diagnosis not present

## 2021-10-07 DIAGNOSIS — F1120 Opioid dependence, uncomplicated: Secondary | ICD-10-CM | POA: Diagnosis not present

## 2021-10-07 DIAGNOSIS — F419 Anxiety disorder, unspecified: Secondary | ICD-10-CM | POA: Diagnosis not present

## 2021-10-08 DIAGNOSIS — F1120 Opioid dependence, uncomplicated: Secondary | ICD-10-CM | POA: Diagnosis not present

## 2021-10-09 DIAGNOSIS — F1120 Opioid dependence, uncomplicated: Secondary | ICD-10-CM | POA: Diagnosis not present

## 2021-10-10 DIAGNOSIS — F1120 Opioid dependence, uncomplicated: Secondary | ICD-10-CM | POA: Diagnosis not present

## 2021-10-12 DIAGNOSIS — F1120 Opioid dependence, uncomplicated: Secondary | ICD-10-CM | POA: Diagnosis not present

## 2021-10-13 DIAGNOSIS — F1120 Opioid dependence, uncomplicated: Secondary | ICD-10-CM | POA: Diagnosis not present

## 2021-10-14 DIAGNOSIS — F1120 Opioid dependence, uncomplicated: Secondary | ICD-10-CM | POA: Diagnosis not present

## 2021-10-15 DIAGNOSIS — F1120 Opioid dependence, uncomplicated: Secondary | ICD-10-CM | POA: Diagnosis not present

## 2021-10-16 DIAGNOSIS — F1120 Opioid dependence, uncomplicated: Secondary | ICD-10-CM | POA: Diagnosis not present

## 2021-10-17 DIAGNOSIS — F1120 Opioid dependence, uncomplicated: Secondary | ICD-10-CM | POA: Diagnosis not present

## 2021-10-19 DIAGNOSIS — F1120 Opioid dependence, uncomplicated: Secondary | ICD-10-CM | POA: Diagnosis not present

## 2021-10-19 DIAGNOSIS — F411 Generalized anxiety disorder: Secondary | ICD-10-CM | POA: Diagnosis not present

## 2021-10-20 DIAGNOSIS — F1120 Opioid dependence, uncomplicated: Secondary | ICD-10-CM | POA: Diagnosis not present

## 2021-10-20 DIAGNOSIS — F411 Generalized anxiety disorder: Secondary | ICD-10-CM | POA: Diagnosis not present

## 2021-10-21 DIAGNOSIS — F1120 Opioid dependence, uncomplicated: Secondary | ICD-10-CM | POA: Diagnosis not present

## 2021-10-21 DIAGNOSIS — F411 Generalized anxiety disorder: Secondary | ICD-10-CM | POA: Diagnosis not present

## 2021-10-22 DIAGNOSIS — F411 Generalized anxiety disorder: Secondary | ICD-10-CM | POA: Diagnosis not present

## 2021-10-22 DIAGNOSIS — F1120 Opioid dependence, uncomplicated: Secondary | ICD-10-CM | POA: Diagnosis not present

## 2021-10-23 DIAGNOSIS — F1120 Opioid dependence, uncomplicated: Secondary | ICD-10-CM | POA: Diagnosis not present

## 2021-10-23 DIAGNOSIS — F411 Generalized anxiety disorder: Secondary | ICD-10-CM | POA: Diagnosis not present

## 2021-10-24 DIAGNOSIS — F411 Generalized anxiety disorder: Secondary | ICD-10-CM | POA: Diagnosis not present

## 2021-10-24 DIAGNOSIS — F1120 Opioid dependence, uncomplicated: Secondary | ICD-10-CM | POA: Diagnosis not present

## 2021-10-26 DIAGNOSIS — F411 Generalized anxiety disorder: Secondary | ICD-10-CM | POA: Diagnosis not present

## 2021-10-26 DIAGNOSIS — F1120 Opioid dependence, uncomplicated: Secondary | ICD-10-CM | POA: Diagnosis not present

## 2021-10-27 DIAGNOSIS — F411 Generalized anxiety disorder: Secondary | ICD-10-CM | POA: Diagnosis not present

## 2021-10-27 DIAGNOSIS — F1120 Opioid dependence, uncomplicated: Secondary | ICD-10-CM | POA: Diagnosis not present

## 2021-10-28 DIAGNOSIS — F411 Generalized anxiety disorder: Secondary | ICD-10-CM | POA: Diagnosis not present

## 2021-10-28 DIAGNOSIS — F1120 Opioid dependence, uncomplicated: Secondary | ICD-10-CM | POA: Diagnosis not present

## 2021-10-29 DIAGNOSIS — F1120 Opioid dependence, uncomplicated: Secondary | ICD-10-CM | POA: Diagnosis not present

## 2021-10-30 DIAGNOSIS — F1120 Opioid dependence, uncomplicated: Secondary | ICD-10-CM | POA: Diagnosis not present

## 2021-11-03 DIAGNOSIS — F1120 Opioid dependence, uncomplicated: Secondary | ICD-10-CM | POA: Diagnosis not present

## 2021-11-04 DIAGNOSIS — F1120 Opioid dependence, uncomplicated: Secondary | ICD-10-CM | POA: Diagnosis not present

## 2021-11-04 DIAGNOSIS — F332 Major depressive disorder, recurrent severe without psychotic features: Secondary | ICD-10-CM | POA: Diagnosis not present

## 2021-11-04 DIAGNOSIS — F411 Generalized anxiety disorder: Secondary | ICD-10-CM | POA: Diagnosis not present

## 2021-11-05 DIAGNOSIS — F1120 Opioid dependence, uncomplicated: Secondary | ICD-10-CM | POA: Diagnosis not present

## 2021-11-05 DIAGNOSIS — F332 Major depressive disorder, recurrent severe without psychotic features: Secondary | ICD-10-CM | POA: Diagnosis not present

## 2021-11-05 DIAGNOSIS — F411 Generalized anxiety disorder: Secondary | ICD-10-CM | POA: Diagnosis not present

## 2021-11-06 DIAGNOSIS — F1120 Opioid dependence, uncomplicated: Secondary | ICD-10-CM | POA: Diagnosis not present

## 2021-11-09 DIAGNOSIS — F1120 Opioid dependence, uncomplicated: Secondary | ICD-10-CM | POA: Diagnosis not present

## 2021-11-10 DIAGNOSIS — F411 Generalized anxiety disorder: Secondary | ICD-10-CM | POA: Diagnosis not present

## 2021-11-10 DIAGNOSIS — F332 Major depressive disorder, recurrent severe without psychotic features: Secondary | ICD-10-CM | POA: Diagnosis not present

## 2021-11-10 DIAGNOSIS — F1120 Opioid dependence, uncomplicated: Secondary | ICD-10-CM | POA: Diagnosis not present

## 2021-11-11 DIAGNOSIS — F1120 Opioid dependence, uncomplicated: Secondary | ICD-10-CM | POA: Diagnosis not present

## 2021-11-12 DIAGNOSIS — F1120 Opioid dependence, uncomplicated: Secondary | ICD-10-CM | POA: Diagnosis not present

## 2021-11-12 DIAGNOSIS — F411 Generalized anxiety disorder: Secondary | ICD-10-CM | POA: Diagnosis not present

## 2021-11-12 DIAGNOSIS — F332 Major depressive disorder, recurrent severe without psychotic features: Secondary | ICD-10-CM | POA: Diagnosis not present

## 2021-11-13 DIAGNOSIS — F1120 Opioid dependence, uncomplicated: Secondary | ICD-10-CM | POA: Diagnosis not present

## 2021-11-16 DIAGNOSIS — F411 Generalized anxiety disorder: Secondary | ICD-10-CM | POA: Diagnosis not present

## 2021-11-16 DIAGNOSIS — F332 Major depressive disorder, recurrent severe without psychotic features: Secondary | ICD-10-CM | POA: Diagnosis not present

## 2021-11-16 DIAGNOSIS — F1120 Opioid dependence, uncomplicated: Secondary | ICD-10-CM | POA: Diagnosis not present

## 2021-11-17 DIAGNOSIS — F1120 Opioid dependence, uncomplicated: Secondary | ICD-10-CM | POA: Diagnosis not present

## 2021-11-18 DIAGNOSIS — F411 Generalized anxiety disorder: Secondary | ICD-10-CM | POA: Diagnosis not present

## 2021-11-18 DIAGNOSIS — F332 Major depressive disorder, recurrent severe without psychotic features: Secondary | ICD-10-CM | POA: Diagnosis not present

## 2021-11-18 DIAGNOSIS — F1120 Opioid dependence, uncomplicated: Secondary | ICD-10-CM | POA: Diagnosis not present

## 2021-11-19 DIAGNOSIS — F1120 Opioid dependence, uncomplicated: Secondary | ICD-10-CM | POA: Diagnosis not present

## 2021-11-20 DIAGNOSIS — F1120 Opioid dependence, uncomplicated: Secondary | ICD-10-CM | POA: Diagnosis not present

## 2021-11-23 DIAGNOSIS — F1120 Opioid dependence, uncomplicated: Secondary | ICD-10-CM | POA: Diagnosis not present

## 2021-11-23 DIAGNOSIS — F332 Major depressive disorder, recurrent severe without psychotic features: Secondary | ICD-10-CM | POA: Diagnosis not present

## 2021-11-23 DIAGNOSIS — F411 Generalized anxiety disorder: Secondary | ICD-10-CM | POA: Diagnosis not present

## 2021-11-25 DIAGNOSIS — F1120 Opioid dependence, uncomplicated: Secondary | ICD-10-CM | POA: Diagnosis not present

## 2021-11-26 DIAGNOSIS — F1120 Opioid dependence, uncomplicated: Secondary | ICD-10-CM | POA: Diagnosis not present

## 2021-11-27 DIAGNOSIS — F1120 Opioid dependence, uncomplicated: Secondary | ICD-10-CM | POA: Diagnosis not present

## 2021-11-30 DIAGNOSIS — F1120 Opioid dependence, uncomplicated: Secondary | ICD-10-CM | POA: Diagnosis not present

## 2021-11-30 DIAGNOSIS — F411 Generalized anxiety disorder: Secondary | ICD-10-CM | POA: Diagnosis not present

## 2021-11-30 DIAGNOSIS — F332 Major depressive disorder, recurrent severe without psychotic features: Secondary | ICD-10-CM | POA: Diagnosis not present

## 2021-12-01 DIAGNOSIS — F1120 Opioid dependence, uncomplicated: Secondary | ICD-10-CM | POA: Diagnosis not present

## 2021-12-03 DIAGNOSIS — F1120 Opioid dependence, uncomplicated: Secondary | ICD-10-CM | POA: Diagnosis not present

## 2021-12-04 DIAGNOSIS — F1120 Opioid dependence, uncomplicated: Secondary | ICD-10-CM | POA: Diagnosis not present

## 2021-12-07 DIAGNOSIS — F411 Generalized anxiety disorder: Secondary | ICD-10-CM | POA: Diagnosis not present

## 2021-12-07 DIAGNOSIS — F332 Major depressive disorder, recurrent severe without psychotic features: Secondary | ICD-10-CM | POA: Diagnosis not present

## 2021-12-07 DIAGNOSIS — M25571 Pain in right ankle and joints of right foot: Secondary | ICD-10-CM | POA: Diagnosis not present

## 2021-12-07 DIAGNOSIS — F1120 Opioid dependence, uncomplicated: Secondary | ICD-10-CM | POA: Diagnosis not present

## 2021-12-08 DIAGNOSIS — F1120 Opioid dependence, uncomplicated: Secondary | ICD-10-CM | POA: Diagnosis not present

## 2021-12-09 DIAGNOSIS — F1120 Opioid dependence, uncomplicated: Secondary | ICD-10-CM | POA: Diagnosis not present

## 2021-12-11 DIAGNOSIS — F1120 Opioid dependence, uncomplicated: Secondary | ICD-10-CM | POA: Diagnosis not present

## 2021-12-14 DIAGNOSIS — F1120 Opioid dependence, uncomplicated: Secondary | ICD-10-CM | POA: Diagnosis not present

## 2021-12-14 DIAGNOSIS — F332 Major depressive disorder, recurrent severe without psychotic features: Secondary | ICD-10-CM | POA: Diagnosis not present

## 2021-12-14 DIAGNOSIS — F411 Generalized anxiety disorder: Secondary | ICD-10-CM | POA: Diagnosis not present

## 2021-12-15 DIAGNOSIS — F1120 Opioid dependence, uncomplicated: Secondary | ICD-10-CM | POA: Diagnosis not present

## 2021-12-17 DIAGNOSIS — F1120 Opioid dependence, uncomplicated: Secondary | ICD-10-CM | POA: Diagnosis not present

## 2021-12-20 DIAGNOSIS — L03115 Cellulitis of right lower limb: Secondary | ICD-10-CM | POA: Diagnosis not present

## 2021-12-20 DIAGNOSIS — F17210 Nicotine dependence, cigarettes, uncomplicated: Secondary | ICD-10-CM | POA: Diagnosis not present

## 2021-12-20 DIAGNOSIS — I10 Essential (primary) hypertension: Secondary | ICD-10-CM | POA: Diagnosis not present

## 2021-12-20 DIAGNOSIS — R9431 Abnormal electrocardiogram [ECG] [EKG]: Secondary | ICD-10-CM | POA: Diagnosis not present

## 2021-12-20 DIAGNOSIS — M79604 Pain in right leg: Secondary | ICD-10-CM | POA: Diagnosis not present

## 2021-12-20 DIAGNOSIS — L03116 Cellulitis of left lower limb: Secondary | ICD-10-CM | POA: Diagnosis not present

## 2021-12-20 DIAGNOSIS — I160 Hypertensive urgency: Secondary | ICD-10-CM | POA: Diagnosis not present

## 2021-12-20 DIAGNOSIS — M79605 Pain in left leg: Secondary | ICD-10-CM | POA: Diagnosis not present

## 2021-12-20 DIAGNOSIS — R0602 Shortness of breath: Secondary | ICD-10-CM | POA: Diagnosis not present

## 2021-12-20 DIAGNOSIS — R609 Edema, unspecified: Secondary | ICD-10-CM | POA: Diagnosis not present

## 2021-12-21 DIAGNOSIS — F411 Generalized anxiety disorder: Secondary | ICD-10-CM | POA: Diagnosis not present

## 2021-12-21 DIAGNOSIS — F332 Major depressive disorder, recurrent severe without psychotic features: Secondary | ICD-10-CM | POA: Diagnosis not present

## 2021-12-21 DIAGNOSIS — F1120 Opioid dependence, uncomplicated: Secondary | ICD-10-CM | POA: Diagnosis not present

## 2021-12-22 DIAGNOSIS — F1120 Opioid dependence, uncomplicated: Secondary | ICD-10-CM | POA: Diagnosis not present

## 2021-12-24 DIAGNOSIS — F1120 Opioid dependence, uncomplicated: Secondary | ICD-10-CM | POA: Diagnosis not present

## 2021-12-25 DIAGNOSIS — F1120 Opioid dependence, uncomplicated: Secondary | ICD-10-CM | POA: Diagnosis not present

## 2021-12-28 DIAGNOSIS — F332 Major depressive disorder, recurrent severe without psychotic features: Secondary | ICD-10-CM | POA: Diagnosis not present

## 2021-12-28 DIAGNOSIS — F1120 Opioid dependence, uncomplicated: Secondary | ICD-10-CM | POA: Diagnosis not present

## 2021-12-28 DIAGNOSIS — F411 Generalized anxiety disorder: Secondary | ICD-10-CM | POA: Diagnosis not present

## 2021-12-30 DIAGNOSIS — F1120 Opioid dependence, uncomplicated: Secondary | ICD-10-CM | POA: Diagnosis not present

## 2022-01-01 DIAGNOSIS — F1120 Opioid dependence, uncomplicated: Secondary | ICD-10-CM | POA: Diagnosis not present

## 2022-01-04 DIAGNOSIS — F332 Major depressive disorder, recurrent severe without psychotic features: Secondary | ICD-10-CM | POA: Diagnosis not present

## 2022-01-04 DIAGNOSIS — F411 Generalized anxiety disorder: Secondary | ICD-10-CM | POA: Diagnosis not present

## 2022-01-04 DIAGNOSIS — F1120 Opioid dependence, uncomplicated: Secondary | ICD-10-CM | POA: Diagnosis not present

## 2022-01-06 DIAGNOSIS — F1120 Opioid dependence, uncomplicated: Secondary | ICD-10-CM | POA: Diagnosis not present

## 2022-01-16 DIAGNOSIS — F1120 Opioid dependence, uncomplicated: Secondary | ICD-10-CM | POA: Diagnosis not present

## 2022-01-16 DIAGNOSIS — F1020 Alcohol dependence, uncomplicated: Secondary | ICD-10-CM | POA: Diagnosis not present

## 2022-01-18 DIAGNOSIS — F1120 Opioid dependence, uncomplicated: Secondary | ICD-10-CM | POA: Diagnosis not present

## 2022-01-18 DIAGNOSIS — F1020 Alcohol dependence, uncomplicated: Secondary | ICD-10-CM | POA: Diagnosis not present

## 2022-01-19 DIAGNOSIS — F1020 Alcohol dependence, uncomplicated: Secondary | ICD-10-CM | POA: Diagnosis not present

## 2022-01-19 DIAGNOSIS — F1120 Opioid dependence, uncomplicated: Secondary | ICD-10-CM | POA: Diagnosis not present

## 2022-01-20 DIAGNOSIS — F1020 Alcohol dependence, uncomplicated: Secondary | ICD-10-CM | POA: Diagnosis not present

## 2022-01-20 DIAGNOSIS — F1120 Opioid dependence, uncomplicated: Secondary | ICD-10-CM | POA: Diagnosis not present

## 2022-01-21 DIAGNOSIS — F1120 Opioid dependence, uncomplicated: Secondary | ICD-10-CM | POA: Diagnosis not present

## 2022-01-21 DIAGNOSIS — F1020 Alcohol dependence, uncomplicated: Secondary | ICD-10-CM | POA: Diagnosis not present

## 2022-01-22 DIAGNOSIS — F1120 Opioid dependence, uncomplicated: Secondary | ICD-10-CM | POA: Diagnosis not present

## 2022-01-22 DIAGNOSIS — F1020 Alcohol dependence, uncomplicated: Secondary | ICD-10-CM | POA: Diagnosis not present

## 2022-01-23 DIAGNOSIS — F1020 Alcohol dependence, uncomplicated: Secondary | ICD-10-CM | POA: Diagnosis not present

## 2022-01-23 DIAGNOSIS — F1120 Opioid dependence, uncomplicated: Secondary | ICD-10-CM | POA: Diagnosis not present

## 2022-01-25 DIAGNOSIS — F1020 Alcohol dependence, uncomplicated: Secondary | ICD-10-CM | POA: Diagnosis not present

## 2022-01-25 DIAGNOSIS — F1120 Opioid dependence, uncomplicated: Secondary | ICD-10-CM | POA: Diagnosis not present

## 2022-01-26 DIAGNOSIS — F1120 Opioid dependence, uncomplicated: Secondary | ICD-10-CM | POA: Diagnosis not present

## 2022-01-26 DIAGNOSIS — F1020 Alcohol dependence, uncomplicated: Secondary | ICD-10-CM | POA: Diagnosis not present

## 2022-01-27 DIAGNOSIS — F1120 Opioid dependence, uncomplicated: Secondary | ICD-10-CM | POA: Diagnosis not present

## 2022-01-27 DIAGNOSIS — F1020 Alcohol dependence, uncomplicated: Secondary | ICD-10-CM | POA: Diagnosis not present

## 2022-01-28 DIAGNOSIS — F1020 Alcohol dependence, uncomplicated: Secondary | ICD-10-CM | POA: Diagnosis not present

## 2022-01-28 DIAGNOSIS — F1120 Opioid dependence, uncomplicated: Secondary | ICD-10-CM | POA: Diagnosis not present

## 2022-01-29 DIAGNOSIS — F1020 Alcohol dependence, uncomplicated: Secondary | ICD-10-CM | POA: Diagnosis not present

## 2022-01-29 DIAGNOSIS — F1120 Opioid dependence, uncomplicated: Secondary | ICD-10-CM | POA: Diagnosis not present

## 2022-01-30 DIAGNOSIS — F1020 Alcohol dependence, uncomplicated: Secondary | ICD-10-CM | POA: Diagnosis not present

## 2022-01-30 DIAGNOSIS — F1120 Opioid dependence, uncomplicated: Secondary | ICD-10-CM | POA: Diagnosis not present

## 2022-04-13 ENCOUNTER — Encounter: Payer: Self-pay | Admitting: *Deleted

## 2022-10-25 ENCOUNTER — Encounter (HOSPITAL_COMMUNITY): Payer: Self-pay

## 2022-10-25 ENCOUNTER — Ambulatory Visit (HOSPITAL_COMMUNITY)
Admission: RE | Admit: 2022-10-25 | Discharge: 2022-10-25 | Disposition: A | Discharge status: Home / Self Care | Payer: Self-pay | Source: Ambulatory Visit | Attending: Family Medicine | Admitting: Family Medicine

## 2022-10-25 VITALS — BP 195/126 | HR 65 | Temp 98.7°F | Resp 17

## 2022-10-25 DIAGNOSIS — Z202 Contact with and (suspected) exposure to infections with a predominantly sexual mode of transmission: Secondary | ICD-10-CM | POA: Insufficient documentation

## 2022-10-25 DIAGNOSIS — I10 Essential (primary) hypertension: Secondary | ICD-10-CM | POA: Insufficient documentation

## 2022-10-25 MED ORDER — METRONIDAZOLE 500 MG PO TABS
2000.0000 mg | ORAL_TABLET | Freq: Once | ORAL | Status: AC
  Administered 2022-10-25: 2000 mg via ORAL

## 2022-10-25 MED ORDER — LOSARTAN POTASSIUM-HCTZ 50-12.5 MG PO TABS: 1.0000 | ORAL_TABLET | Freq: Every day | ORAL | 2 refills | Status: DC

## 2022-10-25 MED ORDER — METRONIDAZOLE 500 MG PO TABS
ORAL_TABLET | ORAL | Status: AC
  Filled 2022-10-25: qty 4

## 2022-10-25 NOTE — ED Triage Notes (Addendum)
Pt reports that sexual partner tested positive for Trich and needs treatment/testing.  Pt reports that he has some irration with urination.   Pt adds that he doesn't have a PCP at this time and needs a refill on his HTN medication. Has been out for over a month.

## 2022-10-25 NOTE — Discharge Instructions (Addendum)
Your blood pressure was noted to be elevated during your visit today. If you are currently taking medication for high blood pressure, please ensure you are taking this as directed. If you do not have a history of high blood pressure and your blood pressure remains persistently elevated, you may need to begin taking a medication at some point. You may return here within the next few days to recheck if unable to see your primary care provider or if you do not have a one.  BP (!) 195/126 (BP Location: Left Arm) Comment: pt out of HTN meds  Pulse 65   Temp 98.7 F (37.1 C) (Oral)   Resp 17   SpO2 97%   BP Readings from Last 3 Encounters:  10/25/22 (!) 195/126  09/08/21 (!) 189/118  09/08/21 (!) 204/124   We have sent testing for sexually transmitted infections. We will notify you of any positive results once they are received. If required, we will prescribe any medications you might need.  Please refrain from all sexual activity for at least the next seven days.

## 2022-10-26 LAB — CYTOLOGY, (ORAL, ANAL, URETHRAL) ANCILLARY ONLY
Chlamydia: NEGATIVE
Comment: NEGATIVE
Comment: NEGATIVE
Comment: NORMAL
Neisseria Gonorrhea: NEGATIVE
Trichomonas: NEGATIVE

## 2022-10-27 NOTE — ED Provider Notes (Signed)
[facility]   [id number] 10/25/22 Arrival Time: 3614  ASSESSMENT & PLAN:  1. Exposure to STD   2. Primary hypertension   3. Uncontrolled hypertension    Meds ordered this encounter  Medications   metroNIDAZOLE (FLAGYL) tablet 2,000 mg   losartan-hydrochlorothiazide (HYZAAR) 50-12.5 MG tablet    Sig: Take 1 tablet by mouth daily.    Dispense:  30 tablet    Refill:  2      Discharge Instructions      Your blood pressure was noted to be elevated during your visit today. If you are currently taking medication for high blood pressure, please ensure you are taking this as directed. If you do not have a history of high blood pressure and your blood pressure remains persistently elevated, you may need to begin taking a medication at some point. You may return here within the next few days to recheck if unable to see your primary care provider or if you do not have a one.  BP (!) 195/126 (BP Location: Left Arm) Comment: pt out of HTN meds  Pulse 65   Temp 98.7 F (37.1 C) (Oral)   Resp 17   SpO2 97%   BP Readings from Last 3 Encounters:  10/25/22 (!) 195/126  09/08/21 (!) 189/118  09/08/21 (!) 204/124   We have sent testing for sexually transmitted infections. We will notify you of any positive results once they are received. If required, we will prescribe any medications you might need.  Please refrain from all sexual activity for at least the next seven days.    No symptoms from elevated BP; appears chronically elevated and uncontrolled. Agrees to start Rx anti-HTN med this evening.  Pending: Labs Reviewed  CYTOLOGY, (ORAL, ANAL, URETHRAL) ANCILLARY ONLY   Reviewed expectations re: course of current medical issues. Questions answered. Outlined signs and symptoms indicating need for more acute intervention. Patient verbalized understanding. After Visit Summary given.   SUBJECTIVE:  Gerald Mills is a 41 y.o. male who reports trich exposure. No symptoms.  Denies: urinary frequency, dysuria, and gross hematuria. Afebrile. No abdominal or pelvic pain. No n/v. No rashes or lesions. Reports that he is sexually active with single male partner. Increased blood pressure noted today. Reports that he has been treated in the past for HTN. He reports no chest pain on exertion, no dyspnea on exertion, no swelling of ankles, no orthostatic dizziness or lightheadedness, no orthopnea or paroxysmal nocturnal dyspnea, and no palpitations.  OBJECTIVE:  Vitals:   10/25/22 1003  BP: (!) 195/126  Pulse: 65  Resp: 17  Temp: 98.7 F (37.1 C)  TempSrc: Oral  SpO2: 97%    General appearance: alert, cooperative, appears stated age and no distress Throat: lips, mucosa, and tongue normal; teeth and gums normal CV: RRR Lungs: unlabored respirations; speaks full sentences without difficulty Back: no CVA tenderness; FROM at waist Abdomen: soft, non-tender GU: deferred Skin: warm and dry Psychological: alert and cooperative; normal mood and affect.   Labs Reviewed  CYTOLOGY, (ORAL, ANAL, URETHRAL) ANCILLARY ONLY    No Known Allergies  Past Medical History:  Diagnosis Date   Hypertension    Family History  Problem Relation Age of Onset   Diabetes Mother    Hypertension Mother    Healthy Father    Hypertension Father    Social History   Socioeconomic History   Marital status: Single    Spouse name: Not on file   Number of children: Not on file  Years of education: Not on file   Highest education level: Not on file  Occupational History   Not on file  Tobacco Use   Smoking status: Every Day    Packs/day: 0.50    Years: 24.00    Total pack years: 12.00    Types: Cigarettes   Smokeless tobacco: Never  Vaping Use   Vaping Use: Never used  Substance and Sexual Activity   Alcohol use: Yes    Comment: occ   Drug use: Never   Sexual activity: Yes    Birth control/protection: None  Other Topics Concern   Not on file  Social History  Narrative   Not on file   Social Determinants of Health   Financial Resource Strain: Not on file  Food Insecurity: Not on file  Transportation Needs: Not on file  Physical Activity: Not on file  Stress: Not on file  Social Connections: Not on file  Intimate Partner Violence: Not on file           [place], MD 10/27/22 1031

## 2023-02-25 ENCOUNTER — Inpatient Hospital Stay (HOSPITAL_COMMUNITY)
Admission: EM | Admit: 2023-02-25 | Discharge: 2023-03-01 | DRG: 683 | Disposition: A | Discharge status: Home / Self Care | Payer: Self-pay | Attending: Internal Medicine | Admitting: Internal Medicine

## 2023-02-25 ENCOUNTER — Other Ambulatory Visit (HOSPITAL_COMMUNITY)
Admission: EM | Admit: 2023-02-25 | Discharge: 2023-02-25 | Disposition: A | Discharge status: Other Acute Inpatient Hospital | Payer: No Payment, Other | Attending: Psychiatry | Admitting: Psychiatry

## 2023-02-25 ENCOUNTER — Encounter (HOSPITAL_COMMUNITY): Payer: Self-pay

## 2023-02-25 DIAGNOSIS — Z1152 Encounter for screening for COVID-19: Secondary | ICD-10-CM | POA: Insufficient documentation

## 2023-02-25 DIAGNOSIS — F17210 Nicotine dependence, cigarettes, uncomplicated: Secondary | ICD-10-CM | POA: Diagnosis present

## 2023-02-25 DIAGNOSIS — Z8249 Family history of ischemic heart disease and other diseases of the circulatory system: Secondary | ICD-10-CM

## 2023-02-25 DIAGNOSIS — F1120 Opioid dependence, uncomplicated: Secondary | ICD-10-CM | POA: Diagnosis present

## 2023-02-25 DIAGNOSIS — Z79899 Other long term (current) drug therapy: Secondary | ICD-10-CM

## 2023-02-25 DIAGNOSIS — Z6831 Body mass index (BMI) 31.0-31.9, adult: Secondary | ICD-10-CM

## 2023-02-25 DIAGNOSIS — F1193 Opioid use, unspecified with withdrawal: Secondary | ICD-10-CM

## 2023-02-25 DIAGNOSIS — F1123 Opioid dependence with withdrawal: Secondary | ICD-10-CM | POA: Diagnosis present

## 2023-02-25 DIAGNOSIS — E669 Obesity, unspecified: Secondary | ICD-10-CM | POA: Diagnosis present

## 2023-02-25 DIAGNOSIS — R112 Nausea with vomiting, unspecified: Secondary | ICD-10-CM

## 2023-02-25 DIAGNOSIS — R17 Unspecified jaundice: Secondary | ICD-10-CM | POA: Diagnosis present

## 2023-02-25 DIAGNOSIS — F1190 Opioid use, unspecified, uncomplicated: Secondary | ICD-10-CM | POA: Diagnosis present

## 2023-02-25 DIAGNOSIS — N179 Acute kidney failure, unspecified: Principal | ICD-10-CM | POA: Diagnosis present

## 2023-02-25 DIAGNOSIS — Z833 Family history of diabetes mellitus: Secondary | ICD-10-CM

## 2023-02-25 DIAGNOSIS — I10 Essential (primary) hypertension: Secondary | ICD-10-CM | POA: Diagnosis present

## 2023-02-25 DIAGNOSIS — Z91199 Patient's noncompliance with other medical treatment and regimen due to unspecified reason: Secondary | ICD-10-CM

## 2023-02-25 DIAGNOSIS — R7989 Other specified abnormal findings of blood chemistry: Secondary | ICD-10-CM | POA: Diagnosis present

## 2023-02-25 DIAGNOSIS — E876 Hypokalemia: Secondary | ICD-10-CM | POA: Diagnosis present

## 2023-02-25 DIAGNOSIS — E860 Dehydration: Secondary | ICD-10-CM | POA: Diagnosis present

## 2023-02-25 DIAGNOSIS — Z635 Disruption of family by separation and divorce: Secondary | ICD-10-CM

## 2023-02-25 DIAGNOSIS — R001 Bradycardia, unspecified: Secondary | ICD-10-CM

## 2023-02-25 DIAGNOSIS — M6282 Rhabdomyolysis: Secondary | ICD-10-CM | POA: Diagnosis present

## 2023-02-25 DIAGNOSIS — F4321 Adjustment disorder with depressed mood: Secondary | ICD-10-CM | POA: Diagnosis present

## 2023-02-25 DIAGNOSIS — F17200 Nicotine dependence, unspecified, uncomplicated: Secondary | ICD-10-CM | POA: Diagnosis present

## 2023-02-25 DIAGNOSIS — G8929 Other chronic pain: Secondary | ICD-10-CM | POA: Diagnosis present

## 2023-02-25 DIAGNOSIS — Z202 Contact with and (suspected) exposure to infections with a predominantly sexual mode of transmission: Principal | ICD-10-CM | POA: Diagnosis present

## 2023-02-25 LAB — CBC WITH DIFFERENTIAL/PLATELET
Abs Immature Granulocytes: 0.02 10*3/uL (ref 0.00–0.07)
Abs Immature Granulocytes: 0.03 10*3/uL (ref 0.00–0.07)
Basophils Absolute: 0 10*3/uL (ref 0.0–0.1)
Basophils Absolute: 0 10*3/uL (ref 0.0–0.1)
Basophils Relative: 0 %
Basophils Relative: 0 %
Eosinophils Absolute: 0 10*3/uL (ref 0.0–0.5)
Eosinophils Absolute: 0 10*3/uL (ref 0.0–0.5)
Eosinophils Relative: 0 %
Eosinophils Relative: 0 %
HCT: 40.4 % (ref 39.0–52.0)
HCT: 42.8 % (ref 39.0–52.0)
Hemoglobin: 13 g/dL (ref 13.0–17.0)
Hemoglobin: 13.6 g/dL (ref 13.0–17.0)
Immature Granulocytes: 0 %
Immature Granulocytes: 0 %
Lymphocytes Relative: 20 %
Lymphocytes Relative: 23 %
Lymphs Abs: 1.7 10*3/uL (ref 0.7–4.0)
Lymphs Abs: 2 10*3/uL (ref 0.7–4.0)
MCH: 29.6 pg (ref 26.0–34.0)
MCH: 29.3 pg (ref 26.0–34.0)
MCHC: 32.2 g/dL (ref 30.0–36.0)
MCHC: 31.8 g/dL (ref 30.0–36.0)
MCV: 92 fL (ref 80.0–100.0)
MCV: 92.2 fL (ref 80.0–100.0)
Monocytes Absolute: 0.9 10*3/uL (ref 0.1–1.0)
Monocytes Absolute: 0.8 10*3/uL (ref 0.1–1.0)
Monocytes Relative: 10 %
Monocytes Relative: 9 %
Neutro Abs: 6.1 10*3/uL (ref 1.7–7.7)
Neutro Abs: 5.9 10*3/uL (ref 1.7–7.7)
Neutrophils Relative %: 70 %
Neutrophils Relative %: 68 %
Platelets: 195 10*3/uL (ref 150–400)
Platelets: 215 10*3/uL (ref 150–400)
RBC: 4.39 MIL/uL (ref 4.22–5.81)
RBC: 4.64 MIL/uL (ref 4.22–5.81)
RDW: 14.6 % (ref 11.5–15.5)
RDW: 14.6 % (ref 11.5–15.5)
WBC: 8.7 10*3/uL (ref 4.0–10.5)
WBC: 8.7 10*3/uL (ref 4.0–10.5)
nRBC: 0 % (ref 0.0–0.2)
nRBC: 0 % (ref 0.0–0.2)

## 2023-02-25 LAB — COMPREHENSIVE METABOLIC PANEL
ALT: 32 U/L (ref 0–44)
ALT: 36 U/L (ref 0–44)
AST: 59 U/L — ABNORMAL HIGH (ref 15–41)
AST: 60 U/L — ABNORMAL HIGH (ref 15–41)
Albumin: 4.8 g/dL (ref 3.5–5.0)
Albumin: 4.8 g/dL (ref 3.5–5.0)
Alkaline Phosphatase: 76 U/L (ref 38–126)
Alkaline Phosphatase: 76 U/L (ref 38–126)
Anion gap: 13 (ref 5–15)
Anion gap: 12 (ref 5–15)
BUN: 35 mg/dL — ABNORMAL HIGH (ref 6–20)
BUN: 34 mg/dL — ABNORMAL HIGH (ref 6–20)
CO2: 28 mmol/L (ref 22–32)
CO2: 27 mmol/L (ref 22–32)
Calcium: 9.3 mg/dL (ref 8.9–10.3)
Calcium: 9.5 mg/dL (ref 8.9–10.3)
Chloride: 97 mmol/L — ABNORMAL LOW (ref 98–111)
Chloride: 96 mmol/L — ABNORMAL LOW (ref 98–111)
Creatinine, Ser: 2.67 mg/dL — ABNORMAL HIGH (ref 0.61–1.24)
Creatinine, Ser: 2.43 mg/dL — ABNORMAL HIGH (ref 0.61–1.24)
GFR, Estimated: 30 mL/min — ABNORMAL LOW (ref >60)
GFR, Estimated: 33 mL/min — ABNORMAL LOW (ref >60)
Glucose, Bld: 136 mg/dL — ABNORMAL HIGH (ref 70–99)
Glucose, Bld: 113 mg/dL — ABNORMAL HIGH (ref 70–99)
Potassium: 3.5 mmol/L (ref 3.5–5.1)
Potassium: 3.1 mmol/L — ABNORMAL LOW (ref 3.5–5.1)
Sodium: 138 mmol/L (ref 135–145)
Sodium: 135 mmol/L (ref 135–145)
Total Bilirubin: 1.9 mg/dL — ABNORMAL HIGH (ref 0.3–1.2)
Total Bilirubin: 1.7 mg/dL — ABNORMAL HIGH (ref 0.3–1.2)
Total Protein: 7.2 g/dL (ref 6.5–8.1)
Total Protein: 7.8 g/dL (ref 6.5–8.1)

## 2023-02-25 LAB — MAGNESIUM
Magnesium: 2 mg/dL (ref 1.7–2.4)
Magnesium: 2 mg/dL (ref 1.7–2.4)

## 2023-02-25 LAB — TROPONIN I (HIGH SENSITIVITY): Troponin I (High Sensitivity): 20 ng/L — ABNORMAL HIGH (ref <18)

## 2023-02-25 LAB — HIV ANTIBODY (ROUTINE TESTING W REFLEX): HIV Screen 4th Generation wRfx: NONREACTIVE

## 2023-02-25 LAB — ETHANOL: Alcohol, Ethyl (B): <10 mg/dL (ref <10)

## 2023-02-25 LAB — TSH: TSH: 0.332 u[IU]/mL — ABNORMAL LOW (ref 0.350–4.500)

## 2023-02-25 LAB — SARS CORONAVIRUS 2 BY RT PCR: SARS Coronavirus 2 by RT PCR: NEGATIVE

## 2023-02-25 MED ORDER — DICYCLOMINE HCL 20 MG PO TABS: 20.0000 mg | ORAL_TABLET | Freq: Four times a day (QID) | ORAL | Status: DC | PRN

## 2023-02-25 MED ORDER — HYDROXYZINE HCL 25 MG PO TABS: 25.0000 mg | ORAL_TABLET | Freq: Three times a day (TID) | ORAL | Status: DC | PRN

## 2023-02-25 MED ORDER — MAGNESIUM HYDROXIDE 400 MG/5ML PO SUSP: 30.0000 mL | Freq: Every day | ORAL | Status: DC | PRN

## 2023-02-25 MED ORDER — LOSARTAN POTASSIUM 50 MG PO TABS: 50.0000 mg | ORAL_TABLET | Freq: Every day | ORAL | Status: DC

## 2023-02-25 MED ORDER — ONDANSETRON 4 MG PO TBDP: 4.0000 mg | ORAL_TABLET | Freq: Four times a day (QID) | ORAL | Status: DC | PRN

## 2023-02-25 MED ORDER — CLONIDINE HCL 0.1 MG PO TABS: 0.1000 mg | ORAL_TABLET | ORAL | Status: DC

## 2023-02-25 MED ORDER — TRAZODONE HCL 50 MG PO TABS: 50.0000 mg | ORAL_TABLET | Freq: Every evening | ORAL | Status: DC | PRN

## 2023-02-25 MED ORDER — LOPERAMIDE HCL 2 MG PO CAPS: 2.0000 mg | ORAL_CAPSULE | ORAL | Status: DC | PRN

## 2023-02-25 MED ORDER — CLONIDINE HCL 0.1 MG PO TABS: 0.1000 mg | ORAL_TABLET | Freq: Every day | ORAL | Status: DC

## 2023-02-25 MED ORDER — CLONIDINE HCL 0.1 MG PO TABS: 0.1000 mg | ORAL_TABLET | Freq: Four times a day (QID) | ORAL | Status: DC

## 2023-02-25 MED ORDER — ACETAMINOPHEN 325 MG PO TABS: 650.0000 mg | ORAL_TABLET | Freq: Four times a day (QID) | ORAL | Status: DC | PRN

## 2023-02-25 MED ORDER — ALUM & MAG HYDROXIDE-SIMETH 200-200-20 MG/5ML PO SUSP: 30.0000 mL | ORAL | Status: DC | PRN

## 2023-02-25 MED ORDER — METHOCARBAMOL 500 MG PO TABS: 500.0000 mg | ORAL_TABLET | Freq: Three times a day (TID) | ORAL | Status: DC | PRN

## 2023-02-25 MED ORDER — NAPROXEN 500 MG PO TABS: 500.0000 mg | ORAL_TABLET | Freq: Two times a day (BID) | ORAL | Status: DC | PRN

## 2023-02-25 MED ORDER — HYDROCHLOROTHIAZIDE 12.5 MG PO TABS: 12.5000 mg | ORAL_TABLET | Freq: Every day | ORAL | Status: DC

## 2023-02-25 NOTE — BH Assessment (Signed)
Comprehensive Clinical Assessment (CCA) Note  02/25/2023 ROBSON TRICKEY [id number] DISPOSITION: Freida Busman NP recommends patient to [facility] although patient is being transferred to [facility] to be medically cleared.  The patient demonstrates the following risk factors for suicide: Chronic risk factors for suicide include: N/A. Acute risk factors for suicide include: N/A. Protective factors for this patient include: coping skills. Considering these factors, the overall suicide risk at this point appears to be low. Patient is not appropriate for outpatient follow up.   Patient is a 42 year old male that presents this date requesting assistance with ongoing SA issues. Patient denies any S/I, H/I or AVH. Patient denies any prior mental health diagnosis or current OP services associated with SA or mental health symptoms. Patient reports he attempted to enter into an inpatient program at [facility] in 2022 but did not have the ability to pay over two thousand dollars upfront to enter treatment. Patient per notes was also seen at [facility] on 09/09/21 when he was requesting assistance at that time also for ongoing SA issues although did not meet inpatient criteria at that time and was discharged with resources.   Patient reports daily use over the last year stating he uses various amounts of opiates and heroin. Patient reports on the average he uses anywhere from one tenth of a gram of heroin to one half of a gram. Patient denies any IV drug use. Patient reports he "snorts" the heroin or at times "smokes it." Patient also states that when he can't find heroin he will use opiates by mouth stating he will use various amounts of Vicodin, Percocet and Roxicets, "when he can find it." Patient reports last use in the last 24 hours when he states he used, "a little heroin." Patient denies the use of any other substances. Patient denies access to firearms. Patient reports he does have upcoming charges associated with a "civil matter"  that he states, "is not criminal." Patient is vague in reference to what those charges are.        Freida Busman NP evaluated patient this date and writes: Patient presents voluntarily to [facility] behavioral health for walk-in assessment.   Patient is assessed, face-to-face, by nurse practitioner. He is seated in assessment area, no acute distress. Consulted with provider, Dr.  Lucianne Muss, and chart reviewed on 02/25/2023. He  is alert and oriented, pleasant and cooperative during assessment.    Antjuan seeking opioid detox today.  He reports use of heroin, intermittently x 4 months.  Most recent use of heroin on yesterday when he ingested approximately $30 worth of heroin.  He also uses opioid pain medications.  Most recent use of pain medications 2 days ago.  Patient endorses history of opioid use disorder.  He reports 13 months sobriety that ended in January 2024.  Patient attended residential substance use treatment program lasting 13 months that ended in January 2024.  He denies alcohol use, he denies substance use aside from opioid pain medications and heroin.  Patient uses nicotine daily.   Patient is committed to his sobriety and recovery.  Patient states "I have got to do this I just got a new job and I need this job."   Patient denies mental health history.  No current medications to address mood.  He denies history of inpatient psychiatric hospitalization.  No family mental health or addiction history reported.   Patient  presents with euthymic mood, congruent affect. He  denies suicidal and homicidal ideations. Denies history of suicide attempts, denies  history of self-harm.  Patient easily  contracts verbally for safety with this Clinical research associate.    Patient has normal speech and behavior.  He  denies auditory and visual hallucinations.  Patient is able to converse coherently with goal-directed thoughts and no distractibility or preoccupation.  Denies symptoms of paranoia.  Objectively there is no evidence  of psychosis/mania or delusional thinking.   Mister resides with his mother in [place].  He denies access to weapons.  He is currently employed.  Patient endorses average appetite.   Patient offered support and encouragement.  Reviewed treatment plan to include admission to facility based crisis unit.  Discussed medications including clonidine and reviewed potential side effects, patient offered opportunity to ask questions. Janathan states "my girlfriend says she has trichomonas and I would like to be tested and treated."   Chief Complaint: No chief complaint on file.  Visit Diagnosis: Opioid use disorder    CCA Screening, Triage and Referral (STR)  Patient Reported Information How did you hear about [place]? Self  What Is the Reason for Your Visit/Call Today? Patient is a 42 year old male that presents this date requesting assistance with ongoing opiate use. Patient denies any S/I, H/I or AVH. Patient states he has been using herion and various opiates by mouth "whenever he can find them," daily since he relapsed in January of 2024. Patient denies any current withdrawals with last use in the last 24 hours when patient reported he used "a little herion." Patient denies any other SA use. Patient denies any IV drug use.  How Long Has This Been Causing You Problems? > than 6 months  What Do You Feel Would Help You the Most Today? Alcohol or Drug Use Treatment   Have You Recently Had Any Thoughts About Hurting Yourself? No  Are You Planning to Commit Suicide/Harm Yourself At This time? No   Flowsheet Row ED from 02/25/2023 in [facility] ED from 09/08/2021 in [facility] Emergency Department at [facility] ED from 08/07/2021 in [facility] RISK CATEGORY No Risk No Risk No Risk       Have you Recently Had Thoughts About Hurting Someone Karolee Ohs? No  Are You Planning to Harm Someone at This Time? No  Explanation:  NA   Have You Used Any Alcohol or Drugs in the Past 24 Hours? Yes  What Did You Use and How Much? Patient states he "used a little herion" in the last 24 hours.   Do You Currently Have a Therapist/Psychiatrist? No  Name of Therapist/Psychiatrist: Name of Therapist/Psychiatrist: NA   Have You Been Recently Discharged From Any Office Practice or Programs? No  Explanation of Discharge From Practice/Program: NA     CCA Screening Triage Referral Assessment Type of Contact: Face-to-Face  Telemedicine Service Delivery:   Is this Initial or Reassessment?   Date Telepsych consult ordered in [facility]:    Time Telepsych consult ordered in [facility]:    Location of Assessment: [facility] Assessment Services  Provider Location: [facility] Assessment Services   Collateral Involvement: None at this time   Does Patient Have a Automotive engineer Guardian? No  Legal Guardian Contact Information: NA  Copy of Legal Guardianship Form: -- (NA)  Legal Guardian Notified of Arrival: -- (NA)  Legal Guardian Notified of Pending Discharge: -- (NA)  If Minor and Not Living with Parent(s), Who has Custody? NA  Is CPS involved or ever been involved? Never  Is APS  involved or ever been involved? Never   Patient Determined To Be At Risk for Harm To Self or Others Based on Review of Patient Reported Information or Presenting Complaint? No  Method: No Plan  Availability of Means: No access or NA  Intent: Vague intent or NA  Notification Required: No need or identified person  Additional Information for Danger to Others Potential: -- (NA)  Additional Comments for Danger to Others Potential: NA  Are There Guns or Other Weapons in Your Home? No  Types of Guns/Weapons: NA  Are These Weapons Safely Secured?                            -- (NA)  Who Could Verify You Are Able To Have These Secured: NA  Do You Have any Outstanding Charges, Pending Court Dates, Parole/Probation? Denies  Contacted To  Inform of Risk of Harm To Self or Others: Other: Comment (NA)    Does Patient Present under Involuntary Commitment? No data recorded   [place] of Residence: [place]   Patient Currently Receiving the Following Services: Not Receiving Services   Determination of Need: Urgent (48 hours)   Options For Referral: Facility-Based Crisis     CCA Biopsychosocial Patient Reported Schizophrenia/Schizoaffective Diagnosis in Past: No   Strengths: Patient is willing to participate in treatment and open to treatment interventions   Mental Health Symptoms Depression:   Difficulty Concentrating; Hopelessness   Duration of Depressive symptoms:    Mania:   None   Anxiety:    Difficulty concentrating; Irritability; Restlessness   Psychosis:   None   Duration of Psychotic symptoms:    Trauma:   None   Obsessions:   None   Compulsions:   None   Inattention:   None   Hyperactivity/Impulsivity:   None   Oppositional/Defiant Behaviors:   None   Emotional Irregularity:   None   Other Mood/Personality Symptoms:   NA    Mental Status Exam Appearance and self-care  Stature:   Average   Weight:   Average weight   Clothing:   Casual   Grooming:   Normal   Cosmetic use:   None   Posture/gait:   Normal   Motor activity:   Not Remarkable   Sensorium  Attention:   Normal   Concentration:   Normal   Orientation:   X5   Recall/memory:   Normal   Affect and Mood  Affect:   Appropriate   Mood:   Anxious   Relating  Eye contact:   Normal   Facial expression:   Anxious   Attitude toward examiner:   Cooperative   Thought and Language  Speech flow:  Normal   Thought content:   Appropriate to Mood and Circumstances   Preoccupation:   None   Hallucinations:   None   Organization:   Goal-directed; Intact   [employer] of Knowledge:   Fair   Intelligence:   Average   Abstraction:   Normal   Judgement:    Fair   Programmer, systems   Insight:   Fair   Decision Making:   Normal   Social Functioning  Social Maturity:   Responsible   Social Judgement:   Normal   Stress  Stressors:   Other (Comment) (Ongoing SA issues)   Coping Ability:   Overwhelmed   Skill Deficits:   Activities of daily living   Supports:   Support  needed     Religion: Religion/Spirituality Are You A Religious Person?: No How Might This Affect Treatment?: NA  Leisure/Recreation: Leisure / Recreation Do You Have Hobbies?: No  Exercise/Diet: Exercise/Diet Do You Exercise?: No Have You Gained or Lost A Significant Amount of Weight in the Past Six Months?: No Do You Follow a Special Diet?: No Do You Have Any Trouble Sleeping?: No   CCA Employment/Education Employment/Work Situation: Employment / Work Situation Employment Situation: Unemployed Patient's Job has Been Impacted by Current Illness: No Has Patient ever Been in Equities trader?: No  Education: Education Is Patient Currently Attending School?: No Last Grade Completed: 12 Did You Product manager?: No Did You Have An Individualized Education Program (IIEP): No Did You Have Any Difficulty At [employer]?: No Patient's Education Has Been Impacted by Current Illness: No   CCA Family/Childhood History Family and Relationship History: Family history Marital status: Separated Separated, when?: 2 years seperated What types of issues is patient dealing with in the relationship?: Patient states when they were together they were "fighting over everything." Additional relationship information: NA Does patient have children?: Yes How many children?: 4 How is patient's relationship with their children?: Patient reports a positive relationship  Childhood History:  Childhood History By whom was/is the patient raised?: Both parents Did patient suffer any verbal/emotional/physical/sexual abuse as a child?: No Did patient suffer from  severe childhood neglect?: No Has patient ever been sexually abused/assaulted/raped as an adolescent or adult?: No Was the patient ever a victim of a crime or a disaster?: No Witnessed domestic violence?: No Has patient been affected by domestic violence as an adult?: No       CCA Substance Use Alcohol/Drug Use: Alcohol / Drug Use Pain Medications: See MAR Prescriptions: See MAR Over the Counter: See MAR History of alcohol / drug use?: Yes Longest period of sobriety (when/how long): Pt states over one year back in 2022 Negative Consequences of Use: Personal relationships, Financial Withdrawal Symptoms: None Substance #1 Name of Substance 1: Herion 1 - Age of First Use: 30 1 - Amount (size/oz): Pt states amounts vary from one tenth of a gram of herion to one half gram 1 - Frequency: Patient states daily for the last year 1 - Duration: Ongoing 1 - Last Use / Amount: Patient states in the last 24 hours he used "a little herion" 1 - Method of Aquiring: Illegally 1- Route of Use: Snorting Substance #2 Name of Substance 2: Opiates (Vicodin, Roxies, Percocets) 2 - Age of First Use: 30 2 - Amount (size/oz): Varies on what drugs he is able to obtain 2 - Frequency: Pt reports whenever he can not find herion 2 - Duration: Ongoing 2 - Last Use / Amount: Patient states in the last "2 or 3 days" 2 - Method of Aquiring: Illegally 2 - Route of Substance Use: Oral                     ASAM's:  Six Dimensions of Multidimensional Assessment  Dimension 1:  Acute Intoxication and/or Withdrawal Potential:   Dimension 1:  Description of individual's past and current experiences of substance use and withdrawal: Patient denies any withdrawals  Dimension 2:  Biomedical Conditions and Complications:   Dimension 2:  Description of patient's biomedical conditions and  complications: Adequate ability to cope with pain  Dimension 3:  Emotional, Behavioral, or Cognitive Conditions and  Complications:  Dimension 3:  Description of emotional, behavioral, or cognitive conditions and complications: Not immediate threat to  safety  Dimension 4:  Readiness to Change:  Dimension 4:  Description of Readiness to Change criteria: Willing to enter treatment and explore tretament interventions  Dimension 5:  Relapse, Continued use, or Continued Problem Potential:  Dimension 5:  Relapse, continued use, or continued problem potential critiera description: Poor skills to deal with relapse  Dimension 6:  Recovery/Living Environment:  Dimension 6:  Recovery/Iiving environment criteria description: Environment is not supportive  ASAM Severity Score: ASAM's Severity Rating Score: 9  ASAM Recommended Level of Treatment: ASAM Recommended Level of Treatment: Level III Residential Treatment   Substance use Disorder (SUD) Substance Use Disorder (SUD)  Checklist Symptoms of Substance Use: Continued use despite having a persistent/recurrent physical/psychological problem caused/exacerbated by use, Continued use despite persistent or recurrent social, interpersonal problems, caused or exacerbated by use, Presence of craving or strong urge to use  Recommendations for Services/Supports/Treatments: Recommendations for Services/Supports/Treatments Recommendations For Services/Supports/Treatments: Facility Based Crisis  Discharge Disposition:    DSM5 Diagnoses: Patient Active Problem List   Diagnosis Date Noted   Opioid use disorder 02/25/2023   Opiate abuse, continuous 09/08/2021   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   Mood disorder 03/16/2021   Pain of left calf 10/08/2020   History of snoring 05/28/2020   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

## 2023-02-25 NOTE — ED Provider Triage Note (Signed)
Emergency Medicine Provider Triage Evaluation Note  KENNIEL BERGSMA , a 42 y.o. male  was evaluated in triage.  Pt complains of slow heart rate. Pt went for opiate detox today, but was noted to have high blood pressure and bradycardia, sent here for further eval.  Report he did not filled his BP medication x 2 weeks.  Otherwise denies cp, sob, lighthead/dizzy  Review of Systems  Positive: As above Negative: As above  Physical Exam  BP (!) 192/109 (BP Location: Right Arm)   Pulse (!) 56   Temp 98.6 F (37 C) (Oral)   Resp 19   Wt 121.1 kg   SpO2 100%   BMI 32.50 kg/m  Gen:   Awake, no distress   Resp:  Normal effort  MSK:   Moves extremities without difficulty  Other:    Medical Decision Making  Medically screening exam initiated at 9:20 PM.  Appropriate orders placed.  EDVIN ALBUS was informed that the remainder of the evaluation will be completed by another provider, this initial triage assessment does not replace that evaluation, and the importance of remaining in the ED until their evaluation is complete.     Fayrene Helper, PA-C 02/25/23 2121

## 2023-02-25 NOTE — ED Triage Notes (Signed)
POV/ ambulatory/ pt reports a HR of 49 @ home via apple watch/ pt denies any symptoms at this time.

## 2023-02-25 NOTE — ED Provider Notes (Signed)
Facility Based Crisis Admission H&P  Date: 02/25/23 Patient Name: Gerald Mills MRN: [id number] Chief Complaint: Opioid use disorder  Diagnoses:  Final diagnoses:  Opioid use disorder    HPI: Patient presents voluntarily to [facility] behavioral health for walk-in assessment.   Patient is assessed, face-to-face, by nurse practitioner. He is seated in assessment area, no acute distress. Consulted with provider, Dr.  Lucianne Muss, and chart reviewed on 02/25/2023. He  is alert and oriented, pleasant and cooperative during assessment.   Omero seeking opioid detox today.  He reports use of heroin, intermittently x 4 months.  Most recent use of heroin on yesterday when he ingested approximately $30 worth of heroin.  He also uses opioid pain medications.  Most recent use of pain medications 2 days ago.  Patient endorses history of opioid use disorder.  He reports 13 months sobriety that ended in January 2024.  Patient attended residential substance use treatment program lasting 13 months that ended in January 2024.  He denies alcohol use, he denies substance use aside from opioid pain medications and heroin.  Patient uses nicotine daily.  Patient is committed to his sobriety and recovery.  Patient states "I have got to do this I just got a new job and I need this job."  Patient denies mental health history.  No current medications to address mood.  He denies history of inpatient psychiatric hospitalization.  No family mental health or addiction history reported.  Patient  presents with euthymic mood, congruent affect. He  denies suicidal and homicidal ideations. Denies history of suicide attempts, denies history of self-harm.  Patient easily  contracts verbally for safety with this Clinical research associate.    Patient has normal speech and behavior.  He  denies auditory and visual hallucinations.  Patient is able to converse coherently with goal-directed thoughts and no distractibility or preoccupation.  Denies  symptoms of paranoia.  Objectively there is no evidence of psychosis/mania or delusional thinking.  Abimelec resides with his mother in [place].  He denies access to weapons.  He is currently employed.  Patient endorses average appetite.  Patient offered support and encouragement.  Reviewed treatment plan to include admission to facility based crisis unit.  Discussed medications including clonidine and reviewed potential side effects, patient offered opportunity to ask questions. Yanixan states "my girlfriend says she has trichomonas and I would like to be tested and treated."   PHQ 2-9:  Flowsheet Row Office Visit from 08/12/2021 in [facility] Office Visit from 03/16/2021 in [facility] Office Visit from 05/28/2020 in [facility]  Thoughts that you would be better off dead, or of hurting yourself in some way Not at all Not at all Not at all  PHQ-9 Total Score Flowsheet Row ED from 09/08/2021 in [facility] Department at [facility] ED from 08/07/2021 in [facility] ED from 05/11/2021 in [facility] at [facility] RISK CATEGORY No Risk No Risk No Risk        Total Time spent with patient: 30 minutes  Musculoskeletal  Strength & Muscle Tone: within normal limits Gait & Station: normal Patient leans: N/A  Psychiatric Specialty Exam  Presentation General Appearance:  Appropriate for Environment; Casual  Eye Contact: Good  Speech: Clear and Coherent; Normal Rate  Speech Volume: Normal  Handedness: Right   Mood and Affect  Mood: Euthymic  Affect:  Appropriate; Congruent   Thought Process  Thought Processes: Coherent; Goal Directed; Linear  Descriptions of Associations:Intact  Orientation:Full (Time, Place and Person)  Thought Content:Logical; WDL    Hallucinations:Hallucinations: None  Ideas of  Reference:None  Suicidal Thoughts:Suicidal Thoughts: No  Homicidal Thoughts:Homicidal Thoughts: No   Sensorium  Memory: Immediate Good; Recent Good  Judgment: Fair  Insight: Good   Executive Functions  Concentration: Good  Attention Span: Good  Recall: Good  Fund of Knowledge: Good  Language: Good   Psychomotor Activity  Psychomotor Activity: Psychomotor Activity: Normal   Assets  Assets: Communication Skills; Desire for Improvement; Financial Resources/Insurance; Housing; Leisure Time; Physical Health; Resilience; Social Support   Sleep  Sleep: Sleep: Poor   Nutritional Assessment (For OBS and FBC admissions only) Has the patient had a weight loss or gain of 10 pounds or more in the last 3 months?: No Has the patient had a decrease in food intake/or appetite?: No Does the patient have dental problems?: No Does the patient have eating habits or behaviors that may be indicators of an eating disorder including binging or inducing vomiting?: No Has the patient recently lost weight without trying?: 0 Has the patient been eating poorly because of a decreased appetite?: 0 Malnutrition Screening Tool Score: 0    Physical Exam Vitals and nursing note reviewed.  Constitutional:      Appearance: Normal appearance. He is well-developed.  HENT:     Head: Normocephalic and atraumatic.  Cardiovascular:     Rate and Rhythm: Normal rate.  Pulmonary:     Effort: Pulmonary effort is normal.  Musculoskeletal:        General: Normal range of motion.     Cervical back: Normal range of motion.  Skin:    General: Skin is warm and dry.  Neurological:     Mental Status: He is alert and oriented to person, place, and time.    ROS  Blood pressure (!) 160/102, pulse (!) 57, temperature 97.6 F (36.4 C), temperature source Oral, resp. rate 18, SpO2 100 %. There is no height or weight on file to calculate BMI.  Past Psychiatric History: Opioid use disorder, mood  disorder   Is the patient at risk to self? No  Has the patient been a risk to self in the past 6 months? No .    Has the patient been a risk to self within the distant past? No   Is the patient a risk to others? No   Has the patient been a risk to others in the past 6 months? No   Has the patient been a risk to others within the distant past? No   Past Medical History: Hypertension, chronic smoker, swelling of lower extremity, erectile dysfunction Family History: None reported Social History: Opioid use disorder, resides with family, employed  Last Labs:  Admission on 10/25/2022, Discharged on 10/25/2022  Component Date Value Ref Range Status   Neisseria Gonorrhea 10/25/2022 Negative   Final   Chlamydia 10/25/2022 Negative   Final   Trichomonas 10/25/2022 Negative   Final   Comment 10/25/2022 Normal Reference Range Trichomonas - Negative   Final   Comment 10/25/2022 Normal Reference Ranger Chlamydia - Negative   Final   Comment 10/25/2022 Normal Reference Range Neisseria Gonorrhea - Negative   Final    Allergies: Patient has no known allergies.  Medications:  Facility Ordered Medications  Medication   acetaminophen (TYLENOL) tablet 650 mg   alum & mag hydroxide-simeth (MAALOX/MYLANTA) 200-200-20 MG/5ML suspension 30 mL  magnesium hydroxide (MILK OF MAGNESIA) suspension 30 mL   hydrOXYzine (ATARAX) tablet 25 mg   traZODone (DESYREL) tablet 50 mg   dicyclomine (BENTYL) tablet 20 mg   loperamide (IMODIUM) capsule 2-4 mg   methocarbamol (ROBAXIN) tablet 500 mg   naproxen (NAPROSYN) tablet 500 mg   ondansetron (ZOFRAN-ODT) disintegrating tablet 4 mg   cloNIDine (CATAPRES) tablet 0.1 mg   Followed by   Melene Muller ON 02/28/2023] cloNIDine (CATAPRES) tablet 0.1 mg   Followed by   Melene Muller ON 03/02/2023] cloNIDine (CATAPRES) tablet 0.1 mg   PTA Medications  Medication Sig   ibuprofen (ADVIL) 800 MG tablet Take 1 tablet (800 mg total) by mouth 3 (three) times daily.   escitalopram  (LEXAPRO) 10 MG tablet Take 1 tablet (10 mg total) by mouth daily.   sildenafil (VIAGRA) 25 MG tablet Take 1 tablet (25 mg total) by mouth daily as needed for erectile dysfunction.   cetirizine (ZYRTEC ALLERGY) 10 MG tablet Take 1 tablet (10 mg total) by mouth daily.   losartan-hydrochlorothiazide (HYZAAR) 50-12.5 MG tablet Take 1 tablet by mouth daily.    Long Term Goals: Improvement in symptoms so as ready for discharge  Short Term Goals: Patient will verbalize feelings in meetings with treatment team members., Patient will attend at least of 50% of the groups daily., Pt will complete the PHQ9 on admission, day 3 and discharge., Patient will participate in completing the [place] Suicide Severity Rating Scale, Patient will score a low risk of violence for 24 hours prior to discharge, and Patient will take medications as prescribed daily.  Medical Decision Making  Patient remains voluntary.  He is excepted to [facility] behavioral health facility based crisis unit.  Laboratory studies ordered including CBC, CMP, ethanol,magesium and TSH.  Urrine drug screen ordered.  HIV antibody ordered.  GC/chlamydia probe order initiated.  EKG order initiated.   Current medications: -Acetaminophen 650 mg every 6 as needed/mild pain -Maalox 30 mL oral every 4 as needed/digestion -Hydroxyzine 25 mg 3 times daily as needed/anxiety -Magnesium hydroxide 30 mL daily as needed/mild constipation -Trazodone 50 mg nightly as needed/sleep -Nicotine NicoDerm CQ transdermal patch 14 mg daily/nicotine withdrawal  Initiated home medications including:  -Hydrochlorothiazide 12.5 mg daily -Losartan 50 mg daily  Clonidine Detox protocol initiated: -Clonidine 0.1 mg 4 daily x10 doses, clonidine 0.1 mg every morning and nightly x4 doses, clonidine 0.1 mg daily before breakfast x2 doses -Dicyclomine 20 mg every 6 hours as needed/spasms or abdominal cramping -Loperamide 2 to 4 mg oral as needed/diarrhea or loose  stools -Methocarbamol 500 mg every 8 hours as needed/muscle spasms -Naproxen 500 mg twice daily as needed/aching, pain or discomfort -Ondansetron disintegrating tablet 4mg  every 8 hours as needed/nausea or vomiting      Recommendations  Based on my evaluation the patient appears to have an emergency medical condition for which I recommend the patient be transferred to the emergency department for further evaluation.  Patient heart rate bradycardic at 44 complaint EKG.  Elevated BP measures 116/102.  Patient accepted to [facility] emergency department by Dr. Silverio Lay for medical clearance.  Patient is appropriate to return to [facility] behavioral health facility based crisis unit once medically cleared.  Patient remains voluntary.  Lenard Lance, FNP 02/25/23  4:52 PM

## 2023-02-26 ENCOUNTER — Observation Stay (HOSPITAL_COMMUNITY): Payer: Self-pay

## 2023-02-26 ENCOUNTER — Other Ambulatory Visit: Payer: Self-pay

## 2023-02-26 DIAGNOSIS — R112 Nausea with vomiting, unspecified: Secondary | ICD-10-CM

## 2023-02-26 DIAGNOSIS — E876 Hypokalemia: Secondary | ICD-10-CM

## 2023-02-26 DIAGNOSIS — F17200 Nicotine dependence, unspecified, uncomplicated: Secondary | ICD-10-CM

## 2023-02-26 DIAGNOSIS — N179 Acute kidney failure, unspecified: Secondary | ICD-10-CM

## 2023-02-26 DIAGNOSIS — F1193 Opioid use, unspecified with withdrawal: Secondary | ICD-10-CM

## 2023-02-26 LAB — COMPREHENSIVE METABOLIC PANEL
ALT: 32 U/L (ref 0–44)
AST: 42 U/L — ABNORMAL HIGH (ref 15–41)
Albumin: 3.4 g/dL — ABNORMAL LOW (ref 3.5–5.0)
Alkaline Phosphatase: 75 U/L (ref 38–126)
Anion gap: 12 (ref 5–15)
BUN: 34 mg/dL — ABNORMAL HIGH (ref 6–20)
CO2: 24 mmol/L (ref 22–32)
Calcium: 8.6 mg/dL — ABNORMAL LOW (ref 8.9–10.3)
Chloride: 100 mmol/L (ref 98–111)
Creatinine, Ser: 1.76 mg/dL — ABNORMAL HIGH (ref 0.61–1.24)
GFR, Estimated: 49 mL/min — ABNORMAL LOW (ref >60)
Glucose, Bld: 205 mg/dL — ABNORMAL HIGH (ref 70–99)
Potassium: 3.1 mmol/L — ABNORMAL LOW (ref 3.5–5.1)
Sodium: 136 mmol/L (ref 135–145)
Total Bilirubin: 1 mg/dL (ref 0.3–1.2)
Total Protein: 5.8 g/dL — ABNORMAL LOW (ref 6.5–8.1)

## 2023-02-26 LAB — RAPID URINE DRUG SCREEN, HOSP PERFORMED
Amphetamines: NOT DETECTED
Barbiturates: NOT DETECTED
Benzodiazepines: NOT DETECTED
Cocaine: NOT DETECTED
Opiates: POSITIVE — AB
Tetrahydrocannabinol: NOT DETECTED

## 2023-02-26 LAB — TROPONIN I (HIGH SENSITIVITY): Troponin I (High Sensitivity): 20 ng/L — ABNORMAL HIGH (ref <18)

## 2023-02-26 LAB — MAGNESIUM: Magnesium: 1.8 mg/dL (ref 1.7–2.4)

## 2023-02-26 LAB — CK: Total CK: 1524 U/L — ABNORMAL HIGH (ref 49–397)

## 2023-02-26 LAB — T4, FREE: Free T4: 1.06 ng/dL (ref 0.61–1.12)

## 2023-02-26 LAB — LIPASE, BLOOD: Lipase: 31 U/L (ref 11–51)

## 2023-02-26 MED ORDER — ACETAMINOPHEN 325 MG PO TABS
650.0000 mg | ORAL_TABLET | Freq: Four times a day (QID) | ORAL | Status: DC | PRN
  Administered 2023-02-26 – 2023-02-28 (×4): 650 mg via ORAL
  Filled 2023-02-26 (×4): qty 2

## 2023-02-26 MED ORDER — AMLODIPINE BESYLATE 10 MG PO TABS
10.0000 mg | ORAL_TABLET | Freq: Every day | ORAL | Status: DC
  Administered 2023-02-27 – 2023-03-01 (×3): 10 mg via ORAL
  Filled 2023-02-26 (×3): qty 1

## 2023-02-26 MED ORDER — LACTATED RINGERS IV BOLUS
1000.0000 mL | Freq: Once | INTRAVENOUS | Status: AC
  Administered 2023-02-26: 1000 mL via INTRAVENOUS

## 2023-02-26 MED ORDER — NICOTINE 14 MG/24HR TD PT24
14.0000 mg | MEDICATED_PATCH | Freq: Every day | TRANSDERMAL | Status: DC
  Administered 2023-02-26: 14 mg via TRANSDERMAL
  Filled 2023-02-26 (×3): qty 1

## 2023-02-26 MED ORDER — METRONIDAZOLE 500 MG PO TABS
500.0000 mg | ORAL_TABLET | Freq: Once | ORAL | Status: AC
  Administered 2023-02-26: 500 mg via ORAL
  Filled 2023-02-26: qty 1

## 2023-02-26 MED ORDER — HYDRALAZINE HCL 25 MG PO TABS
25.0000 mg | ORAL_TABLET | Freq: Four times a day (QID) | ORAL | Status: DC | PRN
  Administered 2023-02-26 – 2023-02-27 (×2): 25 mg via ORAL
  Filled 2023-02-26 (×2): qty 1

## 2023-02-26 MED ORDER — HYDRALAZINE HCL 20 MG/ML IJ SOLN
5.0000 mg | INTRAMUSCULAR | Status: AC
  Administered 2023-02-27: 5 mg via INTRAVENOUS
  Filled 2023-02-26: qty 1

## 2023-02-26 MED ORDER — DICLOFENAC SODIUM 1 % EX GEL
2.0000 g | Freq: Four times a day (QID) | CUTANEOUS | Status: DC
  Administered 2023-02-26 – 2023-03-01 (×9): 2 g via TOPICAL
  Filled 2023-02-26 (×2): qty 100

## 2023-02-26 MED ORDER — HYDROCHLOROTHIAZIDE 12.5 MG PO TABS: 12.5000 mg | ORAL_TABLET | Freq: Every day | ORAL | Status: DC

## 2023-02-26 MED ORDER — POTASSIUM CHLORIDE IN NACL 20-0.9 MEQ/L-% IV SOLN
INTRAVENOUS | Status: DC
  Filled 2023-02-26 (×2): qty 1000

## 2023-02-26 MED ORDER — ONDANSETRON HCL 4 MG/2ML IJ SOLN
4.0000 mg | Freq: Four times a day (QID) | INTRAMUSCULAR | Status: DC | PRN
  Administered 2023-02-28: 4 mg via INTRAVENOUS
  Filled 2023-02-26: qty 2

## 2023-02-26 MED ORDER — ACETAMINOPHEN 650 MG RE SUPP: 650.0000 mg | Freq: Four times a day (QID) | RECTAL | Status: DC | PRN

## 2023-02-26 MED ORDER — ESCITALOPRAM OXALATE 10 MG PO TABS
10.0000 mg | ORAL_TABLET | Freq: Every day | ORAL | Status: DC
  Administered 2023-02-26 – 2023-03-01 (×4): 10 mg via ORAL
  Filled 2023-02-26 (×4): qty 1

## 2023-02-26 MED ORDER — HEPARIN SODIUM (PORCINE) 5000 UNIT/ML IJ SOLN
5000.0000 [IU] | Freq: Three times a day (TID) | INTRAMUSCULAR | Status: DC
  Administered 2023-02-26: 5000 [IU] via SUBCUTANEOUS
  Filled 2023-02-26 (×5): qty 1

## 2023-02-26 MED ORDER — AMLODIPINE BESYLATE 5 MG PO TABS
5.0000 mg | ORAL_TABLET | Freq: Every day | ORAL | Status: DC
  Administered 2023-02-26: 5 mg via ORAL
  Filled 2023-02-26: qty 1

## 2023-02-26 MED ORDER — POTASSIUM CHLORIDE CRYS ER 20 MEQ PO TBCR: 40.0000 meq | EXTENDED_RELEASE_TABLET | Freq: Once | ORAL | Status: DC

## 2023-02-26 MED ORDER — LOSARTAN POTASSIUM 50 MG PO TABS: 50.0000 mg | ORAL_TABLET | Freq: Every day | ORAL | Status: DC

## 2023-02-26 MED ORDER — LOSARTAN POTASSIUM-HCTZ 50-12.5 MG PO TABS: 1.0000 | ORAL_TABLET | Freq: Every day | ORAL | Status: DC

## 2023-02-26 NOTE — H&P (Signed)
PCP:   Pcp, No   Chief Complaint:  bradycardia  HPI: This is a 42y/o male w/ PMH of HTN. He was sent in from detox. He has been detoxing from pain meds for the last 2 days. He's had two days of ongoing nausea, vomiting, and feeling dehydrated.  Today he was bradycardic, prior to medication being dispensed.  The facility was not comfortable giving him medication with his bradycardia.  He was sent the ER.  The patient's heart rate was in the high 40s at the facility.  Patient denies being lightheaded, dizzy or having chest pains.  In the ER patient's heart rate has ranged between 50-60.  His initial EKG showed a heart rate of 43, which quickly resolved.  Patient asymptomatic.  Patient's creatinine was 2.67, baseline creatinine 0.99 [09/08/2021].  Admission requested.  Review of Systems:  The patient denies anorexia, fever,  vision loss, decreased hearing, hoarseness, chest pain, syncope, dyspnea on exertion, peripheral edema, balance deficits, hemoptysis, abdominal pain, melena, hematochezia, severe indigestion/heartburn, hematuria, incontinence, genital sores, muscle weakness, suspicious skin lesions, transient blindness, difficulty walking, depression, unusual weight change, abnormal bleeding, enlarged lymph nodes, angioedema, and breast masses. Positive: Nausea, vomiting, lethargy, weight loss  Past Medical History: Past Medical History:  Diagnosis Date   Hypertension    History reviewed. No pertinent surgical history.  Medications: Prior to Admission medications   Medication Sig Start Date End Date Taking? Authorizing Provider  acetaminophen (TYLENOL) 500 MG tablet Take 500 mg by mouth every 6 (six) hours as needed for moderate pain.   Yes [provider]  cetirizine (ZYRTEC ALLERGY) 10 MG tablet Take 1 tablet (10 mg total) by mouth daily. Patient not taking: Reported on 02/25/2023 05/11/21   Wallis Bamberg, PA-C  escitalopram (LEXAPRO) 10 MG tablet Take 1 tablet (10 mg total) by  mouth daily. Patient not taking: Reported on 02/25/2023 03/16/21   Mirian Mo, MD  ibuprofen (ADVIL) 800 MG tablet Take 1 tablet (800 mg total) by mouth 3 (three) times daily. Patient not taking: Reported on 02/25/2023 07/25/20   Wieters, Junius Creamer, PA-C  losartan-hydrochlorothiazide (HYZAAR) 50-12.5 MG tablet Take 1 tablet by mouth daily. Patient not taking: Reported on 02/25/2023 10/25/22   Mardella Layman, MD  sildenafil (VIAGRA) 25 MG tablet Take 1 tablet (25 mg total) by mouth daily as needed for erectile dysfunction. Patient not taking: Reported on 02/25/2023 03/16/21   Mirian Mo, MD  furosemide (LASIX) 20 MG tablet Take 1 tablet (20 mg total) by mouth daily for 7 days. 09/25/20 03/03/21  Bing Neighbors, NP  hydrochlorothiazide (HYDRODIURIL) 25 MG tablet Take 1 tablet (25 mg total) by mouth daily. 04/04/17 08/25/19  Coralyn Mark, NP  lisinopril-hydrochlorothiazide (ZESTORETIC) 10-12.5 MG tablet Take 1 tablet by mouth daily. 05/21/19 08/25/19  Mickie Bail, NP    Allergies:  No Known Allergies  Social History:  reports that he has been smoking cigarettes. He has a 12.00 pack-year smoking history. He has never used smokeless tobacco. He reports current alcohol use. He reports that he does not use drugs.  Family History: Family History  Problem Relation Age of Onset   Diabetes Mother    Hypertension Mother    Healthy Father    Hypertension Father     Physical Exam: Vitals:   02/25/23 2222 02/26/23 0200 02/26/23 0215 02/26/23 0245  BP: (!) 164/94 (!) 153/85 (!) 157/89 (!) 144/99  Pulse: (!) 58 69 (!) 58 (!) 55  Resp: Temp: 97.7  F (36.5 C)     TempSrc: Oral     SpO2: 97% 97% 100% 94%  Weight:        General:  Alert and oriented times three, well developed and nourished, no acute distress Eyes: PERRLA, pink conjunctiva, no scleral icterus ENT: Moist oral mucosa, neck supple, no thyromegaly Lungs: clear to ascultation, no wheeze, no crackles, no use of  accessory muscles Cardiovascular: regular rate and rhythm, no regurgitation, no gallops, no murmurs. No carotid bruits, no JVD Abdomen: soft, positive BS, non-tender, non-distended, no organomegaly, not an acute abdomen GU: not examined Neuro: CN II - XII grossly intact, sensation intact Musculoskeletal: strength 5/5 all extremities, no clubbing, cyanosis or edema Skin: no rash, no subcutaneous crepitation, no decubitus Psych: appropriate patient   Labs on Admission:  Recent Labs    02/25/23 1735 02/25/23 2128  NA 138 135  K 3.5 3.1*  CL 97* 96*  CO2 28 27  GLUCOSE 136* 113*  BUN 35* 34*  CREATININE 2.67* 2.43*  CALCIUM 9.3 9.5  MG 2.0 2.0   Recent Labs    02/25/23 1735 02/25/23 2128  AST 59* 60*  ALT 32 36  ALKPHOS 76 76  BILITOT 1.9* 1.7*  PROT 7.2 7.8  ALBUMIN 4.8 4.8   No results for input(s): "LIPASE", "AMYLASE" in the last 72 hours. Recent Labs    02/25/23 1735 02/25/23 2128  WBC 8.7 8.7  NEUTROABS 6.1 5.9  HGB 13.0 13.6  HCT 40.4 42.8  MCV 92.0 92.2  PLT 195 215    Recent Labs    02/25/23 1645  TSH 0.332*     Micro Results: Recent Results (from the past 240 hour(s))  SARS Coronavirus 2 by RT PCR (hospital order, performed in [facility] hospital lab) *cepheid single result test* Anterior Nasal Swab     Status: None   Collection Time: 02/25/23  5:35 PM   Specimen: Anterior Nasal Swab  Result Value Ref Range Status   SARS Coronavirus 2 by RT PCR NEGATIVE NEGATIVE Final    Comment: Performed at [facility], [address] [place], [place] [address]     Radiological Exams on Admission: No results found.  Assessment/Plan Present on Admission:  AKI -Secondary to nausea and vomiting -IV fluid hydration -BMP in a.m.   Hypokalemia -Repleting IV, -Check magnesium level   Nausea vomiting -Antiemetics as needed   Bradycardia -Resolved, current heart rate 57   Hypertension, controlled -Norvasc 5 mg p.o. daily initiated, first  dose   Detox from opioids -Per program   Current smoker -Nicotine patch ordered  Ashleyann Shoun 02/26/2023, 3:45 AM

## 2023-02-26 NOTE — Progress Notes (Signed)
PROGRESS NOTE  Gerald Mills ZOX:[id number] DOB: 04/01/1981   PCP: Pcp, No  Patient is from: Home  DOA: 02/25/2023 LOS: 0  Chief complaints Chief Complaint  Patient presents with   Bradycardia     Brief Narrative / Interim history: 42 year old M with PMH of HTN, mood disorder, osteoarthritis, noncompliance, tobacco use disorder and opiate use disorder sent to ED from detox due to bradycardia to 40s, and admitted for AKI, intractable nausea and vomiting and sinus bradycardia.  Patient had nausea, vomiting and dehydration for about 2 days.  He has been detoxing from pain meds for 2 days.  In ED, BP elevated to 192/109.  HR 56. Cr 10.67 (0.99 in 09/2021).  BUN 34.  K3.1.  T. bili 1.7.  First EKG EKG sinus bradycardia to 43.  Subsequent EKG NSR.  Troponin 20 x 2.  Patient was started on IV fluid and potassium supplementation and admitted.    Subjective: Seen and examined earlier this morning.  No major event this morning.  Reports nausea but no emesis.  Denies abdominal pain.  Reports bilateral knee pain (chronic).  Feels tired.  Not able to quantify how much oxycodone he uses.   Objective: Vitals:   02/26/23 0430 02/26/23 0451 02/26/23 0730 02/26/23 1001  BP: (!) 162/96  137/83   Pulse: (!) 58  (!) 53   Resp: 18  17   Temp:  (!) 97.4 F (36.3 C)  98.7 F (37.1 C)  TempSrc:  Oral  Oral  SpO2: 97%  97%   Weight:        Examination:  GENERAL: No apparent distress.  Nontoxic. HEENT: MMM.  Vision and hearing grossly intact.  NECK: Supple.  No apparent JVD.  RESP:  No IWOB.  Fair aeration bilaterally. CVS:  RRR. Heart sounds normal.  ABD/GI/GU: BS+. Abd soft, NTND.  MSK/EXT:  Moves extremities. No apparent deformity. No edema.  SKIN: no apparent skin lesion or wound NEURO: Awake, alert and oriented appropriately.  No apparent focal neuro deficit. PSYCH: Calm. Normal affect.   Procedures:  None  Microbiology summarized: None  Assessment and plan: Principal Problem:    AKI (acute kidney injury) Active Problems:   Current smoker   Nausea and vomiting   Hypokalemia  Sinus bradycardia: Could be due to electrolyte arrangement.  He was hypokalemic to 3.1.  He is not on nodal blocking agent.  Unsure if he was started on clonidine for his opiate detox.  He does not remember.  TSH slightly low at 0.33.  Bradycardia seems to have resolved. -Optimize electrolytes -Avoid nodal blocking agents -Check free T4  Intractable nausea and vomiting: Opiate withdrawal?  Continues to endorse nausea but tolerated soft diet this morning.  Improving. -Continue IV fluid -Antiemetics as needed -Correct electrolytes.  AKI: Cr 2.67 on admit (0.99 in 2022).  Multifactorial including GI loss/dehydration and possible NSAID use.  No longer takes ARB/HCTZ. Recent Labs    02/25/23 1735 02/25/23 2128  BUN 35* 34*  CREATININE 2.67* 2.43*  -Continue IV fluid hydration -Recheck renal function -Further workup if no improvement  Essential hypertension: Normotensive this morning. -Continue monitoring -Continue low-dose amlodipine  Bilateral knee pain/osteoarthritis: X-ray with degenerative changes. -Voltaren gel 4 times daily -Tylenol as needed   Hypokalemia: -Replenish and recheck.  Low TSH: Patient denies taking Synthroid.  No history of thyroid disease. -Check free T4 -Recheck TSH in 4 to 6 weeks  Hyperbilirubinemia: Mild. -Recheck   Opiate use disorder: Reports using oxycodone.  Not able to  quantify.  Started detox 2 days prior to presentation.  Seems stable now.  -TOC consulted -Would avoid clonidine in the setting of bradycardia  Mood disorder: Stable. -Continue home meds.  Tobacco use disorder: -Encourage smoking cessation. -Nicotine patch ordered  PCP need: -TOC consulted.  Obesity Body mass index is 32.5 kg/m.           DVT prophylaxis:  heparin injection 5,000 Units Start: 02/26/23 0600  Code Status: Full code Family Communication: None at  bedside Level of care: Telemetry Medical Status is: Observation The patient will require care spanning > 2 midnights and should be moved to inpatient because: AKI, intractable nausea and vomiting   Final disposition: Home Consultants:  None  85 minutes with more than 50% spent in reviewing records, counseling patient/family and coordinating care.   Sch Meds:  Scheduled Meds:  amLODipine  5 mg Oral Daily   escitalopram  10 mg Oral Daily   heparin  5,000 Units Subcutaneous Q8H   nicotine  14 mg Transdermal Daily   Continuous Infusions:  0.9 % NaCl with KCl 20 mEq / L 100 mL/hr at 02/26/23 0553   PRN Meds:.acetaminophen **OR** acetaminophen, ondansetron (ZOFRAN) IV  Antimicrobials: Anti-infectives (From admission, onward)    Start     Dose/Rate Route Frequency Ordered Stop   02/26/23 0130  metroNIDAZOLE (FLAGYL) tablet 500 mg        500 mg Oral  Once 02/26/23 0122 02/26/23 0239        I have personally reviewed the following labs and images: CBC: Recent Labs  Lab 02/25/23 1735 02/25/23 2128  WBC 8.7 8.7  NEUTROABS 6.1 5.9  HGB 13.0 13.6  HCT 40.4 42.8  MCV 92.0 92.2  PLT 195 215   BMP &GFR Recent Labs  Lab 02/25/23 1735 02/25/23 2128  NA 138 135  K 3.5 3.1*  CL 97* 96*  CO2 28 27  GLUCOSE 136* 113*  BUN 35* 34*  CREATININE 2.67* 2.43*  CALCIUM 9.3 9.5  MG 2.0 2.0   CrCl cannot be calculated (Unknown ideal weight.). Liver & Pancreas: Recent Labs  Lab 02/25/23 1735 02/25/23 2128  AST 59* 60*  ALT 32 36  ALKPHOS 76 76  BILITOT 1.9* 1.7*  PROT 7.2 7.8  ALBUMIN 4.8 4.8   No results for input(s): "LIPASE", "AMYLASE" in the last 168 hours. No results for input(s): "AMMONIA" in the last 168 hours. Diabetic: No results for input(s): "HGBA1C" in the last 72 hours. No results for input(s): "GLUCAP" in the last 168 hours. Cardiac Enzymes: No results for input(s): "CKTOTAL", "CKMB", "CKMBINDEX", "TROPONINI" in the last 168 hours. No results for  input(s): "PROBNP" in the last 8760 hours. Coagulation Profile: No results for input(s): "INR", "PROTIME" in the last 168 hours. Thyroid Function Tests: Recent Labs    02/25/23 1645  TSH 0.332*   Lipid Profile: No results for input(s): "CHOL", "HDL", "LDLCALC", "TRIG", "CHOLHDL", "LDLDIRECT" in the last 72 hours. Anemia Panel: No results for input(s): "VITAMINB12", "FOLATE", "FERRITIN", "TIBC", "IRON", "RETICCTPCT" in the last 72 hours. Urine analysis:    Component Value Date/Time   LABSPEC 1.025 04/04/2017 1948   PHURINE 5.5 04/04/2017 1948   GLUCOSEU NEGATIVE 04/04/2017 1948   HGBUR NEGATIVE 04/04/2017 1948   BILIRUBINUR NEGATIVE 04/04/2017 1948   KETONESUR NEGATIVE 04/04/2017 1948   PROTEINUR 100 (A) 04/04/2017 1948   UROBILINOGEN 0.2 04/04/2017 1948   NITRITE NEGATIVE 04/04/2017 1948   LEUKOCYTESUR NEGATIVE 04/04/2017 1948   Sepsis Labs: Invalid input(s): "PROCALCITONIN", "LACTICIDVEN"  Microbiology: Recent Results (from the past 240 hour(s))  SARS Coronavirus 2 by RT PCR (hospital order, performed in [facility] hospital lab) *cepheid single result test* Anterior Nasal Swab     Status: None   Collection Time: 02/25/23  5:35 PM   Specimen: Anterior Nasal Swab  Result Value Ref Range Status   SARS Coronavirus 2 by RT PCR NEGATIVE NEGATIVE Final    Comment: Performed at [facility], [address], [place], [place] [address]    Radiology Studies: DG Knee 1-2 Views Right  Result Date: 02/26/2023 CLINICAL DATA:  42 year old male with history of bilateral knee pain. EXAM: RIGHT KNEE - 1-2 VIEW COMPARISON:  No priors. FINDINGS: Two views of the right knee demonstrate no acute displaced fracture, subluxation or dislocation. There is multifocal joint space narrowing and subchondral sclerosis, most evident in the medial and patellofemoral compartments, indicative of mild osteoarthritis. IMPRESSION: 1. No acute radiographic abnormality of the right knee. 2. Mild  osteoarthritis, as above. Electronically Signed   By: Trudie Reed M.D.   On: 02/26/2023 06:30   DG Knee 1-2 Views Left  Result Date: 02/26/2023 CLINICAL DATA:  42 year old male with history of bilateral knee pain. EXAM: LEFT KNEE - 1-2 VIEW COMPARISON:  No priors. FINDINGS: Two views of the left knee demonstrate no acute displaced fracture, subluxation or dislocation. There is joint space narrowing, subchondral sclerosis and osteophyte formation noted, most evident in the medial compartment. IMPRESSION: 1. No acute radiographic abnormality of the left knee. 2. 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 Electronically Signed   By: Trudie Reed M.D.   On: 02/26/2023 06:29      Jaydn Moscato T. Arieal [facility]  If 7PM-7AM, please contact night-coverage [email or web address] 02/26/2023, 10:18 AM

## 2023-02-26 NOTE — ED Notes (Signed)
ED TO INPATIENT HANDOFF REPORT  ED Nurse Name and Phone #: Delice Bison, RN  S Name/Age/Gender Gerald Mills 42 y.o. male Room/Bed: 039C/039C  Code Status   Code Status: Full Code  Home/SNF/Other Home Patient oriented to: self, place, time, and situation Is this baseline? Yes   Triage Complete: Triage complete  Chief Complaint AKI (acute kidney injury) [N17.9]  Triage Note POV/ ambulatory/ pt reports a HR of 49 @ home via apple watch/ pt denies any symptoms at this time.    Allergies No Known Allergies  Level of Care/Admitting Diagnosis ED Disposition     ED Disposition  Admit   Condition  --   Comment  Hospital Area: [facility] [[id number]]  Level of Care: Telemetry Medical [104]  May place patient in observation at [facility] or [facility] if equivalent level of care is available:: Yes  Covid Evaluation: Confirmed COVID Negative  Diagnosis: AKI (acute kidney injury) [562130]  Admitting Physician: Gery Pray [[id number]  Attending Physician: Alvester Chou          B Medical/Surgery History Past Medical History:  Diagnosis Date   Hypertension    History reviewed. No pertinent surgical history.   A IV Location/Drains/Wounds Patient Lines/Drains/Airways Status     Active Line/Drains/Airways     Name Placement date Placement time Site Days   Peripheral IV 02/26/23 20 G Left Antecubital 02/26/23  0151  Antecubital  less than 1            Intake/Output Last 24 hours No intake or output data in the 24 hours ending 02/26/23 1402  Labs/Imaging Results for orders placed or performed during the hospital encounter of 02/25/23 (from the past 48 hour(s))  Comprehensive metabolic panel     Status: Abnormal   Collection Time: 02/25/23  9:28 PM  Result Value Ref Range   Sodium 135 135 - 145 mmol/L   Potassium 3.1 (L) 3.5 - 5.1 mmol/L   Chloride 96 (L) 98 - 111 mmol/L   CO2 27 22 - 32 mmol/L   Glucose, Bld 113 (H) 70 - 99 mg/dL     Comment: Glucose reference range applies only to samples taken after fasting for at least 8 hours.   BUN 34 (H) 6 - 20 mg/dL   Creatinine, Ser 8.65 (H) 0.61 - 1.24 mg/dL   Calcium 9.5 8.9 - 78.4 mg/dL   Total Protein 7.8 6.5 - 8.1 g/dL   Albumin 4.8 3.5 - 5.0 g/dL   AST 60 (H) 15 - 41 U/L   ALT 36 0 - 44 U/L   Alkaline Phosphatase 76 38 - 126 U/L   Total Bilirubin 1.7 (H) 0.3 - 1.2 mg/dL   GFR, Estimated 33 (L) >60 mL/min    Comment: (NOTE) Calculated using the CKD-EPI Creatinine Equation (2021)    Anion gap 12 5 - 15    Comment: Performed at [facility] Lab, [address] [place], [place] [address]  CBC with Differential     Status: None   Collection Time: 02/25/23  9:28 PM  Result Value Ref Range   WBC 8.7 4.0 - 10.5 K/uL   RBC 4.64 4.22 - 5.81 MIL/uL   Hemoglobin 13.6 13.0 - 17.0 g/dL   HCT 52.8 41.3 - 24.4 %   MCV 92.2 80.0 - 100.0 fL   MCH 29.3 26.0 - 34.0 pg   MCHC 31.8 30.0 - 36.0 g/dL   RDW 01.0 27.2 - 53.6 %   Platelets 215 150 -  400 K/uL   nRBC 0.0 0.0 - 0.2 %   Neutrophils Relative % 68 %   Neutro Abs 5.9 1.7 - 7.7 K/uL   Lymphocytes Relative 23 %   Lymphs Abs 2.0 0.7 - 4.0 K/uL   Monocytes Relative 9 %   Monocytes Absolute 0.8 0.1 - 1.0 K/uL   Eosinophils Relative 0 %   Eosinophils Absolute 0.0 0.0 - 0.5 K/uL   Basophils Relative 0 %   Basophils Absolute 0.0 0.0 - 0.1 K/uL   Immature Granulocytes 0 %   Abs Immature Granulocytes 0.03 0.00 - 0.07 K/uL    Comment: Performed at [facility] Lab, [address] [place], [place] [address]  Magnesium     Status: None   Collection Time: 02/25/23  9:28 PM  Result Value Ref Range   Magnesium 2.0 1.7 - 2.4 mg/dL    Comment: Performed at [facility], [address] [place], [place] [address]  Troponin I (High Sensitivity)     Status: Abnormal   Collection Time: 02/25/23  9:28 PM  Result Value Ref Range   Troponin I (High Sensitivity) 20 (H) <18 ng/L    Comment: (NOTE) Elevated high sensitivity  troponin I (hsTnI) values and significant  changes across serial measurements may suggest ACS but many other  chronic and acute conditions are known to elevate hsTnI results.  Refer to the "Links" section for chest pain algorithms and additional  guidance. Performed at [facility], [address], [place], [place] [address]   Troponin I (High Sensitivity)     Status: Abnormal   Collection Time: 02/26/23 12:27 AM  Result Value Ref Range   Troponin I (High Sensitivity) 20 (H) <18 ng/L    Comment: (NOTE) Elevated high sensitivity troponin I (hsTnI) values and significant  changes across serial measurements may suggest ACS but many other  chronic and acute conditions are known to elevate hsTnI results.  Refer to the "Links" section for chest pain algorithms and additional  guidance. Performed at [facility], [address] [place], [place] [address]   Magnesium     Status: None   Collection Time: 02/26/23  9:44 AM  Result Value Ref Range   Magnesium 1.8 1.7 - 2.4 mg/dL    Comment: Performed at [facility], [address], [place], [place] [address]  T4, free     Status: None   Collection Time: 02/26/23  9:44 AM  Result Value Ref Range   Free T4 1.06 0.61 - 1.12 ng/dL    Comment: (NOTE) Biotin ingestion may interfere with free T4 tests. If the results are inconsistent with the TSH level, previous test results, or the clinical presentation, then consider biotin interference. If needed, order repeat testing after stopping biotin. Performed at [facility] Lab, [address] [place], [place] [address]   Comprehensive metabolic panel     Status: Abnormal   Collection Time: 02/26/23  9:44 AM  Result Value Ref Range   Sodium 136 135 - 145 mmol/L   Potassium 3.1 (L) 3.5 - 5.1 mmol/L   Chloride 100 98 - 111 mmol/L   CO2 24 22 - 32 mmol/L   Glucose, Bld 205 (H) 70 - 99 mg/dL    Comment: Glucose reference range applies only to samples taken after fasting for  at least 8 hours.   BUN 34 (H) 6 - 20 mg/dL   Creatinine, Ser 5.78 (H) 0.61 - 1.24 mg/dL  Calcium 8.6 (L) 8.9 - 10.3 mg/dL   Total Protein 5.8 (L) 6.5 - 8.1 g/dL   Albumin 3.4 (L) 3.5 - 5.0 g/dL   AST 42 (H) 15 - 41 U/L   ALT 32 0 - 44 U/L   Alkaline Phosphatase 75 38 - 126 U/L   Total Bilirubin 1.0 0.3 - 1.2 mg/dL   GFR, Estimated 49 (L) >60 mL/min    Comment: (NOTE) Calculated using the CKD-EPI Creatinine Equation (2021)    Anion gap 12 5 - 15    Comment: Performed at [facility] Lab, [address] [place], [place] [address]   DG Knee 1-2 Views Right  Result Date: 02/26/2023 CLINICAL DATA:  42 year old male with history of bilateral knee pain. EXAM: RIGHT KNEE - 1-2 VIEW COMPARISON:  No priors. FINDINGS: Two views of the right knee demonstrate no acute displaced fracture, subluxation or dislocation. There is multifocal joint space narrowing and subchondral sclerosis, most evident in the medial and patellofemoral compartments, indicative of mild osteoarthritis. IMPRESSION: 1. No acute radiographic abnormality of the right knee. 2. Mild osteoarthritis, as above. Electronically Signed   By: Trudie Reed M.D.   On: 02/26/2023 06:30   DG Knee 1-2 Views Left  Result Date: 02/26/2023 CLINICAL DATA:  42 year old male with history of bilateral knee pain. EXAM: LEFT KNEE - 1-2 VIEW COMPARISON:  No priors. FINDINGS: Two views of the left knee demonstrate no acute displaced fracture, subluxation or dislocation. There is joint space narrowing, subchondral sclerosis and osteophyte formation noted, most evident in the medial compartment. IMPRESSION: 1. No acute radiographic abnormality of the left knee. 2. 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 Electronically Signed   By: Trudie Reed M.D.   On: 02/26/2023 06:29    Pending Labs Unresulted Labs (From admission, onward)     Start     Ordered   02/27/23 0500  Comprehensive metabolic panel  Tomorrow morning,   R         02/26/23 1033   02/27/23 0500  CK  Tomorrow morning,   R        02/26/23 1033   02/27/23 0500  Magnesium  Tomorrow morning,   R        02/26/23 1033   02/27/23 0500  Phosphorus  Tomorrow morning,   R        02/26/23 1033   02/27/23 0500  Lipase, blood  Tomorrow morning,   R        02/26/23 1033   02/26/23 1034  Lipase, blood  Add-on,   AD        02/26/23 1033   02/26/23 1034  CK  Add-on,   AD        02/26/23 1033   02/26/23 1020  Rapid urine drug screen (hospital performed)  ONCE - STAT,   STAT        02/26/23 1019            Vitals/Pain Today's Vitals   02/26/23 0451 02/26/23 0730 02/26/23 1001 02/26/23 1015  BP:  137/83  (!) 147/95  Pulse:  (!) 53  (!) 59  Resp:  17  14  Temp: (!) 97.4 F (36.3 C)  98.7 F (37.1 C)   TempSrc: Oral  Oral   SpO2:  97%  97%  Weight:      PainSc:        Isolation Precautions No active isolations  Medications Medications  0.9 % NaCl with KCl 20 mEq/ L  infusion ( Intravenous New Bag/Given 02/26/23 0553)  ondansetron (ZOFRAN) injection 4 mg (has no administration in time range)  heparin injection 5,000 Units (5,000 Units Subcutaneous Patient Refused/Not Given 02/26/23 1331)  acetaminophen (TYLENOL) tablet 650 mg (has no administration in time range)    Or  acetaminophen (TYLENOL) suppository 650 mg (has no administration in time range)  nicotine (NICODERM CQ - dosed in mg/24 hours) patch 14 mg (14 mg Transdermal Patch Applied 02/26/23 1330)  escitalopram (LEXAPRO) tablet 10 mg (10 mg Oral Given 02/26/23 0653)  amLODipine (NORVASC) tablet 5 mg (5 mg Oral Given 02/26/23 0653)  diclofenac Sodium (VOLTAREN) 1 % topical gel 2 g (0 g Topical Hold 02/26/23 1339)  lactated ringers bolus 1,000 mL (0 mLs Intravenous Stopped 02/26/23 0300)  lactated ringers bolus 1,000 mL (0 mLs Intravenous Stopped 02/26/23 0300)  metroNIDAZOLE (FLAGYL) tablet 500 mg (500 mg Oral Given 02/26/23 0239)    Mobility walks     Focused Assessments Cardiac Assessment  Handoff:    No results found for: "CKTOTAL", "CKMB", "CKMBINDEX", "TROPONINI" No results found for: "DDIMER" Does the Patient currently have chest pain? No    R Recommendations: See Admitting Provider Note  Report given to:   Additional Notes:

## 2023-02-26 NOTE — ED Provider Notes (Signed)
[facility] DEPT [facility] Emergency Department Provider Note MRN:  [id number]  Arrival date & time: 02/26/23     Chief Complaint   Bradycardia   History of Present Illness   Gerald Mills is a 42 y.o. year-old male with a history of hypertension presenting to the ED with chief complaint of bradycardia.  Patient explains that he recently had a back injury and then became addicted to the pain medicines.  Decided to detox and so he has been in a detox center for the past 2 days.  He had a difficult 2 days with a lot of vomiting, malaise.  Felt very dehydrated.  Feeling a lot better today.  Was exhibiting bradycardia at the detox center and the detox center felt uncomfortable treating his blood pressure and other conditions and wanted him evaluated.  Denies any chest pain, overall feels well at this time.  Review of Systems  A thorough review of systems was obtained and all systems are negative except as noted in the HPI and PMH.   Patient's Health History    Past Medical History:  Diagnosis Date   Hypertension     History reviewed. No pertinent surgical history.  Family History  Problem Relation Age of Onset   Diabetes Mother    Hypertension Mother    Healthy Father    Hypertension Father     Social History   Socioeconomic History   Marital status: Single    Spouse name: Not on file   Number of children: Not on file   Years of education: Not on file   Highest education level: Not on file  Occupational History   Not on file  Tobacco Use   Smoking status: Every Day    Packs/day: 0.50    Years: 24.00    Additional pack years: 0.00    Total pack years: 12.00    Types: Cigarettes   Smokeless tobacco: Never  Vaping Use   Vaping Use: Never used  Substance and Sexual Activity   Alcohol use: Yes    Comment: occ   Drug use: Never   Sexual activity: Yes    Birth control/protection: None  Other Topics Concern   Not on file  Social History Narrative   Not on  file   Social Determinants of Health   Financial Resource Strain: Not on file  Food Insecurity: Not on file  Transportation Needs: Not on file  Physical Activity: Not on file  Stress: Not on file  Social Connections: Not on file  Intimate Partner Violence: Not on file     Physical Exam   Vitals:   02/26/23 0215 02/26/23 0245  BP: (!) 157/89 (!) 144/99  Pulse: (!) 58 (!) 55  Resp: 16 16  Temp:    SpO2: 100% 94%    CONSTITUTIONAL: Well-appearing, NAD NEURO/PSYCH:  Alert and oriented x 3, no focal deficits EYES:  eyes equal and reactive ENT/NECK:  no LAD, no JVD CARDIO: Regular rate, well-perfused, normal S1 and S2 PULM:  CTAB no wheezing or rhonchi GI/GU:  non-distended, non-tender MSK/SPINE:  No gross deformities, no edema SKIN:  no rash, atraumatic   *Additional and/or pertinent findings included in MDM below  Diagnostic and Interventional Summary    EKG Interpretation  Date/Time:  Friday February 25 2023 21:36:13 EDT Ventricular Rate:  58 PR Interval:  186 QRS Duration: 96 QT Interval:  476 QTC Calculation: 467 R Axis:   58 Text Interpretation: Sinus bradycardia Minimal voltage criteria for LVH, may be  normal variant ( Sokolow-Lyon ) ST & T wave abnormality, consider inferolateral ischemia Prolonged QT Abnormal ECG When compared with ECG of 25-Feb-2023 17:20, PREVIOUS ECG IS PRESENT Confirmed by Kennis Carina [phone]) on 02/26/2023 1:02:21 AM       Labs Reviewed  COMPREHENSIVE METABOLIC PANEL - Abnormal; Notable for the following components:      Result Value   Potassium 3.1 (*)    Chloride 96 (*)    Glucose, Bld 113 (*)    BUN 34 (*)    Creatinine, Ser 2.43 (*)    AST 60 (*)    Total Bilirubin 1.7 (*)    GFR, Estimated 33 (*)    All other components within normal limits  TROPONIN I (HIGH SENSITIVITY) - Abnormal; Notable for the following components:   Troponin I (High Sensitivity) 20 (*)    All other components within normal limits  TROPONIN I (HIGH  SENSITIVITY) - Abnormal; Notable for the following components:   Troponin I (High Sensitivity) 20 (*)    All other components within normal limits  CBC WITH DIFFERENTIAL/PLATELET  MAGNESIUM    No orders to display    Medications  lactated ringers bolus 1,000 mL (1,000 mLs Intravenous New Bag/Given 02/26/23 0156)  lactated ringers bolus 1,000 mL (1,000 mLs Intravenous New Bag/Given 02/26/23 0156)  metroNIDAZOLE (FLAGYL) tablet 500 mg (500 mg Oral Given 02/26/23 0239)     Procedures  /  Critical Care Procedures  ED Course and Medical Decision Making  Initial Impression and Ddx Patient recovering from opioid withdrawal, looks well with reassuring vital signs on my evaluation heart rate in the 70s.  On arrival did have an EKG demonstrating sinus bradycardia with a rate in the 40s.  Denies having any chest pain or shortness of breath.  Seems like the bradycardia is completely asymptomatic.  Past medical/surgical history that increases complexity of ED encounter: Hypertension  Interpretation of Diagnostics I personally reviewed the EKG and my interpretation is as follows: Sinus bradycardia with nonspecific T wave abnormality.  Labs reveal acute kidney injury, minimally elevated troponin that is flat upon repeat.  Patient Reassessment and Ultimate Disposition/Management     Providing 2 L of fluid and will reassess.  Given patient's clinical improvement I suspect he is through the worst of the withdrawal and I suspect this kidney injury will resolve with fluid resuscitation.  If continues to look and feel well would be a candidate for discharge with return precautions, would like his kidney function rechecked within the next week.  3 AM update: Patient with some continued malaise that he attributes to withdrawal.  We discussed options, overall we are suspicious that the detox center will be even more uncomfortable taking care of him given the dehydration and acute kidney injury.  Will request  hospitalist admission.  Patient management required discussion with the following services or consulting groups:  Hospitalist Service  Complexity of Problems Addressed Acute illness or injury that poses threat of life of bodily function  Additional Data Reviewed and Analyzed Further history obtained from: Prior labs/imaging results  Additional Factors Impacting ED Encounter Risk Consideration of hospitalization  Elmer Sow. Pilar Plate, MD [facility] Emergency Medicine [facility] [email or web address]  Final Clinical Impressions(s) / ED Diagnoses     ICD-10-CM   1. Exposure to trichomonas  Z20.2     2. Dehydration  E86.0     3. AKI (acute kidney injury)  N17.9     4. Bradycardia  R00.1     5.  Opioid withdrawal  F11.93       ED Discharge Orders     None        Discharge Instructions Discussed with and Provided to Patient:   Discharge Instructions   None      Sabas Sous, MD 02/26/23 0330

## 2023-02-26 NOTE — Plan of Care (Signed)

## 2023-02-27 DIAGNOSIS — F17210 Nicotine dependence, cigarettes, uncomplicated: Secondary | ICD-10-CM | POA: Diagnosis not present

## 2023-02-27 DIAGNOSIS — F1120 Opioid dependence, uncomplicated: Secondary | ICD-10-CM

## 2023-02-27 DIAGNOSIS — F4321 Adjustment disorder with depressed mood: Secondary | ICD-10-CM | POA: Diagnosis not present

## 2023-02-27 LAB — COMPREHENSIVE METABOLIC PANEL
ALT: 31 U/L (ref 0–44)
AST: 32 U/L (ref 15–41)
Albumin: 3.4 g/dL — ABNORMAL LOW (ref 3.5–5.0)
Alkaline Phosphatase: 62 U/L (ref 38–126)
Anion gap: 8 (ref 5–15)
BUN: 21 mg/dL — ABNORMAL HIGH (ref 6–20)
CO2: 27 mmol/L (ref 22–32)
Calcium: 8.5 mg/dL — ABNORMAL LOW (ref 8.9–10.3)
Chloride: 102 mmol/L (ref 98–111)
Creatinine, Ser: 1.39 mg/dL — ABNORMAL HIGH (ref 0.61–1.24)
GFR, Estimated: >60 mL/min (ref >60)
Glucose, Bld: 185 mg/dL — ABNORMAL HIGH (ref 70–99)
Potassium: 3.3 mmol/L — ABNORMAL LOW (ref 3.5–5.1)
Sodium: 137 mmol/L (ref 135–145)
Total Bilirubin: 1.2 mg/dL (ref 0.3–1.2)
Total Protein: 6.1 g/dL — ABNORMAL LOW (ref 6.5–8.1)

## 2023-02-27 LAB — PHOSPHORUS
Phosphorus: 1.4 mg/dL — ABNORMAL LOW (ref 2.5–4.6)
Phosphorus: 1.3 mg/dL — ABNORMAL LOW (ref 2.5–4.6)

## 2023-02-27 LAB — MAGNESIUM: Magnesium: 1.7 mg/dL (ref 1.7–2.4)

## 2023-02-27 LAB — CK: Total CK: 741 U/L — ABNORMAL HIGH (ref 49–397)

## 2023-02-27 LAB — LIPASE, BLOOD: Lipase: 32 U/L (ref 11–51)

## 2023-02-27 MED ORDER — ENOXAPARIN SODIUM 40 MG/0.4ML IJ SOSY
40.0000 mg | PREFILLED_SYRINGE | INTRAMUSCULAR | Status: DC
  Administered 2023-02-27: 40 mg via SUBCUTANEOUS
  Filled 2023-02-27 (×2): qty 0.4

## 2023-02-27 MED ORDER — POTASSIUM PHOSPHATES 15 MMOLE/5ML IV SOLN
30.0000 mmol | Freq: Once | INTRAVENOUS | Status: AC
  Administered 2023-02-27: 30 mmol via INTRAVENOUS
  Filled 2023-02-27: qty 10

## 2023-02-27 MED ORDER — K PHOS MONO-SOD PHOS DI & MONO 155-852-130 MG PO TABS
500.0000 mg | ORAL_TABLET | Freq: Two times a day (BID) | ORAL | Status: AC
  Administered 2023-02-27 – 2023-02-28 (×4): 500 mg via ORAL
  Filled 2023-02-27 (×4): qty 2

## 2023-02-27 MED ORDER — HYDRALAZINE HCL 20 MG/ML IJ SOLN
10.0000 mg | INTRAMUSCULAR | Status: DC | PRN
  Administered 2023-02-27 – 2023-02-28 (×2): 10 mg via INTRAVENOUS
  Filled 2023-02-27 (×2): qty 1

## 2023-02-27 MED ORDER — HYDRALAZINE HCL 25 MG PO TABS
25.0000 mg | ORAL_TABLET | Freq: Three times a day (TID) | ORAL | Status: DC
  Administered 2023-02-27 – 2023-02-28 (×3): 25 mg via ORAL
  Filled 2023-02-27 (×4): qty 1

## 2023-02-27 MED ORDER — LOSARTAN POTASSIUM 50 MG PO TABS
50.0000 mg | ORAL_TABLET | Freq: Every day | ORAL | Status: DC
  Administered 2023-02-27: 50 mg via ORAL
  Filled 2023-02-27: qty 1

## 2023-02-27 NOTE — Consult Note (Signed)
[facility] Face-to-Face Psychiatry Consult   Reason for Consult: ''opiate abuse, depression, going through divorce feels depressed.''  Referring Physician:  Lanae Boast, MD Patient Identification: Gerald Mills MRN:  [id number] Principal Diagnosis: AKI (acute kidney injury) Diagnosis:  Principal Problem:   AKI (acute kidney injury) Active Problems:   Current smoker   Nausea and vomiting   Hypokalemia   Opioid use disorder, moderate, dependence   Adjustment disorder with depressed mood   Total Time spent with patient: 1 hour  Subjective:   Gerald Mills is a 42 y.o. male patient admitted with Bradycardia.  HPI:  42 year old Male with PMH of HTN, osteoarthritis, noncompliance, tobacco use disorder and opiate use disorder sent to ED from detox due to sinus bradycardia. He was admitted to the hospital for AKI, intractable nausea, vomiting and sinus bradycardia. However, psychiatric consultation was initiated after patient reports being depressed due to going through divorce. Patient reports that he was never married to his partner but they were in a relationship for about 20 years until few months ago when she asked for separation. He reports that since then, has been feeling depressed, socially withdrawn due to low energy level and lack of motivation.However, patient denies poor appetite, insomnia, self harming thoughts, delusions and psychosis. He agreed to medication management for depression and referral to an outpatient facility to counseling.   Past Psychiatric History: as above  Risk to Self:  denies Risk to Others:  denies Prior Inpatient Therapy:  none reported by patient Prior Outpatient Therapy:  none per patient  Past Medical History:  Past Medical History:  Diagnosis Date   Hypertension    History reviewed. No pertinent surgical history. Family History:  Family History  Problem Relation Age of Onset   Diabetes Mother    Hypertension Mother    Healthy Father    Hypertension  Father    Family Psychiatric  History:   Social History:  Social History   Substance and Sexual Activity  Alcohol Use Yes   Comment: occ     Social History   Substance and Sexual Activity  Drug Use Never    Social History   Socioeconomic History   Marital status: Single    Spouse name: Not on file   Number of children: Not on file   Years of education: Not on file   Highest education level: Not on file  Occupational History   Not on file  Tobacco Use   Smoking status: Every Day    Packs/day: 0.50    Years: 24.00    Additional pack years: 0.00    Total pack years: 12.00    Types: Cigarettes   Smokeless tobacco: Never  Vaping Use   Vaping Use: Never used  Substance and Sexual Activity   Alcohol use: Yes    Comment: occ   Drug use: Never   Sexual activity: Yes    Birth control/protection: None  Other Topics Concern   Not on file  Social History Narrative   Not on file   Social Determinants of Health   Financial Resource Strain: Not on file  Food Insecurity: No Food Insecurity (02/26/2023)   Hunger Vital Sign    Worried About Running Out of Food in the Last Year: Never true    Ran Out of Food in the Last Year: Never true  Transportation Needs: No Transportation Needs (02/26/2023)   PRAPARE - Administrator, Civil Service (Medical): No    Lack of Transportation (Non-Medical): No  Physical Activity: Not on file  Stress: Not on file  Social Connections: Not on file   Additional Social History:    Allergies:  No Known Allergies  Labs:  Results for orders placed or performed during the hospital encounter of 02/25/23 (from the past 48 hour(s))  Comprehensive metabolic panel     Status: Abnormal   Collection Time: 02/25/23  9:28 PM  Result Value Ref Range   Sodium 135 135 - 145 mmol/L   Potassium 3.1 (L) 3.5 - 5.1 mmol/L   Chloride 96 (L) 98 - 111 mmol/L   CO2 27 22 - 32 mmol/L   Glucose, Bld 113 (H) 70 - 99 mg/dL    Comment: Glucose  reference range applies only to samples taken after fasting for at least 8 hours.   BUN 34 (H) 6 - 20 mg/dL   Creatinine, Ser 1.61 (H) 0.61 - 1.24 mg/dL   Calcium 9.5 8.9 - 09.6 mg/dL   Total Protein 7.8 6.5 - 8.1 g/dL   Albumin 4.8 3.5 - 5.0 g/dL   AST 60 (H) 15 - 41 U/L   ALT 36 0 - 44 U/L   Alkaline Phosphatase 76 38 - 126 U/L   Total Bilirubin 1.7 (H) 0.3 - 1.2 mg/dL   GFR, Estimated 33 (L) >60 mL/min    Comment: (NOTE) Calculated using the CKD-EPI Creatinine Equation (2021)    Anion gap 12 5 - 15    Comment: Performed at [facility], [address] [place], [place] [address]  CBC with Differential     Status: None   Collection Time: 02/25/23  9:28 PM  Result Value Ref Range   WBC 8.7 4.0 - 10.5 K/uL   RBC 4.64 4.22 - 5.81 MIL/uL   Hemoglobin 13.6 13.0 - 17.0 g/dL   HCT 98.1 19.1 - 47.8 %   MCV 92.2 80.0 - 100.0 fL   MCH 29.3 26.0 - 34.0 pg   MCHC 31.8 30.0 - 36.0 g/dL   RDW 29.5 62.1 - 30.8 %   Platelets 215 150 - 400 K/uL   nRBC 0.0 0.0 - 0.2 %   Neutrophils Relative % 68 %   Neutro Abs 5.9 1.7 - 7.7 K/uL   Lymphocytes Relative 23 %   Lymphs Abs 2.0 0.7 - 4.0 K/uL   Monocytes Relative 9 %   Monocytes Absolute 0.8 0.1 - 1.0 K/uL   Eosinophils Relative 0 %   Eosinophils Absolute 0.0 0.0 - 0.5 K/uL   Basophils Relative 0 %   Basophils Absolute 0.0 0.0 - 0.1 K/uL   Immature Granulocytes 0 %   Abs Immature Granulocytes 0.03 0.00 - 0.07 K/uL    Comment: Performed at [facility], [address] [place], [place] [address]  Magnesium     Status: None   Collection Time: 02/25/23  9:28 PM  Result Value Ref Range   Magnesium 2.0 1.7 - 2.4 mg/dL    Comment: Performed at [facility], [address], [place], [place] [address]  Troponin I (High Sensitivity)     Status: Abnormal   Collection Time: 02/25/23  9:28 PM  Result Value Ref Range   Troponin I (High Sensitivity) 20 (H) <18 ng/L    Comment: (NOTE) Elevated high sensitivity troponin I (hsTnI)  values and significant  changes across serial measurements may suggest ACS but many other  chronic and acute conditions are known to elevate hsTnI results.  Refer to the "Links" section for chest pain  algorithms and additional  guidance. Performed at [facility], [address], [place], [place] [address]   Troponin I (High Sensitivity)     Status: Abnormal   Collection Time: 02/26/23 12:27 AM  Result Value Ref Range   Troponin I (High Sensitivity) 20 (H) <18 ng/L    Comment: (NOTE) Elevated high sensitivity troponin I (hsTnI) values and significant  changes across serial measurements may suggest ACS but many other  chronic and acute conditions are known to elevate hsTnI results.  Refer to the "Links" section for chest pain algorithms and additional  guidance. Performed at [facility], [address] [place], [place] [address]   Magnesium     Status: None   Collection Time: 02/26/23  9:44 AM  Result Value Ref Range   Magnesium 1.8 1.7 - 2.4 mg/dL    Comment: Performed at [facility], [address] [place], [place] [address]  T4, free     Status: None   Collection Time: 02/26/23  9:44 AM  Result Value Ref Range   Free T4 1.06 0.61 - 1.12 ng/dL    Comment: (NOTE) Biotin ingestion may interfere with free T4 tests. If the results are inconsistent with the TSH level, previous test results, or the clinical presentation, then consider biotin interference. If needed, order repeat testing after stopping biotin. Performed at [facility], [address] [place], [place] [address]   Comprehensive metabolic panel     Status: Abnormal   Collection Time: 02/26/23  9:44 AM  Result Value Ref Range   Sodium 136 135 - 145 mmol/L   Potassium 3.1 (L) 3.5 - 5.1 mmol/L   Chloride 100 98 - 111 mmol/L   CO2 24 22 - 32 mmol/L   Glucose, Bld 205 (H) 70 - 99 mg/dL    Comment: Glucose reference range applies only to samples taken after fasting for at least 8 hours.    BUN 34 (H) 6 - 20 mg/dL   Creatinine, Ser 4.40 (H) 0.61 - 1.24 mg/dL   Calcium 8.6 (L) 8.9 - 10.3 mg/dL   Total Protein 5.8 (L) 6.5 - 8.1 g/dL   Albumin 3.4 (L) 3.5 - 5.0 g/dL   AST 42 (H) 15 - 41 U/L   ALT 32 0 - 44 U/L   Alkaline Phosphatase 75 38 - 126 U/L   Total Bilirubin 1.0 0.3 - 1.2 mg/dL   GFR, Estimated 49 (L) >60 mL/min    Comment: (NOTE) Calculated using the CKD-EPI Creatinine Equation (2021)    Anion gap 12 5 - 15    Comment: Performed at [facility], [address] [place], [place] [address]  Lipase, blood     Status: None   Collection Time: 02/26/23  9:44 AM  Result Value Ref Range   Lipase 31 11 - 51 U/L    Comment: Performed at [facility], [address] [place], [place] [address]  CK     Status: Abnormal   Collection Time: 02/26/23  9:44 AM  Result Value Ref Range   Total CK 1,524 (H) 49 - 397 U/L    Comment: Performed at [facility], [address] [place], [place] [address]  Rapid urine drug screen (hospital performed)     Status: Abnormal   Collection Time: 02/26/23 10:20 AM  Result Value Ref Range   Opiates POSITIVE (A) NONE DETECTED   Cocaine NONE DETECTED NONE DETECTED   Benzodiazepines NONE  DETECTED NONE DETECTED   Amphetamines NONE DETECTED NONE DETECTED   Tetrahydrocannabinol NONE DETECTED NONE DETECTED   Barbiturates NONE DETECTED NONE DETECTED    Comment: (NOTE) DRUG SCREEN FOR MEDICAL PURPOSES ONLY.  IF CONFIRMATION IS NEEDED FOR ANY PURPOSE, NOTIFY LAB WITHIN 5 DAYS.  LOWEST DETECTABLE LIMITS FOR URINE DRUG SCREEN Drug Class                     Cutoff (ng/mL) Amphetamine and metabolites    1000 Barbiturate and metabolites    200 Benzodiazepine                 200 Opiates and metabolites        300 Cocaine and metabolites        300 THC                            50 Performed at [facility], [address] [place], [place] [address]   Comprehensive metabolic panel     Status: Abnormal   Collection  Time: 02/27/23  1:00 AM  Result Value Ref Range   Sodium 137 135 - 145 mmol/L   Potassium 3.3 (L) 3.5 - 5.1 mmol/L   Chloride 102 98 - 111 mmol/L   CO2 27 22 - 32 mmol/L   Glucose, Bld 185 (H) 70 - 99 mg/dL    Comment: Glucose reference range applies only to samples taken after fasting for at least 8 hours.   BUN 21 (H) 6 - 20 mg/dL   Creatinine, Ser 4.09 (H) 0.61 - 1.24 mg/dL   Calcium 8.5 (L) 8.9 - 10.3 mg/dL   Total Protein 6.1 (L) 6.5 - 8.1 g/dL   Albumin 3.4 (L) 3.5 - 5.0 g/dL   AST 32 15 - 41 U/L   ALT 31 0 - 44 U/L   Alkaline Phosphatase 62 38 - 126 U/L   Total Bilirubin 1.2 0.3 - 1.2 mg/dL   GFR, Estimated >81 >19 mL/min    Comment: (NOTE) Calculated using the CKD-EPI Creatinine Equation (2021)    Anion gap 8 5 - 15    Comment: Performed at [facility], [address], [place], [place] [address]  CK     Status: Abnormal   Collection Time: 02/27/23  1:00 AM  Result Value Ref Range   Total CK 741 (H) 49 - 397 U/L    Comment: Performed at [facility], [address] [place], [place] [address]  Magnesium     Status: None   Collection Time: 02/27/23  1:00 AM  Result Value Ref Range   Magnesium 1.7 1.7 - 2.4 mg/dL    Comment: Performed at [facility], [address], [place], [place] [address]  Phosphorus     Status: Abnormal   Collection Time: 02/27/23  1:00 AM  Result Value Ref Range   Phosphorus 1.4 (L) 2.5 - 4.6 mg/dL    Comment: Performed at [facility], [address] [place], [place] [address]  Lipase, blood     Status: None   Collection Time: 02/27/23  1:00 AM  Result Value Ref Range   Lipase 32 11 - 51 U/L    Comment: Performed at [facility], [address], [place], [place] [address]    Current Facility-Administered Medications  Medication Dose Route Frequency Provider Last Rate Last Admin   acetaminophen (TYLENOL) tablet 650 mg  650 mg Oral Q6H PRN Gery Pray, MD   650 mg at 02/27/23 1610   Or   acetaminophen  (TYLENOL) suppository 650 mg  650 mg Rectal Q6H PRN Joneen Roach, Debby, MD       amLODipine (NORVASC) tablet 10 mg  10 mg Oral Daily Candelaria Stagers T, MD   10 mg at 02/27/23 9604   diclofenac Sodium (VOLTAREN) 1 % topical gel 2 g  2 g Topical QID Candelaria Stagers T, MD   2 g at 02/27/23 0942   escitalopram (LEXAPRO) tablet 10 mg  10 mg Oral Daily Gery Pray, MD   10 mg at 02/27/23 0946   heparin injection 5,000 Units  5,000 Units Subcutaneous Q8H Crosley, Debby, MD   5,000 Units at 02/26/23 5409   hydrALAZINE (APRESOLINE) tablet 25 mg  25 mg Oral Q6H PRN Candelaria Stagers T, MD   25 mg at 02/27/23 0004   hydrALAZINE (APRESOLINE) tablet 25 mg  25 mg Oral Q8H Kc, Ramesh, MD       nicotine (NICODERM CQ - dosed in mg/24 hours) patch 14 mg  14 mg Transdermal Daily Crosley, Debby, MD   14 mg at 02/26/23 1330   ondansetron (ZOFRAN) injection 4 mg  4 mg Intravenous Q6H PRN Crosley, Debby, MD       phosphorus (K PHOS NEUTRAL) tablet 500 mg  500 mg Oral BID Kc, Ramesh, MD   500 mg at 02/27/23 8119   potassium PHOSPHATE 30 mmol in dextrose 5 % 500 mL infusion  30 mmol Intravenous Once Kc, Dayna Barker, MD 85 mL/hr at 02/27/23 0940 30 mmol at 02/27/23 0940    Musculoskeletal: Strength & Muscle Tone: within normal limits Gait & Station: normal Patient leans: N/A    Psychiatric Specialty Exam:  Presentation  General Appearance:  Appropriate for Environment  Eye Contact: Good  Speech: Clear and Coherent  Speech Volume: Normal  Handedness: Right   Mood and Affect  Mood: Dysphoric  Affect: Appropriate   Thought Process  Thought Processes: Linear; Goal Directed  Descriptions of Associations:Intact  Orientation:Full (Time, Place and Person)  Thought Content:Logical  History of Schizophrenia/Schizoaffective disorder:No  Duration of Psychotic Symptoms:No data recorded Hallucinations:Hallucinations: None  Ideas of Reference:None  Suicidal Thoughts:Suicidal Thoughts: No  Homicidal  Thoughts:Homicidal Thoughts: No   Sensorium  Memory: Immediate Good; Recent Good; Remote Good  Judgment: Intact  Insight: Fair   Art therapist  Concentration: Good  Attention Span: Good  Recall: Good  Fund of Knowledge: Good  Language: Good   Psychomotor Activity  Psychomotor Activity: Psychomotor Activity: Normal   Assets  Assets: Communication Skills; Desire for Improvement   Sleep  Sleep: Sleep: Fair   Physical Exam: Physical Exam Review of Systems  Psychiatric/Behavioral:  Positive for substance abuse.    Blood pressure (!) 163/97, pulse 65, temperature 99 F (37.2 C), temperature source Oral, resp. rate 18, height 6\' 3"  (1.905 m), weight 115.2 kg, SpO2 100 %. Body mass index is 31.74 kg/m.  Treatment Plan Summary: Medication management and Plan Continue Lexapro 10 mg daily for depression -Consider referring patient to outpatient psychiatric facility for counseling  Disposition: No evidence of imminent risk to self or others at present.   Patient does not meet criteria for psychiatric inpatient admission. Supportive therapy provided about ongoing stressors. Psychiatric service signing off. Re-consult as needed  Thedore Mins, MD 02/27/2023 11:26 AM

## 2023-02-27 NOTE — Progress Notes (Signed)
PROGRESS NOTE GRAM SIEDLECKI  ZOX:[id number] DOB: Jan 06, 1981 DOA: 02/25/2023 PCP: Pcp, No  Brief Narrative/Hospital Course: 42yo M with PMH of HTN, mood disorder, osteoarthritis, noncompliance, tobacco use disorder and opiate use disorder sent to ED from detox due to bradycardia to 40s. Patient had nausea, vomiting and dehydration for about 2 days In ED found to have acute renal failure,sinus bradycardia, electrolyte imbalance, drug screen showed opiates positive, mild rhabdomyolysis CK1 524, imaging of the right and left knees osteoarthritis.He has been detoxing from pain meds for 2 days PTA. Patient was admitted for further management    Subjective: Seen this am ,mildly sweaty c/o pain in hands  and knees No nausea or vomiting today Feels weak   Assessment and Plan: Principal Problem:   AKI (acute kidney injury) Active Problems:   Current smoker   Nausea and vomiting   Hypokalemia   Sinus bradycardia: Likely in the setting of nausea vomiting, electrolyte imbalance.  Heart rate overall improved in 50s to 60s, not on beta-blocker, TSH slightly 0.3-but normal free T4.  Monitor  Intractable nausea and vomiting: Suspected opioid withdrawal continue symptomatic management antiemetics electrolyte replacement, diet as tolerated  Acute kidney injury: Prerenal dehydration,+/- NSAID use in the setting of nausea vomiting nicely improving.  Encourage p.o. Recent Labs    02/25/23 1735 02/25/23 2128 02/26/23 0944 02/27/23 0100  BUN 35* 34* 34* 21*  CREATININE 2.67* 2.43* 1.76* 1.39*    Mild rhabdomyolysis improving encourage p.o. Recent Labs  Lab 02/26/23 0944 02/27/23 0100  CKTOTAL 1,524* 741*    Electrolyte imbalance: Hypokalemia/hypophosphatemia: Will replace and recheck Recent Labs  Lab 02/25/23 1735 02/25/23 2128 02/26/23 0944 02/27/23 0100  K 3.5 3.1* 3.1* 3.3*  CALCIUM 9.3 9.5 8.6* 8.5*  MG 2.0 2.0 1.8 1.7  PHOS  --   --   --  1.4*  No results found for: "PTH"     Essential hypertension BP poorly controlled, continue amlodipine, losartan HCTZ remains on hold due to AKI and electrolyte imbalance start hydralazine p.o.  Hyperbilirubinemia resolved  Opiate use disorder reports using oxycodone not able to quantify was restarted on detox x 2 days prior to presentation stable now [facility] consulted avoid clonidine due to bradycardia. Will get psyhc to see as well-see below Low mood/going through divorce- interested in psych eval  Mood disorder stable Tobacco use disorder continue continue with PCP need TOC consulted  Class I Obesity:Patient's Body mass index is 31.74 kg/m. : Will benefit with PCP follow-up, weight loss  healthy lifestyle and outpatient sleep evaluation.   DVT prophylaxis: heparin injection 5,000 Units Start: 02/26/23 0600 Code Status:   Code Status: Full Code Family Communication: plan of care discussed with patient  at bedside. Patient status is:  inpatient  because of aki Level of care: Telemetry Medical   Dispo: The patient is from: home            Anticipated disposition: TBD Objective: Vitals last 24 hrs: Vitals:   02/26/23 2109 02/27/23 0221 02/27/23 0613 02/27/23 0843  BP: (!) 191/109 (!) 162/103 (!) 181/109 (!) 163/97  Pulse: (!) 50 64 (!) 59 65  Resp: Temp: 98.7 F (37.1 C) 100 F (37.8 C) 100.2 F (37.9 C) 99 F (37.2 C)  TempSrc: Oral Oral Oral Oral  SpO2: 99% 98% 97% 100%  Weight:      Height:       Weight change: -5.942 kg  Physical Examination: CLEARGeneral exam: alert awake, older than stated  age HEENT:Oral mucosa moist, Ear/Nose WNL grossly Respiratory system: bilaterally  BS, no use of accessory muscle Cardiovascular system: S1 & S2 +, No JVD. Gastrointestinal system: Abdomen soft,NT,ND, BS+ Nervous System:Alert, awake, moving extremities. Extremities: LE edema NEG,distal peripheral pulses palpable.  Skin: No rashes,no icterus. MSK: Normal muscle bulk,tone, power  Medications  reviewed:  Scheduled Meds:  amLODipine  10 mg Oral Daily   diclofenac Sodium  2 g Topical QID   escitalopram  10 mg Oral Daily   heparin  5,000 Units Subcutaneous Q8H   hydrALAZINE  25 mg Oral Q8H   nicotine  14 mg Transdermal Daily   phosphorus  500 mg Oral BID   Continuous Infusions:  potassium PHOSPHATE IVPB (in mmol) 30 mmol (02/27/23 0940)      Diet Order             Diet Heart Room service appropriate? Yes; Fluid consistency: Thin  Diet effective now                   Intake/Output Summary (Last 24 hours) at 02/27/2023 0954 Last data filed at 02/27/2023 0900 Gross per 24 hour  Intake 1467.21 ml  Output 480 ml  Net 987.21 ml   Net IO Since Admission: 987.21 mL [02/27/23 0954]  Wt Readings from Last 3 Encounters:  02/26/23 115.2 kg  08/12/21 115.4 kg  03/16/21 119.9 kg     Unresulted Labs (From admission, onward)     Start     Ordered   02/28/23 0500  Basic metabolic panel  Daily,   R      02/27/23 0952   02/27/23 2000  Phosphorus  (Serum Phosphorus)  Once-Timed,   TIMED        02/27/23 0821          Data Reviewed: I have personally reviewed following labs and imaging studies CBC: Recent Labs  Lab 02/25/23 1735 02/25/23 2128  WBC 8.7 8.7  NEUTROABS 6.1 5.9  HGB 13.0 13.6  HCT 40.4 42.8  MCV 92.0 92.2  PLT 195 215   Basic Metabolic Panel: Recent Labs  Lab 02/25/23 1735 02/25/23 2128 02/26/23 0944 02/27/23 0100  NA 138 135 136 137  K 3.5 3.1* 3.1* 3.3*  CL 97* 96* 100 102  CO2 GLUCOSE 136* 113* 205* 185*  BUN 35* 34* 34* 21*  CREATININE 2.67* 2.43* 1.76* 1.39*  CALCIUM 9.3 9.5 8.6* 8.5*  MG 2.0 2.0 1.8 1.7  PHOS  --   --   --  1.4*   GFR: Estimated Creatinine Clearance: 95.8 mL/min (A) (by C-G formula based on SCr of 1.39 mg/dL (H)). Liver Function Tests: Recent Labs  Lab 02/25/23 1735 02/25/23 2128 02/26/23 0944 02/27/23 0100  AST 59* 60* 42* 32  ALT 32 36 32 31  ALKPHOS 76 76 75 62  BILITOT 1.9* 1.7* 1.0  1.2  PROT 7.2 7.8 5.8* 6.1*  ALBUMIN 4.8 4.8 3.4* 3.4*   Recent Labs  Lab 02/26/23 0944 02/27/23 0100  LIPASE 31 32   Recent Labs    02/25/23 1645 02/26/23 0944  TSH 0.332*  --   FREET4  --  1.06   Sepsis Labs: No results for input(s): "PROCALCITON", "LATICACIDVEN" in the last 168 hours.  Recent Results (from the past 240 hour(s))  SARS Coronavirus 2 by RT PCR (hospital order, performed in [facility] hospital lab) *cepheid single result test* Anterior Nasal Swab     Status: None   Collection Time: 02/25/23  5:35 PM   Specimen: Anterior Nasal Swab  Result Value Ref Range Status   SARS Coronavirus 2 by RT PCR NEGATIVE NEGATIVE Final    Comment: Performed at [facility], [address] [place], [place] [address]    Antimicrobials: Anti-infectives (From admission, onward)    Start     Dose/Rate Route Frequency Ordered Stop   02/26/23 0130  metroNIDAZOLE (FLAGYL) tablet 500 mg        500 mg Oral  Once 02/26/23 0122 02/26/23 0239      Culture/Microbiology    Component Value Date/Time   SDES ABSCESS GROIN 08/17/2007 1430   SPECREQUEST NONE 08/17/2007 1430   CULT  08/17/2007 1430    MULTIPLE ORGANISMS PRESENT, NONE PREDOMINANT Note: NO STAPHYLOCOCCUS AUREUS ISOLATED NO GROUP A STREP (S.PYOGENES) ISOLATED   REPTSTATUS 08/20/2007 FINAL 08/17/2007 1430   Radiology Studies: DG Knee 1-2 Views Right  Result Date: 02/26/2023 CLINICAL DATA:  42 year old male with history of bilateral knee pain. EXAM: RIGHT KNEE - 1-2 VIEW COMPARISON:  No priors. FINDINGS: Two views of the right knee demonstrate no acute displaced fracture, subluxation or dislocation. There is multifocal joint space narrowing and subchondral sclerosis, most evident in the medial and patellofemoral compartments, indicative of mild osteoarthritis. IMPRESSION: 1. No acute radiographic abnormality of the right knee. 2. Mild osteoarthritis, as above. Electronically Signed   By: Trudie Reed M.D.   On:  02/26/2023 06:30   DG Knee 1-2 Views Left  Result Date: 02/26/2023 CLINICAL DATA:  43 year old male with history of bilateral knee pain. EXAM: LEFT KNEE - 1-2 VIEW COMPARISON:  No priors. FINDINGS: Two views of the left knee demonstrate no acute displaced fracture, subluxation or dislocation. There is joint space narrowing, subchondral sclerosis and osteophyte formation noted, most evident in the medial compartment. IMPRESSION: 1. No acute radiographic abnormality of the left knee. 2. 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 Electronically Signed   By: Trudie Reed M.D.   On: 02/26/2023 06:29     LOS: 0 days   Lanae Boast, MD [facility]  02/27/2023, 9:54 AM

## 2023-02-27 NOTE — Hospital Course (Addendum)
42yo M with PMH of HTN, mood disorder, osteoarthritis, noncompliance, tobacco use disorder and opiate use disorder sent to ED from detox due to bradycardia to 40s. Patient had nausea, vomiting and dehydration for about 2 days In ED found to have acute renal failure,sinus bradycardia, electrolyte imbalance, drug screen showed opiates positive, mild rhabdomyolysis CK1 524, imaging of the right and left knees osteoarthritis.He has been detoxing from pain meds for 2 days PTA. Patient was admitted for further management Patient was managed with IV fluid hydration symptomatic management.  He is going into opiate withdrawals but he refused to be on methadone or Suboxone at this point and managed symptomatically. AKI has resolved, he had significant blood imbalance and improved, overall doing much better BP is well-controlled and medication has been adjusted At this time is tolerating diet no more diarrhea anxiety sweating.  He feels improved and ready for discharge.  Provided with PCP follow-up, and also prescription

## 2023-02-27 NOTE — Progress Notes (Signed)
New Admission Note:   Arrival Method: ER bed Mental Orientation: Alert and orientedx4 Telemetry: Box [id number] Assessment: Completed Skin: intact IV: LAC Pain: 6/10 Tubes: none Safety Measures: Safety Fall Prevention Plan has been given, discussed and signed Admission: Completed [facility] Orientation: Patient has been orientated to the room, unit and staff.  Family: none  Orders have been reviewed and implemented. Will continue to monitor the patient. Call light has been placed within reach and bed alarm has been activated.   Stacie Glaze LPN [facility] Phone: [phone]

## 2023-02-28 LAB — BASIC METABOLIC PANEL
Anion gap: 13 (ref 5–15)
BUN: 11 mg/dL (ref 6–20)
CO2: 26 mmol/L (ref 22–32)
Calcium: 9.1 mg/dL (ref 8.9–10.3)
Chloride: 97 mmol/L — ABNORMAL LOW (ref 98–111)
Creatinine, Ser: 1.13 mg/dL (ref 0.61–1.24)
GFR, Estimated: >60 mL/min (ref >60)
Glucose, Bld: 135 mg/dL — ABNORMAL HIGH (ref 70–99)
Potassium: 3.1 mmol/L — ABNORMAL LOW (ref 3.5–5.1)
Sodium: 136 mmol/L (ref 135–145)

## 2023-02-28 LAB — PHOSPHORUS: Phosphorus: 1.9 mg/dL — ABNORMAL LOW (ref 2.5–4.6)

## 2023-02-28 LAB — POTASSIUM: Potassium: 3.1 mmol/L — ABNORMAL LOW (ref 3.5–5.1)

## 2023-02-28 MED ORDER — METHOCARBAMOL 500 MG PO TABS
500.0000 mg | ORAL_TABLET | Freq: Three times a day (TID) | ORAL | Status: DC | PRN
  Administered 2023-02-28 (×2): 500 mg via ORAL
  Filled 2023-02-28 (×2): qty 1

## 2023-02-28 MED ORDER — LORAZEPAM 0.5 MG PO TABS
0.5000 mg | ORAL_TABLET | Freq: Three times a day (TID) | ORAL | Status: DC | PRN
  Administered 2023-02-28: 0.5 mg via ORAL
  Filled 2023-02-28: qty 1

## 2023-02-28 MED ORDER — POTASSIUM CHLORIDE CRYS ER 20 MEQ PO TBCR
40.0000 meq | EXTENDED_RELEASE_TABLET | Freq: Once | ORAL | Status: AC
  Administered 2023-02-28: 40 meq via ORAL
  Filled 2023-02-28: qty 2

## 2023-02-28 MED ORDER — HYDROCHLOROTHIAZIDE 25 MG PO TABS: 25.0000 mg | ORAL_TABLET | Freq: Every day | ORAL | Status: DC

## 2023-02-28 MED ORDER — POTASSIUM PHOSPHATES 15 MMOLE/5ML IV SOLN
30.0000 mmol | Freq: Once | INTRAVENOUS | Status: AC
  Administered 2023-02-28: 30 mmol via INTRAVENOUS
  Filled 2023-02-28: qty 10

## 2023-02-28 MED ORDER — HYDRALAZINE HCL 50 MG PO TABS
50.0000 mg | ORAL_TABLET | Freq: Three times a day (TID) | ORAL | Status: DC
  Administered 2023-02-28 – 2023-03-01 (×3): 50 mg via ORAL
  Filled 2023-02-28 (×2): qty 1

## 2023-02-28 MED ORDER — DICYCLOMINE HCL 20 MG PO TABS: 20.0000 mg | ORAL_TABLET | Freq: Four times a day (QID) | ORAL | Status: DC | PRN

## 2023-02-28 MED ORDER — LORAZEPAM 0.5 MG PO TABS: 0.5000 mg | ORAL_TABLET | Freq: Four times a day (QID) | ORAL | Status: DC | PRN

## 2023-02-28 MED ORDER — HYDRALAZINE HCL 50 MG PO TABS
100.0000 mg | ORAL_TABLET | Freq: Three times a day (TID) | ORAL | Status: DC
  Administered 2023-02-28: 100 mg via ORAL
  Filled 2023-02-28: qty 2

## 2023-02-28 MED ORDER — LORAZEPAM 1 MG PO TABS
1.0000 mg | ORAL_TABLET | Freq: Four times a day (QID) | ORAL | Status: DC | PRN
  Administered 2023-02-28 (×2): 1 mg via ORAL
  Filled 2023-02-28 (×2): qty 1

## 2023-02-28 MED ORDER — LOPERAMIDE HCL 2 MG PO CAPS
2.0000 mg | ORAL_CAPSULE | ORAL | Status: DC | PRN
  Administered 2023-02-28: 2 mg via ORAL
  Filled 2023-02-28 (×2): qty 1

## 2023-02-28 MED ORDER — SODIUM CHLORIDE 0.9 % IV SOLN: INTRAVENOUS | Status: DC

## 2023-02-28 MED ORDER — HYDROCHLOROTHIAZIDE 25 MG PO TABS
25.0000 mg | ORAL_TABLET | Freq: Every day | ORAL | Status: DC
  Administered 2023-02-28 – 2023-03-01 (×2): 25 mg via ORAL
  Filled 2023-02-28 (×2): qty 1

## 2023-02-28 MED ORDER — POTASSIUM CHLORIDE CRYS ER 20 MEQ PO TBCR
20.0000 meq | EXTENDED_RELEASE_TABLET | Freq: Every day | ORAL | Status: DC
  Administered 2023-02-28 – 2023-03-01 (×2): 20 meq via ORAL
  Filled 2023-02-28 (×2): qty 1

## 2023-02-28 MED ORDER — NAPROXEN 250 MG PO TABS
500.0000 mg | ORAL_TABLET | Freq: Two times a day (BID) | ORAL | Status: DC | PRN
  Administered 2023-02-28: 500 mg via ORAL
  Filled 2023-02-28 (×2): qty 2

## 2023-02-28 MED ORDER — LOSARTAN POTASSIUM 50 MG PO TABS
100.0000 mg | ORAL_TABLET | Freq: Every day | ORAL | Status: DC
  Administered 2023-02-28 – 2023-03-01 (×2): 100 mg via ORAL
  Filled 2023-02-28 (×3): qty 2

## 2023-02-28 MED ORDER — HYDROXYZINE HCL 25 MG PO TABS
25.0000 mg | ORAL_TABLET | Freq: Four times a day (QID) | ORAL | Status: DC | PRN
  Administered 2023-02-28 (×2): 25 mg via ORAL
  Filled 2023-02-28 (×3): qty 1

## 2023-02-28 NOTE — Progress Notes (Signed)
PROGRESS NOTE Gerald Mills  OEU:[id number] DOB: Jun 03, 1981 DOA: 02/25/2023 PCP: Pcp, No  Brief Narrative/Hospital Course: 42yo M with PMH of HTN, mood disorder, osteoarthritis, noncompliance, tobacco use disorder and opiate use disorder sent to ED from detox due to bradycardia to 40s. Patient had nausea, vomiting and dehydration for about 2 days In ED found to have acute renal failure,sinus bradycardia, electrolyte imbalance, drug screen showed opiates positive, mild rhabdomyolysis CK1 524, imaging of the right and left knees osteoarthritis.He has been detoxing from pain meds for 2 days PTA. Patient was admitted for further management    Subjective: Seen and examined this morning  He is alert awake oriented but appears sweaty mildly anxious having diarrhea nausea  Reports he vomited  Assessment and Plan: Principal Problem:   AKI (acute kidney injury) Active Problems:   Current smoker   Nausea and vomiting   Hypokalemia   Opioid use disorder, moderate, dependence   Adjustment disorder with depressed mood   Sinus bradycardia: Likely in the setting of nausea vomiting, electrolyte imbalance.  Stabilized and improvedTSH slightly 0.3-but normal free T4.  Monitor  Chronic opiate use with withdrawals Intractable nausea and vomiting, with diarrhea, diaphoresis: reports using oxycodone not able to quantify-he did tell me he takes like 10 mg oxycodone 1 to 3 tablets but not forthcoming.  Psych was consulted.  Continue Lexapro Suspect opioid withdrawal > discussed about Suboxone/methadone, but he is not interested in it and refused.  Agreeable to try other management, added Ativan, antiemetics, Imodium, robaxin, atarax prn.Gentle IV fluid hydration and monitor electrolytes.  If heart rate remains stable could consider clonidine-urinalysis borderline heart rate and heart rate of 40s on presentation will will hold off on beta-blocker.     Acute kidney injury: Prerenal dehydration,+/- NSAID use in  the setting of nausea vomiting > resolved  Recent Labs    02/25/23 1735 02/25/23 2128 02/26/23 0944 02/27/23 0100 02/28/23 0623  BUN 35* 34* 34* 21* 11  CREATININE 2.67* 2.43* 1.76* 1.39* 1.13     Mild rhabdomyolysis -likely from withdrawals continue hydration  Recent Labs  Lab 02/26/23 0944 02/27/23 0100  CKTOTAL 1,524* 741*     Electrolyte imbalance: Hypokalemia/hypophosphatemia: Being replaced again aggressively and monitor  Recent Labs  Lab 02/25/23 1735 02/25/23 2128 02/26/23 0944 02/27/23 0100 02/27/23 2011 02/28/23 0623  K 3.5 3.1* 3.1* 3.3*  --  3.1*  CALCIUM 9.3 9.5 8.6* 8.5*  --  9.1  MG 2.0 2.0 1.8 1.7  --   --   PHOS  --   --   --  1.4* 1.3* 1.9*   No results found for: "PTH"    Essential hypertension BP poorly controlled, patient back on amlodipine, Resumed increased dose of losartan/HCTZ- on hydralazine as well- cont w/ holding parameters.   Hyperbilirubinemia resolved  Tobacco use disorder continue continue with PCP need TOC consulted  Class I Obesity:Patient's Body mass index is 31.74 kg/m. : Will benefit with PCP follow-up, weight loss  healthy lifestyle and outpatient sleep evaluation.   DVT prophylaxis: enoxaparin (LOVENOX) injection 40 mg Start: 02/27/23 1630 Code Status:   Code Status: Full Code Family Communication: plan of care discussed with patient  at bedside. Patient status is:  inpatient  because of aki Level of care: Telemetry Medical   Dispo: The patient is from: home            Anticipated disposition: TBD Objective: Vitals last 24 hrs: Vitals:   02/28/23 0030 02/28/23 0621 02/28/23 0750 02/28/23 0852  BP: Marland Kitchen)  196/127 (!) 193/124 (!) 183/111 (!) 152/92  Pulse: 66 64 69 94  Resp: 18 18  18   Temp: 99.3 F (37.4 C) 99.2 F (37.3 C)  98.8 F (37.1 C)  TempSrc: Oral Oral    SpO2: 100% 99%  97%  Weight:      Height:       Weight change:   Physical Examination: General exam: AAox3, weak,older appearing HEENT:Oral  mucosa moist, Ear/Nose WNL grossly, dentition normal. Respiratory system:Bilaterally clear BS, no use of accessory muscle Cardiovascular system:S1 & S2 +, regular rate. Gastrointestinal system:Abdomen soft, NT,ND,BS+. Nervous System:Alert, awake, moving extremities and grossly nonfocal, mydriasis+. Extremities:LE ankle edema neg, lower extremities warm Skin:No rashes,no icterus. Sweating+ WUJ:WJXBJY muscle bulk,tone, power   Medications reviewed:  Scheduled Meds:  amLODipine  10 mg Oral Daily   diclofenac Sodium  2 g Topical QID   enoxaparin (LOVENOX) injection  40 mg Subcutaneous Q24H   escitalopram  10 mg Oral Daily   hydrALAZINE  50 mg Oral Q8H   hydrochlorothiazide  25 mg Oral Daily   losartan  100 mg Oral Daily   nicotine  14 mg Transdermal Daily   phosphorus  500 mg Oral BID   Continuous Infusions:  sodium chloride 75 mL/hr at 02/28/23 1006   potassium PHOSPHATE IVPB (in mmol) 30 mmol (02/28/23 1012)     Diet Order             Diet Heart Room service appropriate? Yes; Fluid consistency: Thin  Diet effective now                   Intake/Output Summary (Last 24 hours) at 02/28/2023 1106 Last data filed at 02/28/2023 7829 Gross per 24 hour  Intake 1585.91 ml  Output 0 ml  Net 1585.91 ml    Net IO Since Admission: 2,573.12 mL [02/28/23 1106]  Wt Readings from Last 3 Encounters:  02/26/23 115.2 kg  08/12/21 115.4 kg  03/16/21 119.9 kg     Unresulted Labs (From admission, onward)     Start     Ordered   02/28/23 0500  Basic metabolic panel  Daily,   R      02/27/23 0952          Data Reviewed: I have personally reviewed following labs and imaging studies CBC: Recent Labs  Lab 02/25/23 1735 02/25/23 2128  WBC 8.7 8.7  NEUTROABS 6.1 5.9  HGB 13.0 13.6  HCT 40.4 42.8  MCV 92.0 92.2  PLT 195 215    Basic Metabolic Panel: Recent Labs  Lab 02/25/23 1735 02/25/23 2128 02/26/23 0944 02/27/23 0100 02/27/23 2011 02/28/23 0623  NA 138 135 136  137  --  136  K 3.5 3.1* 3.1* 3.3*  --  3.1*  CL 97* 96* 100 102  --  97*  CO2 28 27 24 27   --  26  GLUCOSE 136* 113* 205* 185*  --  135*  BUN 35* 34* 34* 21*  --  11  CREATININE 2.67* 2.43* 1.76* 1.39*  --  1.13  CALCIUM 9.3 9.5 8.6* 8.5*  --  9.1  MG 2.0 2.0 1.8 1.7  --   --   PHOS  --   --   --  1.4* 1.3* 1.9*    GFR: Estimated Creatinine Clearance: 117.8 mL/min (by C-G formula based on SCr of 1.13 mg/dL). Liver Function Tests: Recent Labs  Lab 02/25/23 1735 02/25/23 2128 02/26/23 0944 02/27/23 0100  AST 59* 60* 42* 32  ALT 32  36 32 31  ALKPHOS 76 76 75 62  BILITOT 1.9* 1.7* 1.0 1.2  PROT 7.2 7.8 5.8* 6.1*  ALBUMIN 4.8 4.8 3.4* 3.4*    Recent Labs  Lab 02/26/23 0944 02/27/23 0100  LIPASE 31 32    Recent Labs    02/25/23 1645 02/26/23 0944  TSH 0.332*  --   FREET4  --  1.06    Sepsis Labs: No results for input(s): "PROCALCITON", "LATICACIDVEN" in the last 168 hours.  Recent Results (from the past 240 hour(s))  SARS Coronavirus 2 by RT PCR (hospital order, performed in [facility] hospital lab) *cepheid single result test* Anterior Nasal Swab     Status: None   Collection Time: 02/25/23  5:35 PM   Specimen: Anterior Nasal Swab  Result Value Ref Range Status   SARS Coronavirus 2 by RT PCR NEGATIVE NEGATIVE Final    Comment: Performed at [facility], [address], [place], [place] [address]    Antimicrobials: Anti-infectives (From admission, onward)    Start     Dose/Rate Route Frequency Ordered Stop   02/26/23 0130  metroNIDAZOLE (FLAGYL) tablet 500 mg        500 mg Oral  Once 02/26/23 0122 02/26/23 0239      Culture/Microbiology    Component Value Date/Time   SDES ABSCESS GROIN 08/17/2007 1430   SPECREQUEST NONE 08/17/2007 1430   CULT  08/17/2007 1430    MULTIPLE ORGANISMS PRESENT, NONE PREDOMINANT Note: NO STAPHYLOCOCCUS AUREUS ISOLATED NO GROUP A STREP (S.PYOGENES) ISOLATED   REPTSTATUS 08/20/2007 FINAL 08/17/2007 1430   Radiology  Studies: No results found.   LOS: 0 days   Lanae Boast, MD [facility]  02/28/2023, 11:06 AM

## 2023-02-28 NOTE — Progress Notes (Signed)
The first attempt for PIV access was unsuccessful. Patient wanted to go to bathroom and need time for 30 min. Came back for placement of PIV access. Patient refused PIV access. Informed patient's RN regarding this matter. Patient's RN will talk to MD and will put in the new consult if patient is agreeable to put int PIV access. HS [employer]

## 2023-03-01 ENCOUNTER — Inpatient Hospital Stay (HOSPITAL_COMMUNITY)
Admission: EM | Admit: 2023-03-01 | Discharge: 2023-03-05 | DRG: 683 | Disposition: A | Discharge status: Home / Self Care | Payer: Self-pay | Attending: Family Medicine | Admitting: Family Medicine

## 2023-03-01 ENCOUNTER — Other Ambulatory Visit (HOSPITAL_COMMUNITY): Payer: Self-pay

## 2023-03-01 DIAGNOSIS — N529 Male erectile dysfunction, unspecified: Secondary | ICD-10-CM | POA: Diagnosis present

## 2023-03-01 DIAGNOSIS — F32A Depression, unspecified: Secondary | ICD-10-CM | POA: Diagnosis present

## 2023-03-01 DIAGNOSIS — M6282 Rhabdomyolysis: Secondary | ICD-10-CM | POA: Diagnosis present

## 2023-03-01 DIAGNOSIS — Z7151 Drug abuse counseling and surveillance of drug abuser: Secondary | ICD-10-CM

## 2023-03-01 DIAGNOSIS — N179 Acute kidney failure, unspecified: Principal | ICD-10-CM | POA: Diagnosis present

## 2023-03-01 DIAGNOSIS — F39 Unspecified mood [affective] disorder: Secondary | ICD-10-CM | POA: Diagnosis present

## 2023-03-01 DIAGNOSIS — E860 Dehydration: Secondary | ICD-10-CM | POA: Diagnosis present

## 2023-03-01 DIAGNOSIS — F17210 Nicotine dependence, cigarettes, uncomplicated: Secondary | ICD-10-CM | POA: Diagnosis present

## 2023-03-01 DIAGNOSIS — Z79899 Other long term (current) drug therapy: Secondary | ICD-10-CM

## 2023-03-01 DIAGNOSIS — F1910 Other psychoactive substance abuse, uncomplicated: Secondary | ICD-10-CM

## 2023-03-01 DIAGNOSIS — E876 Hypokalemia: Secondary | ICD-10-CM | POA: Diagnosis present

## 2023-03-01 DIAGNOSIS — Z833 Family history of diabetes mellitus: Secondary | ICD-10-CM

## 2023-03-01 DIAGNOSIS — R748 Abnormal levels of other serum enzymes: Secondary | ICD-10-CM | POA: Diagnosis present

## 2023-03-01 DIAGNOSIS — R7401 Elevation of levels of liver transaminase levels: Secondary | ICD-10-CM | POA: Diagnosis present

## 2023-03-01 DIAGNOSIS — Z046 Encounter for general psychiatric examination, requested by authority: Secondary | ICD-10-CM

## 2023-03-01 DIAGNOSIS — F1123 Opioid dependence with withdrawal: Secondary | ICD-10-CM | POA: Diagnosis present

## 2023-03-01 DIAGNOSIS — M1990 Unspecified osteoarthritis, unspecified site: Secondary | ICD-10-CM | POA: Diagnosis present

## 2023-03-01 DIAGNOSIS — R001 Bradycardia, unspecified: Secondary | ICD-10-CM | POA: Diagnosis present

## 2023-03-01 DIAGNOSIS — I10 Essential (primary) hypertension: Secondary | ICD-10-CM | POA: Diagnosis present

## 2023-03-01 DIAGNOSIS — Z635 Disruption of family by separation and divorce: Secondary | ICD-10-CM

## 2023-03-01 DIAGNOSIS — F419 Anxiety disorder, unspecified: Secondary | ICD-10-CM | POA: Diagnosis present

## 2023-03-01 DIAGNOSIS — Z8249 Family history of ischemic heart disease and other diseases of the circulatory system: Secondary | ICD-10-CM

## 2023-03-01 DIAGNOSIS — Z5320 Procedure and treatment not carried out because of patient's decision for unspecified reasons: Secondary | ICD-10-CM | POA: Diagnosis present

## 2023-03-01 LAB — CBC WITH DIFFERENTIAL/PLATELET
Abs Immature Granulocytes: 0.02 10*3/uL (ref 0.00–0.07)
Basophils Absolute: 0 10*3/uL (ref 0.0–0.1)
Basophils Relative: 0 %
Eosinophils Absolute: 0 10*3/uL (ref 0.0–0.5)
Eosinophils Relative: 0 %
HCT: 46 % (ref 39.0–52.0)
Hemoglobin: 15 g/dL (ref 13.0–17.0)
Immature Granulocytes: 0 %
Lymphocytes Relative: 6 %
Lymphs Abs: 0.6 10*3/uL — ABNORMAL LOW (ref 0.7–4.0)
MCH: 29.8 pg (ref 26.0–34.0)
MCHC: 32.6 g/dL (ref 30.0–36.0)
MCV: 91.5 fL (ref 80.0–100.0)
Monocytes Absolute: 1.3 10*3/uL — ABNORMAL HIGH (ref 0.1–1.0)
Monocytes Relative: 13 %
Neutro Abs: 8.3 10*3/uL — ABNORMAL HIGH (ref 1.7–7.7)
Neutrophils Relative %: 81 %
Platelets: 221 10*3/uL (ref 150–400)
RBC: 5.03 MIL/uL (ref 4.22–5.81)
RDW: 14.5 % (ref 11.5–15.5)
WBC: 10.3 10*3/uL (ref 4.0–10.5)
nRBC: 0 % (ref 0.0–0.2)

## 2023-03-01 LAB — BASIC METABOLIC PANEL
Anion gap: 9 (ref 5–15)
BUN: 13 mg/dL (ref 6–20)
CO2: 27 mmol/L (ref 22–32)
Calcium: 8.7 mg/dL — ABNORMAL LOW (ref 8.9–10.3)
Chloride: 101 mmol/L (ref 98–111)
Creatinine, Ser: 1.12 mg/dL (ref 0.61–1.24)
GFR, Estimated: >60 mL/min (ref >60)
Glucose, Bld: 116 mg/dL — ABNORMAL HIGH (ref 70–99)
Potassium: 3.6 mmol/L (ref 3.5–5.1)
Sodium: 137 mmol/L (ref 135–145)

## 2023-03-01 LAB — COMPREHENSIVE METABOLIC PANEL
ALT: 73 U/L — ABNORMAL HIGH (ref 0–44)
AST: 203 U/L — ABNORMAL HIGH (ref 15–41)
Albumin: 4.1 g/dL (ref 3.5–5.0)
Alkaline Phosphatase: 73 U/L (ref 38–126)
Anion gap: 13 (ref 5–15)
BUN: 24 mg/dL — ABNORMAL HIGH (ref 6–20)
CO2: 23 mmol/L (ref 22–32)
Calcium: 8.8 mg/dL — ABNORMAL LOW (ref 8.9–10.3)
Chloride: 100 mmol/L (ref 98–111)
Creatinine, Ser: 2.69 mg/dL — ABNORMAL HIGH (ref 0.61–1.24)
GFR, Estimated: 30 mL/min — ABNORMAL LOW (ref >60)
Glucose, Bld: 139 mg/dL — ABNORMAL HIGH (ref 70–99)
Potassium: 3.3 mmol/L — ABNORMAL LOW (ref 3.5–5.1)
Sodium: 136 mmol/L (ref 135–145)
Total Bilirubin: 1.1 mg/dL (ref 0.3–1.2)
Total Protein: 8.2 g/dL — ABNORMAL HIGH (ref 6.5–8.1)

## 2023-03-01 LAB — MAGNESIUM: Magnesium: 1.8 mg/dL (ref 1.7–2.4)

## 2023-03-01 LAB — RAPID URINE DRUG SCREEN, HOSP PERFORMED
Amphetamines: NOT DETECTED
Barbiturates: NOT DETECTED
Benzodiazepines: POSITIVE — AB
Cocaine: NOT DETECTED
Opiates: POSITIVE — AB
Tetrahydrocannabinol: NOT DETECTED

## 2023-03-01 LAB — CK: Total CK: 12457 U/L — ABNORMAL HIGH (ref 49–397)

## 2023-03-01 LAB — ETHANOL: Alcohol, Ethyl (B): <10 mg/dL (ref <10)

## 2023-03-01 LAB — PHOSPHORUS: Phosphorus: 2.6 mg/dL (ref 2.5–4.6)

## 2023-03-01 MED ORDER — LACTATED RINGERS IV BOLUS: 1000.0000 mL | Freq: Once | INTRAVENOUS | Status: DC

## 2023-03-01 MED ORDER — LACTATED RINGERS IV SOLN: INTRAVENOUS | Status: DC

## 2023-03-01 MED ORDER — LACTATED RINGERS IV BOLUS
2000.0000 mL | Freq: Once | INTRAVENOUS | Status: AC
  Administered 2023-03-02: 2000 mL via INTRAVENOUS

## 2023-03-01 MED ORDER — HYDROXYZINE HCL 25 MG PO TABS
25.0000 mg | ORAL_TABLET | Freq: Four times a day (QID) | ORAL | 0 refills | Status: AC | PRN
  Filled 2023-03-01: qty 15, 4d supply, fill #0

## 2023-03-01 MED ORDER — LOSARTAN POTASSIUM-HCTZ 100-25 MG PO TABS
1.0000 | ORAL_TABLET | Freq: Every day | ORAL | 0 refills | Status: DC
  Filled 2023-03-01: qty 30, 30d supply, fill #0

## 2023-03-01 MED ORDER — AMLODIPINE BESYLATE 10 MG PO TABS
10.0000 mg | ORAL_TABLET | Freq: Every day | ORAL | 0 refills | Status: DC
  Filled 2023-03-01: qty 30, 30d supply, fill #0

## 2023-03-01 NOTE — H&P (Incomplete)
History and Physical    Patient: Gerald Mills WUJ:[id number] DOB: 09-May-1981 DOA: 03/01/2023 DOS: the patient was seen and examined on 03/02/2023 PCP: Pcp, No  Patient coming from: Home  Chief Complaint:  Chief Complaint  Patient presents with   Psychiatric Evaluation   HPI: Gerald Mills is a 42 y.o. male with medical history significant of HTN, mood disorder, tobacco and opiate abuse who presents initially as IVC by mother for agitation.   Patient was just discharged earlier today for acute renal failure, sinus bradycardia, electrolyte imbalance, mild rhabdomyolysis CK 1524 and opiate withdrawal. Findings though secondary to dehydration from several days of nausea and vomiting.   Reportedly brought in by police after argument and agitation with his mother. He tells me he went home after discharge and took the medication he was prescribed and then had diarrhea. No nausea, vomiting. No abdominal pain. Denies any alcohol or any drugs since Friday.  He does not want to be stuck anymore and refusing any IV fluids. States "does not see the need for it."   In ED, hypertensive to 160/119.  CBC shows more concentrated Hgb.   Hypokalemia of 3.3. In renal failure with worsening creatinine of 2.69 and GFR <30. Gap of 13.  Hypocalcemia of 8.8. CK now at [address].   AST elevated to 203, ALT of 73.   ETOH <10. UDS positive for opiates and benzodiazepines.   Review of Systems: As mentioned in the history of present illness. All other systems reviewed and are negative. Past Medical History:  Diagnosis Date   Hypertension     Social History:  reports that he has been smoking cigarettes. He has a 12.00 pack-year smoking history. He has never used smokeless tobacco. He reports current alcohol use. He reports that he does not use drugs.  No Known Allergies  Family History  Problem Relation Age of Onset   Diabetes Mother    Hypertension Mother    Healthy Father    Hypertension Father      Prior to Admission medications   Medication Sig Start Date End Date Taking? Authorizing Provider  amLODipine (NORVASC) 10 MG tablet Take 1 tablet (10 mg total) by mouth daily. 03/02/23 04/01/23  Lanae Boast, MD  escitalopram (LEXAPRO) 10 MG tablet Take 1 tablet (10 mg total) by mouth daily. Patient not taking: Reported on 02/25/2023 03/16/21   Mirian Mo, MD  hydrOXYzine (ATARAX) 25 MG tablet Take 1 tablet (25 mg total) by mouth every 6 (six) hours as needed for up to 15 days for anxiety. 03/01/23 03/16/23  Lanae Boast, MD  losartan-hydrochlorothiazide (HYZAAR) 100-25 MG tablet Take 1 tablet by mouth daily. 03/01/23 03/31/23  Lanae Boast, MD  furosemide (LASIX) 20 MG tablet Take 1 tablet (20 mg total) by mouth daily for 7 days. 09/25/20 03/03/21  Bing Neighbors, NP  hydrochlorothiazide (HYDRODIURIL) 25 MG tablet Take 1 tablet (25 mg total) by mouth daily. 04/04/17 08/25/19  Coralyn Mark, NP  lisinopril-hydrochlorothiazide (ZESTORETIC) 10-12.5 MG tablet Take 1 tablet by mouth daily. 05/21/19 08/25/19  Mickie Bail, NP    Physical Exam: Vitals:   03/01/23 1908  BP: (!) 162/119  Pulse: (!) 103  Resp: 17  Temp: (!) 97.4 F (36.3 C)  TempSrc: Oral  SpO2: 100%  Weight: 115.2 kg  Height:  (1.905 m)   Constitutional: NAD, calm, comfortable, middle age male laying in bed Eyes: lids and conjunctivae normal ENMT: Mucous membranes are moist.  Neck: normal, supple Respiratory: clear to  auscultation bilaterally, no wheezing, no crackles. Normal respiratory effort. No accessory muscle use.  Cardiovascular: Regular rate and rhythm, no murmurs / rubs / gallops.   Abdomen:soft, non-distended, no tenderness, no masses palpated.  Musculoskeletal: no clubbing / cyanosis. No joint deformity upper and lower extremities.  Normal muscle tone.  Skin: no rashes, lesions, ulcers.  Neurologic: CN 2-12 grossly intact. Psychiatric:  Alert and oriented x 3. Normal cooperative mood. Data Reviewed:  See  HPI  Assessment and Plan: * Acute renal failure -worsening creatinine of 2.69 and GFR <30 secondary to rhabdomyolysis and possible drug use although pt denies. UDS is positive for opioids and benzodiazepines.  -Aggressive IV fluid resuscitation need. However he is refusing placement of IV access at this time. I have discussed repeatedly potentials of him going into complete renal failure without IV fluids. He reports understanding just wants to take fluids orally.    Rhabdomyolysis CK >12000 likely secondary to drug use and agitation before admission -pt refusing IV fluid as stated above   Hypocalcemia -replenish with oral calcium supplement  Polysubstance abuse -UDS positive for benzo and opioids although he denies drug use in the past 4 days -keep on CIWA protocol. Pt refusing other interventions at this time.   Hypokalemia -potassium of 3.3 on presentation -will give oral potassium supplementation  Mood disorder -pt under IVC by mother for agitation. Currently calm and cooperative although may need sitter if he becomes agitated.  -psych was consulted in earlier admission. -continue Lexapro  Hypertension -continue amlodipine -hold losartan-HCTZ due to acute renal failure      Advance Care Planning: Full  Consults: none  Family Communication: none at bedside  Severity of Illness: The appropriate patient status for this patient is INPATIENT. Inpatient status is judged to be reasonable and necessary in order to provide the required intensity of service to ensure the patient's safety. The patient's presenting symptoms, physical exam findings, and initial radiographic and laboratory data in the context of their chronic comorbidities is felt to place them at high risk for further clinical deterioration. Furthermore, it is not anticipated that the patient will be medically stable for discharge from the hospital within 2 midnights of admission.   * I certify that at the point of  admission it is my clinical judgment that the patient will require inpatient hospital care spanning beyond 2 midnights from the point of admission due to high intensity of service, high risk for further deterioration and high frequency of surveillance required.*  Author: Anselm Jungling, DO 03/02/2023 12:34 AM  For on call review [email or web address].

## 2023-03-01 NOTE — Discharge Summary (Signed)
Physician Discharge Summary  Gerald Mills:[id number] DOB: Feb 25, 1981 DOA: 02/25/2023  PCP: Pcp, No  Admit date: 02/25/2023 Discharge date: 03/01/2023 Recommendations for Outpatient Follow-up:  Follow up with PCP in 1 weeks-call for appointment Please obtain BMP/CBC in one week  Discharge Dispo: Home Discharge Condition: Stable Code Status:   Code Status: Full Code Diet recommendation:  Diet Order             Diet Heart Room service appropriate? Yes; Fluid consistency: Thin  Diet effective now                    Brief/Interim Summary: 42yo M with PMH of HTN, mood disorder, osteoarthritis, noncompliance, tobacco use disorder and opiate use disorder sent to ED from detox due to bradycardia to 40s. Patient had nausea, vomiting and dehydration for about 2 days In ED found to have acute renal failure,sinus bradycardia, electrolyte imbalance, drug screen showed opiates positive, mild rhabdomyolysis CK1 524, imaging of the right and left knees osteoarthritis.He has been detoxing from pain meds for 2 days PTA. Patient was admitted for further management Patient was managed with IV fluid hydration symptomatic management.  He is going into opiate withdrawals but he refused to be on methadone or Suboxone at this point and managed symptomatically. AKI has resolved, he had significant blood imbalance and improved, overall doing much better BP is well-controlled and medication has been adjusted At this time is tolerating diet no more diarrhea anxiety sweating.  He feels improved and ready for discharge.  Provided with PCP follow-up, and also prescription   Discharge Diagnoses:  Principal Problem:   AKI (acute kidney injury) Active Problems:   Current smoker   Nausea and vomiting   Hypokalemia   Opioid use disorder, moderate, dependence   Adjustment disorder with depressed mood  Sinus bradycardia: Likely in the setting of nausea vomiting, electrolyte imbalance.  Stabilized and  improvedTSH slightly 0.3-but normal free T4.  Monitor  Chronic opiate use with withdrawals Intractable nausea and vomiting, with diarrhea, diaphoresis: reports using oxycodone not able to quantify-he did tell me he takes like 10 mg oxycodone 1 to 3 tablets but not forthcoming.  Psych was consulted.  Continue Lexapro Suspect opioid withdrawal > discussed about Suboxone/methadone, but he is not interested in it and refused.  Agreeable to try other management, symptomatically management d Ativan, antiemetics, Imodium, robaxin, atarax prn IV fluid hydration and he is clinically improved at this time.  Discussed extensively for him to quit opiates.  TOC to provide outpatient resources.   Acute kidney injury: Prerenal dehydration,+/- NSAID use in the setting of nausea vomiting > resolved  Recent Labs    02/25/23 1735 02/25/23 2128 02/26/23 0944 02/27/23 0100 02/28/23 0623 03/01/23 0616  BUN 35* 34* 34* 21* 11 13  CREATININE 2.67* 2.43* 1.76* 1.39* 1.13 1.12    Mild rhabdomyolysis -likely from withdrawals managed with IV fluids.   Recent Labs  Lab 02/26/23 0944 02/27/23 0100  CKTOTAL 1,524* 741*    Electrolyte imbalance: Hypokalemia/hypophosphatemia: Being replaced and has resolved  Recent Labs  Lab 02/25/23 1735 02/25/23 2128 02/26/23 0944 02/27/23 0100 02/27/23 2011 02/28/23 0623 02/28/23 1306 03/01/23 0616  K 3.5 3.1* 3.1* 3.3*  --  3.1* 3.1* 3.6  CALCIUM 9.3 9.5 8.6* 8.5*  --  9.1  --  8.7*  MG 2.0 2.0 1.8 1.7  --   --   --  1.8  PHOS  --   --   --  1.4* 1.3* 1.9*  --  2.6  No results found for: "PTH"    Essential hypertension BP poorly controlled> 90 stabilized continue amlodipine losartan/HCTZ prescription provided follow-up with PCP  Hyperbilirubinemia resolved  Tobacco use disorder continue continue with PCP need TOC consulted  Class I Obesity:Patient's Body mass index is 31.74 kg/m. : Will benefit with PCP follow-up, weight loss  healthy lifestyle and outpatient  sleep evaluation.   Consults: none Subjective: Alert awake oriented tolerating diet he feels ready for discharge this morning BP stable  Discharge Exam: Vitals:   02/28/23 1932 03/01/23 0739  BP: (!) 157/87 (!) 148/81  Pulse: 64 69  Resp: 17 18  Temp: 98.8 F (37.1 C) 98.7 F (37.1 C)  SpO2: 98% 97%   General: Pt is alert, awake, not in acute distress Cardiovascular: RRR, S1/S2 +, no rubs, no gallops Respiratory: CTA bilaterally, no wheezing, no rhonchi Abdominal: Soft, NT, ND, bowel sounds + Extremities: no edema, no cyanosis  Discharge Instructions  Discharge Instructions     Discharge instructions   Complete by: As directed    Avoid opiates oxycodone  Please call call MD or return to ER for similar or worsening recurring problem that brought you to hospital or if any fever,nausea/vomiting,abdominal pain, uncontrolled pain, chest pain,  shortness of breath or any other alarming symptoms.  Please follow-up your doctor as instructed in a week time and call the office for appointment.  Please avoid alcohol, smoking, or any other illicit substance and maintain healthy habits including taking your regular medications as prescribed.  You were cared for by a hospitalist during your hospital stay. If you have any questions about your discharge medications or the care you received while you were in the hospital after you are discharged, you can call the unit and ask to speak with the hospitalist on call if the hospitalist that took care of you is not available.  Once you are discharged, your primary care physician will handle any further medical issues. Please note that NO REFILLS for any discharge medications will be authorized once you are discharged, as it is imperative that you return to your primary care physician (or establish a relationship with a primary care physician if you do not have one) for your aftercare needs so that they can reassess your need for medications and  monitor your lab values   Increase activity slowly   Complete by: As directed       Allergies as of 03/01/2023   No Known Allergies      Medication List     STOP taking these medications    losartan-hydrochlorothiazide 50-12.5 MG tablet Commonly known as: Hyzaar Replaced by: losartan-hydrochlorothiazide 100-25 MG tablet       TAKE these medications    acetaminophen 500 MG tablet Commonly known as: TYLENOL Take 500 mg by mouth every 6 (six) hours as needed for moderate pain.   amLODipine 10 MG tablet Commonly known as: NORVASC Take 1 tablet (10 mg total) by mouth daily. Start taking on: March 02, 2023   escitalopram 10 MG tablet Commonly known as: Lexapro Take 1 tablet (10 mg total) by mouth daily.   hydrOXYzine 25 MG tablet Commonly known as: ATARAX Take 1 tablet (25 mg total) by mouth every 6 (six) hours as needed for up to 15 days for anxiety.   losartan-hydrochlorothiazide 100-25 MG tablet Commonly known as: Hyzaar Take 1 tablet by mouth daily. Replaces: losartan-hydrochlorothiazide 50-12.5 MG tablet        Follow-up Information     [facility] Follow up in 1 week(s).   Contact information: [address] [place] [address] [phone]               No Known Allergies  The results of significant diagnostics from this hospitalization (including imaging, microbiology, ancillary and laboratory) are listed below for reference.    Microbiology: Recent Results (from the past 240 hour(s))  SARS Coronavirus 2 by RT PCR (hospital order, performed in [facility] hospital lab) *cepheid single result test* Anterior Nasal Swab     Status: None   Collection Time: 02/25/23  5:35 PM   Specimen: Anterior Nasal Swab  Result Value Ref Range Status   SARS Coronavirus 2 by RT PCR NEGATIVE NEGATIVE Final    Comment: Performed at [facility], [address], [place], [place] [address]     Procedures/Studies: DG Knee 1-2 Views Right  Result Date: 02/26/2023 CLINICAL DATA:  42 year old male with history of bilateral knee pain. EXAM: RIGHT KNEE - 1-2 VIEW COMPARISON:  No priors. FINDINGS: Two views of the right knee demonstrate no acute displaced fracture, subluxation or dislocation. There is multifocal joint space narrowing and subchondral sclerosis, most evident in the medial and patellofemoral compartments, indicative of mild osteoarthritis. IMPRESSION: 1. No acute radiographic abnormality of the right knee. 2. Mild osteoarthritis, as above. Electronically Signed   By: Trudie Reed M.D.   On: 02/26/2023 06:30   DG Knee 1-2 Views Left  Result Date: 02/26/2023 CLINICAL DATA:  42 year old male with history of bilateral knee pain. EXAM: LEFT KNEE - 1-2 VIEW COMPARISON:  No priors. FINDINGS: Two views of the left knee demonstrate no acute displaced fracture, subluxation or dislocation. There is joint space narrowing, subchondral sclerosis and osteophyte formation noted, most evident in the medial compartment. IMPRESSION: 1. No acute radiographic abnormality of the left knee. 2. 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 Electronically Signed   By: Trudie Reed M.D.   On: 02/26/2023 06:29    Labs: BNP (last 3 results) No results for input(s): "BNP" in the last 8760 hours. Basic Metabolic Panel: Recent Labs  Lab 02/25/23 1735 02/25/23 2128 02/26/23 0944 02/27/23 0100 02/27/23 2011 02/28/23 0623 02/28/23 1306 03/01/23 0616  NA 138 135 136 137  --  136  --  137  K 3.5 3.1* 3.1* 3.3*  --  3.1* 3.1* 3.6  CL 97* 96* 100 102  --  97*  --  101  CO2 28 27 24 27   --  26  --  27  GLUCOSE 136* 113* 205* 185*  --  135*  --  116*  BUN 35* 34* 34* 21*  --  11  --  13  CREATININE 2.67* 2.43* 1.76* 1.39*  --  1.13  --  1.12  CALCIUM 9.3 9.5 8.6* 8.5*  --  9.1  --  8.7*  MG 2.0 2.0 1.8 1.7  --   --   --  1.8  PHOS  --   --   --  1.4* 1.3* 1.9*  --  2.6   Liver Function  Tests: Recent Labs  Lab 02/25/23 1735 02/25/23 2128 02/26/23 0944 02/27/23 0100  AST 59* 60* 42* 32  ALT 32 36 32 31  ALKPHOS 76 76 75 62  BILITOT 1.9* 1.7* 1.0 1.2  PROT 7.2 7.8 5.8* 6.1*  ALBUMIN 4.8 4.8 3.4* 3.4*   Recent Labs  Lab 02/26/23 0944 02/27/23 0100  LIPASE 31 32  No results for input(s): "AMMONIA" in the last 168 hours. CBC: Recent Labs  Lab 02/25/23 1735 02/25/23 2128  WBC 8.7 8.7  NEUTROABS 6.1 5.9  HGB 13.0 13.6  HCT 40.4 42.8  MCV 92.0 92.2  PLT 195 215   Cardiac Enzymes: Recent Labs  Lab 02/26/23 0944 02/27/23 0100  CKTOTAL 1,524* 741*   BNP: Invalid input(s): "POCBNP" CBG: No results for input(s): "GLUCAP" in the last 168 hours. D-Dimer No results for input(s): "DDIMER" in the last 72 hours. Hgb A1c No results for input(s): "HGBA1C" in the last 72 hours. Lipid Profile No results for input(s): "CHOL", "HDL", "LDLCALC", "TRIG", "CHOLHDL", "LDLDIRECT" in the last 72 hours. Thyroid function studies No results for input(s): "TSH", "T4TOTAL", "T3FREE", "THYROIDAB" in the last 72 hours.  Invalid input(s): "FREET3" Anemia work up No results for input(s): "VITAMINB12", "FOLATE", "FERRITIN", "TIBC", "IRON", "RETICCTPCT" in the last 72 hours. Urinalysis    Component Value Date/Time   LABSPEC 1.025 04/04/2017 1948   PHURINE 5.5 04/04/2017 1948   GLUCOSEU NEGATIVE 04/04/2017 1948   HGBUR NEGATIVE 04/04/2017 1948   BILIRUBINUR NEGATIVE 04/04/2017 1948   KETONESUR NEGATIVE 04/04/2017 1948   PROTEINUR 100 (A) 04/04/2017 1948   UROBILINOGEN 0.2 04/04/2017 1948   NITRITE NEGATIVE 04/04/2017 1948   LEUKOCYTESUR NEGATIVE 04/04/2017 1948   Sepsis Labs Recent Labs  Lab 02/25/23 1735 02/25/23 2128  WBC 8.7 8.7   Microbiology Recent Results (from the past 240 hour(s))  SARS Coronavirus 2 by RT PCR (hospital order, performed in [facility] hospital lab) *cepheid single result test* Anterior Nasal Swab     Status: None   Collection Time:  02/25/23  5:35 PM   Specimen: Anterior Nasal Swab  Result Value Ref Range Status   SARS Coronavirus 2 by RT PCR NEGATIVE NEGATIVE Final    Comment: Performed at [facility], [address], [place], [place] [address]   Time coordinating discharge: 25 minutes  SIGNED: Lanae Boast, MD  [facility] 03/01/2023, 11:17 AM  If 7PM-7AM, please contact night-coverage [email or web address]

## 2023-03-01 NOTE — TOC Transition Note (Signed)
Transition of Care [facility]) - CM/SW Discharge Note   Patient Details  Name: Gerald Mills MRN: [id number] Date of Birth: 1981/07/30  [facility] CM/SW Contact:  Tom-Johnson, Hershal Coria, RN Phone Number: 03/01/2023, 10:25 AM   Clinical Narrative:     Patient is scheduled for discharge today.  Readmission Prevention Assessment done.  New patient establishment, Hospital f/u and discharge instructions on AVS. Prescription sent to [facility] pharmacy and meds to be delivered to patient at bedside prior to discharge.  Mother to transport at discharge.  No further TOC needs noted.            Final next level of care: Home/Self Care Barriers to Discharge: Barriers Resolved   Patient Goals and CMS Choice CMS Medicare.gov Compare Post Acute Care list provided to:: Patient Choice offered to / list presented to : Patient  Discharge Placement                         Discharge Plan and Services Additional resources added to the After Visit Summary for                  DME Arranged: N/A DME Agency: NA       HH Arranged: NA HH Agency: NA        Social Determinants of Health (SDOH) Interventions SDOH Screenings   Food Insecurity: No Food Insecurity (02/26/2023)  Housing: Low Risk  (02/26/2023)  Transportation Needs: No Transportation Needs (02/26/2023)  Utilities: Not At Risk (02/26/2023)  Depression (PHQ2-9): High Risk (02/25/2023)  Tobacco Use: High Risk (02/25/2023)     Readmission Risk Interventions    03/01/2023   10:24 AM  Readmission Risk Prevention Plan  Transportation Screening Complete  PCP or Specialist Appt within 5-7 Days Complete  Home Care Screening Complete  Medication Review (RN CM) Referral to Pharmacy

## 2023-03-01 NOTE — ED Notes (Signed)
Pt dressed out into burgundy scrubs. Pt belongings placed in cabinet 9-12 hall c. Belongings consisted of shirt, blue scrub pants, grey hospital socks, and slides. Wanded by security.

## 2023-03-01 NOTE — ED Notes (Signed)
Pt is refusing IV insertion for fluids at this time. Pt is drinking water.

## 2023-03-01 NOTE — ED Triage Notes (Signed)
Pt bib [employer] with IVC papers. Patient has been acting erratic, tried to rip his moms door off the hinges. Pt calm and cooperative on arrival.

## 2023-03-01 NOTE — ED Provider Notes (Signed)
[place] EMERGENCY DEPARTMENT AT [facility] Provider Note   CSN: [id number] Arrival date & time: 03/01/23  1846     History  Chief Complaint  Patient presents with   Psychiatric Evaluation    Gerald Mills is a 42 y.o. male.  Patient here after being placed under IVC by his mother due to increased agitation.  Patient just been discharged from the hospital after medical admission for acute kidney injury.  Patient states that when he got home he felt fine but then got to argument with his mother.  Patient himself denies any SI or HI.  Denies been agitated.  States he is alert and oriented x 4 at this time.  Patient reportedly had been screaming and making loud noises at home.  According to police officer bedside patient has been calm and cooperative here.  Patient denies any complaints at this time       Home Medications Prior to Admission medications   Medication Sig Start Date End Date Taking? Authorizing Provider  acetaminophen (TYLENOL) 500 MG tablet Take 500 mg by mouth every 6 (six) hours as needed for moderate pain.    [provider]  amLODipine (NORVASC) 10 MG tablet Take 1 tablet (10 mg total) by mouth daily. 03/02/23 04/01/23  Lanae Boast, MD  escitalopram (LEXAPRO) 10 MG tablet Take 1 tablet (10 mg total) by mouth daily. Patient not taking: Reported on 02/25/2023 03/16/21   Mirian Mo, MD  hydrOXYzine (ATARAX) 25 MG tablet Take 1 tablet (25 mg total) by mouth every 6 (six) hours as needed for up to 15 days for anxiety. 03/01/23 03/16/23  Lanae Boast, MD  losartan-hydrochlorothiazide (HYZAAR) 100-25 MG tablet Take 1 tablet by mouth daily. 03/01/23 03/31/23  Lanae Boast, MD  furosemide (LASIX) 20 MG tablet Take 1 tablet (20 mg total) by mouth daily for 7 days. 09/25/20 03/03/21  Bing Neighbors, NP  hydrochlorothiazide (HYDRODIURIL) 25 MG tablet Take 1 tablet (25 mg total) by mouth daily. 04/04/17 08/25/19  Coralyn Mark, NP   lisinopril-hydrochlorothiazide (ZESTORETIC) 10-12.5 MG tablet Take 1 tablet by mouth daily. 05/21/19 08/25/19  Mickie Bail, NP      Allergies    Patient has no known allergies.    Review of Systems   Review of Systems  All other systems reviewed and are negative.   Physical Exam Updated Vital Signs BP (!) 162/119 (BP Location: Right Arm)   Pulse (!) 103   Temp (!) 97.4 F (36.3 C) (Oral) Comment: pt drinking ice water  Resp 17   Ht 1.905 m ( )   Wt 115.2 kg   SpO2 100%   BMI 31.74 kg/m  Physical Exam Vitals and nursing note reviewed.  Constitutional:      General: He is not in acute distress.    Appearance: Normal appearance. He is well-developed. He is not toxic-appearing.  HENT:     Head: Normocephalic and atraumatic.  Eyes:     General: Lids are normal.     Conjunctiva/sclera: Conjunctivae normal.     Pupils: Pupils are equal, round, and reactive to light.  Neck:     Thyroid: No thyroid mass.     Trachea: No tracheal deviation.  Cardiovascular:     Rate and Rhythm: Normal rate and regular rhythm.     Heart sounds: Normal heart sounds. No murmur heard.    No gallop.  Pulmonary:     Effort: Pulmonary effort is normal. No respiratory distress.  Breath sounds: Normal breath sounds. No stridor. No decreased breath sounds, wheezing, rhonchi or rales.  Abdominal:     General: There is no distension.     Palpations: Abdomen is soft.     Tenderness: There is no abdominal tenderness. There is no rebound.  Musculoskeletal:        General: No tenderness. Normal range of motion.     Cervical back: Normal range of motion and neck supple.  Skin:    General: Skin is warm and dry.     Findings: No abrasion or rash.  Neurological:     Mental Status: He is alert and oriented to person, place, and time. Mental status is at baseline.     GCS: GCS eye subscore is 4. GCS verbal subscore is 5. GCS motor subscore is 6.     Cranial Nerves: No cranial nerve deficit.      Sensory: No sensory deficit.     Motor: Motor function is intact.  Psychiatric:        Attention and Perception: Attention normal.        Mood and Affect: Mood normal.        Speech: Speech normal.        Behavior: Behavior normal.        Thought Content: Thought content does not include homicidal or suicidal ideation. Thought content does not include homicidal or suicidal plan.     ED Results / Procedures / Treatments   Labs (all labs ordered are listed, but only abnormal results are displayed) Labs Reviewed  CBC WITH DIFFERENTIAL/PLATELET  COMPREHENSIVE METABOLIC PANEL  ETHANOL  RAPID URINE DRUG SCREEN, HOSP PERFORMED    EKG None  Radiology No results found.  Procedures Procedures    Medications Ordered in ED Medications - No data to display  ED Course/ Medical Decision Making/ A&P                             Medical Decision Making Amount and/or Complexity of Data Reviewed Labs: ordered.  Risk Prescription drug management.   Patient presented under IVC.  Patient's labs significant for acute kidney injury secondary to rhabdomyolysis.  Will be given IV fluids at this time.  Will require admission.  Will consult hospitalist team        Final Clinical Impression(s) / ED Diagnoses Final diagnoses:  None    Rx / DC Orders ED Discharge Orders     None         Lorre Nick, MD 03/01/23 2205

## 2023-03-01 NOTE — Progress Notes (Signed)
DISCHARGE NOTE HOME Gerald Mills to be discharged Home per MD order. Diagnosis, treatment, prescriptions and follow up appointments discussed with the patient who verbalized knowledge and understanding. Patient will monitor and document blood pressure and HR at home for follow up appointment.  Prescriptions given to patient; medication list explained in detail. Patient verbalized understanding of all..  Skin clean, dry and intact without evidence of skin break down, no evidence of skin tears noted. IV catheter discontinued intact. Site without signs and symptoms of complications. Dressing and pressure applied. Pt denies pain at the site currently. No complaints noted. VSS. No complaints of pain  Patient free of lines, drains, and wounds.   An After Visit Summary (AVS) was printed and given to the patient. Patient escorted via wheelchair, and discharged home via private auto.  Tresa Endo, RN

## 2023-03-01 NOTE — ED Notes (Signed)
Pt refusing VS as well as IV placement at this time.

## 2023-03-01 NOTE — ED Notes (Signed)
Patient has IVC from community and needs first exam.before 03/02/23 1700.

## 2023-03-02 ENCOUNTER — Encounter (HOSPITAL_COMMUNITY): Payer: Self-pay | Admitting: Family Medicine

## 2023-03-02 ENCOUNTER — Other Ambulatory Visit: Payer: Self-pay

## 2023-03-02 DIAGNOSIS — Z046 Encounter for general psychiatric examination, requested by authority: Secondary | ICD-10-CM | POA: Diagnosis not present

## 2023-03-02 DIAGNOSIS — F39 Unspecified mood [affective] disorder: Secondary | ICD-10-CM | POA: Diagnosis not present

## 2023-03-02 DIAGNOSIS — F1910 Other psychoactive substance abuse, uncomplicated: Secondary | ICD-10-CM | POA: Diagnosis not present

## 2023-03-02 LAB — COMPREHENSIVE METABOLIC PANEL
ALT: 127 U/L — ABNORMAL HIGH (ref 0–44)
AST: 354 U/L — ABNORMAL HIGH (ref 15–41)
Albumin: 3.9 g/dL (ref 3.5–5.0)
Alkaline Phosphatase: 76 U/L (ref 38–126)
Anion gap: 14 (ref 5–15)
BUN: 35 mg/dL — ABNORMAL HIGH (ref 6–20)
CO2: 24 mmol/L (ref 22–32)
Calcium: 9.3 mg/dL (ref 8.9–10.3)
Chloride: 95 mmol/L — ABNORMAL LOW (ref 98–111)
Creatinine, Ser: 2.48 mg/dL — ABNORMAL HIGH (ref 0.61–1.24)
GFR, Estimated: 33 mL/min — ABNORMAL LOW (ref >60)
Glucose, Bld: 186 mg/dL — ABNORMAL HIGH (ref 70–99)
Potassium: 4.3 mmol/L (ref 3.5–5.1)
Sodium: 133 mmol/L — ABNORMAL LOW (ref 135–145)
Total Bilirubin: 1 mg/dL (ref 0.3–1.2)
Total Protein: 7.6 g/dL (ref 6.5–8.1)

## 2023-03-02 LAB — CK: Total CK: 18852 U/L — ABNORMAL HIGH (ref 49–397)

## 2023-03-02 MED ORDER — LORAZEPAM 1 MG PO TABS
1.0000 mg | ORAL_TABLET | ORAL | Status: AC | PRN
  Administered 2023-03-04: 1 mg via ORAL
  Filled 2023-03-02: qty 1

## 2023-03-02 MED ORDER — POTASSIUM CHLORIDE CRYS ER 20 MEQ PO TBCR
40.0000 meq | EXTENDED_RELEASE_TABLET | Freq: Once | ORAL | Status: AC
  Administered 2023-03-02: 40 meq via ORAL
  Filled 2023-03-02: qty 2

## 2023-03-02 MED ORDER — THIAMINE MONONITRATE 100 MG PO TABS
100.0000 mg | ORAL_TABLET | Freq: Every day | ORAL | Status: DC
  Administered 2023-03-02 – 2023-03-05 (×4): 100 mg via ORAL
  Filled 2023-03-02 (×4): qty 1

## 2023-03-02 MED ORDER — FOLIC ACID 1 MG PO TABS
1.0000 mg | ORAL_TABLET | Freq: Every day | ORAL | Status: DC
  Administered 2023-03-02 – 2023-03-05 (×4): 1 mg via ORAL
  Filled 2023-03-02 (×4): qty 1

## 2023-03-02 MED ORDER — ESCITALOPRAM OXALATE 10 MG PO TABS
10.0000 mg | ORAL_TABLET | Freq: Every day | ORAL | Status: DC
  Administered 2023-03-02 – 2023-03-05 (×4): 10 mg via ORAL
  Filled 2023-03-02 (×4): qty 1

## 2023-03-02 MED ORDER — AMLODIPINE BESYLATE 10 MG PO TABS
10.0000 mg | ORAL_TABLET | Freq: Every day | ORAL | Status: DC
  Administered 2023-03-02 – 2023-03-05 (×4): 10 mg via ORAL
  Filled 2023-03-02 (×2): qty 1
  Filled 2023-03-02: qty 2
  Filled 2023-03-02: qty 1

## 2023-03-02 MED ORDER — THIAMINE HCL 100 MG/ML IJ SOLN: 100.0000 mg | Freq: Every day | INTRAMUSCULAR | Status: DC

## 2023-03-02 MED ORDER — LOPERAMIDE HCL 2 MG PO CAPS: 4.0000 mg | ORAL_CAPSULE | ORAL | Status: DC | PRN

## 2023-03-02 MED ORDER — ADULT MULTIVITAMIN W/MINERALS CH
1.0000 | ORAL_TABLET | Freq: Every day | ORAL | Status: DC
  Administered 2023-03-02 – 2023-03-05 (×4): 1 via ORAL
  Filled 2023-03-02 (×4): qty 1

## 2023-03-02 MED ORDER — CALCIUM CARBONATE ANTACID 500 MG PO CHEW
1.0000 | CHEWABLE_TABLET | Freq: Once | ORAL | Status: AC
  Administered 2023-03-02: 200 mg via ORAL
  Filled 2023-03-02: qty 1

## 2023-03-02 MED ORDER — LORAZEPAM 1 MG PO TABS: 1.0000 mg | ORAL_TABLET | ORAL | Status: AC | PRN

## 2023-03-02 NOTE — Assessment & Plan Note (Signed)
CK >12000 likely secondary to drug use and agitation before admission -pt refusing IV fluid as stated above

## 2023-03-02 NOTE — Assessment & Plan Note (Signed)
-  potassium of 3.3 on presentation -will give oral potassium supplementation

## 2023-03-02 NOTE — Assessment & Plan Note (Signed)
-  UDS positive for benzo and opioids although he denies drug use in the past 4 days -keep on CIWA protocol. Pt refusing other interventions at this time.

## 2023-03-02 NOTE — Assessment & Plan Note (Signed)
-  continue amlodipine -hold losartan-HCTZ due to acute renal failure

## 2023-03-02 NOTE — Consult Note (Signed)
[facility] Face-to-Face Psychiatry Consult   Reason for Consult: IVC'ed last night, refusing treatment. Assess and make recommendations regarding decision making capacity.  Referring Physician:  Dr. Sharl Ma MD Patient Identification: Gerald Mills MRN:  [id number] Principal Diagnosis: Acute renal failure Diagnosis:  Principal Problem:   Acute renal failure Active Problems:   Hypertension   Mood disorder   Hypokalemia   Rhabdomyolysis   Polysubstance abuse   Involuntary commitment   Hypocalcemia   Total Time spent with patient: 1 hour  Subjective:   Gerald Mills is a 42 y.o. male patient admitted with agitation. Patient recently discharged yesterday and returned later that evening for agitation. He was find to have increase CK levels, electrolyte imbalance and AKI.   Patient seen and reassessed by psychiatric nurse practitioner. During the weekend he was seen by Dr. Jannifer Franklin.  He was determined to be in his psychiatric baseline, and was continued on Lexapro 10 mg for depression.  He is tolerating well at this time. He is unable to identify his reasons for being in the hospital with the exception of " I was told I had to come here.  Once I arrived they told me my kidneys numbers were bad. The doctor is recommending IV flush, to flush out the toxins in my kidneys.  If I do not get treatment for my kidneys I could end up on dialysis or dead." He denies any other psychosis symptoms that he presented with on day 1. Despite his primary diagnosis of agitation, he was presenting with new onset of aggression and confusion.  Since this admission, patient has been calm and cooperative, there have been no disruptive behaviors or signs of physical/verbal aggression.  Patient did refuse treatment to include vital signs, IV placement, and fluid resuscitation. After completing capacity evaluation, patient did agree to receiving IV resuscitation. This provider discussed with patient his lab results, and ongoing upward  trend of CK that may result in severe renal damage.  In which he verbalizes understanding and agrees to treatment.  He does state that he was upset and frustrated, following the argument with his mother and police bringing him to the hospital.  Pt is IVC.  Pt denies SI or HI. Pt denies etoh or drug use since discharge, urine drug screen positive for benzodiazepines and opiates, in which he did receive during his hospitalization and BAL negative on admission.  Pt alert and oriented x4. His speech is normal and clear yet cooperative.   Patient seen and reassessed by the psychiatric nurse practitioner; chart reviewed and case discussed with Dr. Gasper Sells.  On evaluation Gerald Mills is observed to be lying in bed, eyes closed however he does wake up and acknowledge entry into the room.  He also sits up to participate and engage with Clinical research associate.  Patient does show willingness to participate in his treatment plan, denies having any comments questions or concerns.  He is able to verbalize wanting to go have (prisonlike restrictions remove), and importance of compliance with treatment.  Patient continues to denies suicidal ideations, homicidal ideations, and or auditory or visual hallucinations.  Patient reports tolerating well his oral medication, continues to lack some insight although he appears to be psychiatrically stable.  Patient is suspected to be at baseline at this time, and will psych clear.  He does not appear to be responding to internal stimuli, external stimuli, and or exhibiting delusional thought disorder.  Presents with no symptoms of mania. The patient denies any difficulties with sleep, irritability,  guilt, loss of energy, decrease in concentration, anhedonia, psychomotor retardation or suicidal ideations. At the conclusion of the evaluation, the patient voiced no other concerns.  Mother is requesting he gets an evaluation for his mental health.   Called Gerald Mills, at phone number provided on  IVC paperwork.  Mother reports that she feels that he needs an outpatient evaluation for psychiatry. She states he has low self system, and has not recovered since breaking up with his ex girlfriend (long term relationship).  Last Thursday he was in the backyard yelling. She reports she continues to support him, and he has lived with her for 3 years. She states when he locked himself in the room he is screaming he wants to die. "  Police took him Friday for detox and it didn't help. I dont know what he is on, but the discharge paperwork said something about opium or something. He has anxiety and depression real bad. He is in a dark place.    As per IVC" respondent is on unknown substance and somehow got access to it again today.  The respondent went into [facility] Friday the 19th and signed himself out today and went to the hospital where his mother picked him up.  Responded has since locked himself in the bathroom and is screaming random noises scaring the neighbors who have called the police multiple times.  Respondent states that he does not want to live and has soiled himself acting like he is going to collapse.  No one can get him to see if he is okay.  Respond to has been acting aggressive and hostile in the past week checking doors from the wall and bottling up his fist hitting his own hands.  Without intervention the respondent could harm himself or others."  On evaluation patient does deny any recent substance use since discharge from hospital yesterday.  He reports seeking help at [facility], however denies signing himself out of the hospital.  Chart review does show patient was discharged as scheduled.  Patient denies any recent and or current suicidal thoughts, suicidal ideations with the plan, intent or self-harm behaviors.  He further denies any recent history of violence.  In regards to damage to doors, he states that the doors were hanging off already, and no additional damage was done.  HPI:   42 year old Male with PMH of HTN, osteoarthritis, noncompliance, tobacco use disorder and opiate use disorder sent to ED from detox due to sinus bradycardia. He was admitted to the hospital for AKI, intractable nausea, vomiting and sinus bradycardia. However, psychiatric consultation was initiated after patient reports being depressed due to going through divorce. Patient reports that he was never married to his partner but they were in a relationship for about 20 years until few months ago when she asked for separation.   Past Psychiatric History: Depression - currently on Lexapro .   Risk to Self:  Denies Risk to Others:  Denies Prior Inpatient Therapy:  none reported by patient Prior Outpatient Therapy:  none per patient  Past Medical History:  Past Medical History:  Diagnosis Date   Hypertension    No past surgical history on file. Family History:  Family History  Problem Relation Age of Onset   Diabetes Mother    Hypertension Mother    Healthy Father    Hypertension Father    Family Psychiatric  History:   Social History:  Social History   Substance and Sexual Activity  Alcohol Use Yes   Comment:  occ     Social History   Substance and Sexual Activity  Drug Use Never    Social History   Socioeconomic History   Marital status: Single    Spouse name: Not on file   Number of children: Not on file   Years of education: Not on file   Highest education level: Not on file  Occupational History   Not on file  Tobacco Use   Smoking status: Every Day    Packs/day: 0.50    Years: 24.00    Additional pack years: 0.00    Total pack years: 12.00    Types: Cigarettes   Smokeless tobacco: Never  Vaping Use   Vaping Use: Never used  Substance and Sexual Activity   Alcohol use: Yes    Comment: occ   Drug use: Never   Sexual activity: Yes    Birth control/protection: None  Other Topics Concern   Not on file  Social History Narrative   Not on file   Social  Determinants of Health   Financial Resource Strain: Not on file  Food Insecurity: No Food Insecurity (02/26/2023)   Hunger Vital Sign    Worried About Running Out of Food in the Last Year: Never true    Ran Out of Food in the Last Year: Never true  Transportation Needs: No Transportation Needs (02/26/2023)   PRAPARE - Administrator, Civil Service (Medical): No    Lack of Transportation (Non-Medical): No  Physical Activity: Not on file  Stress: Not on file  Social Connections: Not on file   Additional Social History:    Allergies:  No Known Allergies  Labs:  Results for orders placed or performed during the hospital encounter of 03/01/23 (from the past 48 hour(s))  CBC with Differential/Platelet     Status: Abnormal   Collection Time: 03/01/23  7:56 PM  Result Value Ref Range   WBC 10.3 4.0 - 10.5 K/uL   RBC 5.03 4.22 - 5.81 MIL/uL   Hemoglobin 15.0 13.0 - 17.0 g/dL   HCT 16.1 09.6 - 04.5 %   MCV 91.5 80.0 - 100.0 fL   MCH 29.8 26.0 - 34.0 pg   MCHC 32.6 30.0 - 36.0 g/dL   RDW 40.9 81.1 - 91.4 %   Platelets 221 150 - 400 K/uL   nRBC 0.0 0.0 - 0.2 %   Neutrophils Relative % 81 %   Neutro Abs 8.3 (H) 1.7 - 7.7 K/uL   Lymphocytes Relative 6 %   Lymphs Abs 0.6 (L) 0.7 - 4.0 K/uL   Monocytes Relative 13 %   Monocytes Absolute 1.3 (H) 0.1 - 1.0 K/uL   Eosinophils Relative 0 %   Eosinophils Absolute 0.0 0.0 - 0.5 K/uL   Basophils Relative 0 %   Basophils Absolute 0.0 0.0 - 0.1 K/uL   Immature Granulocytes 0 %   Abs Immature Granulocytes 0.02 0.00 - 0.07 K/uL    Comment: Performed at [facility], [address] [place], [place] [address]  Comprehensive metabolic panel     Status: Abnormal   Collection Time: 03/01/23  7:56 PM  Result Value Ref Range   Sodium 136 135 - 145 mmol/L   Potassium 3.3 (L) 3.5 - 5.1 mmol/L   Chloride 100 98 - 111 mmol/L   CO2 23 22 - 32 mmol/L   Glucose, Bld 139 (H) 70 - 99 mg/dL    Comment: Glucose reference range  applies only to samples taken after  fasting for at least 8 hours.   BUN 24 (H) 6 - 20 mg/dL   Creatinine, Ser 1.61 (H) 0.61 - 1.24 mg/dL   Calcium 8.8 (L) 8.9 - 10.3 mg/dL   Total Protein 8.2 (H) 6.5 - 8.1 g/dL   Albumin 4.1 3.5 - 5.0 g/dL   AST 096 (H) 15 - 41 U/L   ALT 73 (H) 0 - 44 U/L   Alkaline Phosphatase 73 38 - 126 U/L   Total Bilirubin 1.1 0.3 - 1.2 mg/dL   GFR, Estimated 30 (L) >60 mL/min    Comment: (NOTE) Calculated using the CKD-EPI Creatinine Equation (2021)    Anion gap 13 5 - 15    Comment: Performed at [facility], [address], [place], [place] [address]  Ethanol     Status: None   Collection Time: 03/01/23  7:56 PM  Result Value Ref Range   Alcohol, Ethyl (B) <10 <10 mg/dL    Comment: (NOTE) Lowest detectable limit for serum alcohol is 10 mg/dL.  For medical purposes only. Performed at [facility], [address], [place], [place] [address]   CK     Status: Abnormal   Collection Time: 03/01/23  7:56 PM  Result Value Ref Range   Total CK 12,457 (H) 49 - 397 U/L    Comment: RESULT CONFIRMED BY MANUAL DILUTION Performed at [facility], [address] [place], [place] [address]   Rapid urine drug screen (hospital performed)     Status: Abnormal   Collection Time: 03/01/23  9:22 PM  Result Value Ref Range   Opiates POSITIVE (A) NONE DETECTED   Cocaine NONE DETECTED NONE DETECTED   Benzodiazepines POSITIVE (A) NONE DETECTED   Amphetamines NONE DETECTED NONE DETECTED   Tetrahydrocannabinol NONE DETECTED NONE DETECTED   Barbiturates NONE DETECTED NONE DETECTED    Comment: (NOTE) DRUG SCREEN FOR MEDICAL PURPOSES ONLY.  IF CONFIRMATION IS NEEDED FOR ANY PURPOSE, NOTIFY LAB WITHIN 5 DAYS.  LOWEST DETECTABLE LIMITS FOR URINE DRUG SCREEN Drug Class                     Cutoff (ng/mL) Amphetamine and metabolites    1000 Barbiturate and metabolites    200 Benzodiazepine                 200 Opiates  and metabolites        300 Cocaine and metabolites        300 THC                            50 Performed at [facility], [address], [place], [place] [address]   CK     Status: Abnormal   Collection Time: 03/02/23  9:51 AM  Result Value Ref Range   Total CK 18,852 (H) 49 - 397 U/L    Comment: RESULT CONFIRMED BY MANUAL DILUTION Performed at [facility], [address] [place], [place] [address]   Comprehensive metabolic panel     Status: Abnormal   Collection Time: 03/02/23  9:51 AM  Result Value Ref Range   Sodium 133 (L) 135 - 145 mmol/L   Potassium 4.3 3.5 - 5.1 mmol/L   Chloride 95 (L) 98 - 111 mmol/L   CO2 24 22 - 32 mmol/L   Glucose, Bld 186 (H) 70 - 99 mg/dL    Comment: Glucose reference range  applies only to samples taken after fasting for at least 8 hours.   BUN 35 (H) 6 - 20 mg/dL   Creatinine, Ser 1.61 (H) 0.61 - 1.24 mg/dL   Calcium 9.3 8.9 - 09.6 mg/dL   Total Protein 7.6 6.5 - 8.1 g/dL   Albumin 3.9 3.5 - 5.0 g/dL   AST 045 (H) 15 - 41 U/L   ALT 127 (H) 0 - 44 U/L   Alkaline Phosphatase 76 38 - 126 U/L   Total Bilirubin 1.0 0.3 - 1.2 mg/dL   GFR, Estimated 33 (L) >60 mL/min    Comment: (NOTE) Calculated using the CKD-EPI Creatinine Equation (2021)    Anion gap 14 5 - 15    Comment: Performed at [facility], [address] [place], [place] [address]    Current Facility-Administered Medications  Medication Dose Route Frequency Provider Last Rate Last Admin   amLODipine (NORVASC) tablet 10 mg  10 mg Oral Daily Tu, Ching T, DO   10 mg at 03/02/23 1914   escitalopram (LEXAPRO) tablet 10 mg  10 mg Oral Daily Tu, Ching T, DO   10 mg at 03/02/23 7829   folic acid (FOLVITE) tablet 1 mg  1 mg Oral Daily Tu, Ching T, DO   1 mg at 03/02/23 5621   lactated ringers infusion   Intravenous Continuous Lorre Nick, MD       loperamide (IMODIUM) capsule 4 mg  4 mg Oral PRN Tu, Ching T, DO       LORazepam  (ATIVAN) tablet 1-4 mg  1-4 mg Oral Q4H PRN Tu, Ching T, DO       Or   LORazepam (ATIVAN) tablet 1 mg  1 mg Oral Q4H PRN Tu, Ching T, DO       multivitamin with minerals tablet 1 tablet  1 tablet Oral Daily Tu, Ching T, DO   1 tablet at 03/02/23 3086   thiamine (VITAMIN B1) tablet 100 mg  100 mg Oral Daily Tu, Ching T, DO   100 mg at 03/02/23 5784   Or   thiamine (VITAMIN B1) injection 100 mg  100 mg Intravenous Daily Tu, Ching T, DO       Current Outpatient Medications  Medication Sig Dispense Refill   amLODipine (NORVASC) 10 MG tablet Take 1 tablet (10 mg total) by mouth daily. 30 tablet 0   escitalopram (LEXAPRO) 10 MG tablet Take 1 tablet (10 mg total) by mouth daily. (Patient not taking: Reported on 02/25/2023) 90 tablet 3   hydrOXYzine (ATARAX) 25 MG tablet Take 1 tablet (25 mg total) by mouth every 6 (six) hours as needed for up to 15 days for anxiety. 15 tablet 0   losartan-hydrochlorothiazide (HYZAAR) 100-25 MG tablet Take 1 tablet by mouth daily. 30 tablet 0    Musculoskeletal: Strength & Muscle Tone: within normal limits Gait & Station: normal Patient leans: N/A    Psychiatric Specialty Exam:  Presentation  General Appearance:  Appropriate for Environment  Eye Contact: Good  Speech: Clear and Coherent  Speech Volume: Normal  Handedness: Right   Mood and Affect  Mood: Dysphoric  Affect: Appropriate   Thought Process  Thought Processes: Linear; Goal Directed  Descriptions of Associations:Intact  Orientation:Full (Time, Place and Person)  Thought Content:Logical  History of Schizophrenia/Schizoaffective disorder:No  Duration of Psychotic Symptoms:No data recorded Hallucinations:No data recorded  Ideas of Reference:None  Suicidal Thoughts:No data recorded  Homicidal Thoughts:No data recorded   Sensorium  Memory: Immediate Good; Recent Good;  Remote Good  Judgment: Intact  Insight: Fair   Producer, television/film/video: Good  Attention Span: Good  Recall: Dudley Major of Knowledge: Good  Language: Good   Psychomotor Activity  Psychomotor Activity: No data recorded   Assets  Assets: Communication Skills; Desire for Improvement   Sleep  Sleep: No data recorded   Physical Exam: Physical Exam Vitals and nursing note reviewed.  Constitutional:      Appearance: Normal appearance. He is normal weight.  Skin:    Capillary Refill: Capillary refill takes less than 2 seconds.  Neurological:     General: No focal deficit present.     Mental Status: He is alert and oriented to person, place, and time. Mental status is at baseline.  Psychiatric:        Mood and Affect: Mood normal.        Behavior: Behavior normal.        Thought Content: Thought content normal.        Judgment: Judgment normal.    Review of Systems  Genitourinary:  Positive for dysuria.  Musculoskeletal:  Positive for back pain.  Psychiatric/Behavioral:  Positive for depression and substance abuse.    Blood pressure (!) 172/117, pulse 60, temperature 97.7 F (36.5 C), temperature source Oral, resp. rate 18, height 6\' 3"  (1.905 m), weight 115.2 kg, SpO2 97 %. Body mass index is 31.74 kg/m.  Treatment Plan Summary: Medication management and Plan Continue Lexapro 10 mg daily for depression -Consider referring patient to inpatient rehab and psychiatric facility for counseling. -Will not start any antipsychotics or additional psychotropic medications at this time as patient has elevated CK levels.   Will obtain urine drug screen send out to assess for additional illicit substance.   Rescind IVC at this time. DC Recruitment consultant.   He does have capacity to make decisions as it pertains to leaving AMA or refusing labs.   CK, Crt, AST/ALT, continues to increase at this time. TSH 0.332, Continue current treatment modalities with medical team.   Disposition: No evidence of imminent risk to self or others at  present.   Patient does not meet criteria for psychiatric inpatient admission. Supportive therapy provided about ongoing stressors. Psychiatric service signing off. Re-consult as needed  Maryagnes Amos, FNP 03/02/2023 1:13 PM

## 2023-03-02 NOTE — ED Notes (Signed)
ED TO INPATIENT HANDOFF REPORT  ED Nurse Name and Phone #: Crist Infante, RN [phone]   S Name/Age/Gender Gerald Mills 42 y.o. male Room/Bed: WHALB/WHALB  Code Status   Code Status: Full Code  Home/SNF/Other Home Patient oriented to: self, place, time, and situation Is this baseline? Yes   Triage Complete: Triage complete  Chief Complaint Acute renal failure (ARF) [N17.9]  Triage Note Pt bib [employer] with IVC papers. Patient has been acting erratic, tried to rip his moms door off the hinges. Pt calm and cooperative on arrival.    Allergies No Known Allergies  Level of Care/Admitting Diagnosis ED Disposition     ED Disposition  Admit   Condition  --   Comment  Hospital Area: [facility] [[id number]]  Level of Care: Med-Surg [16]  May admit patient to [facility] or [facility] if equivalent level of care is available:: No  Covid Evaluation: Asymptomatic - no recent exposure (last 10 days) testing not required  Diagnosis: Acute renal failure (ARF) [147829]  Admitting Physician: Anselm Jungling [[id number]]  Attending Physician: Anselm Jungling [[id number]]  Certification:: I certify this patient will need inpatient services for at least 2 midnights  Estimated Length of Stay: 3          B Medical/Surgery History Past Medical History:  Diagnosis Date   Hypertension    No past surgical history on file.   A IV Location/Drains/Wounds Patient Lines/Drains/Airways Status     Active Line/Drains/Airways     Name Placement date Placement time Site Days   Peripheral IV 03/02/23 20 G 1" Left Antecubital 03/02/23  0951  Antecubital  less than 1            Intake/Output Last 24 hours  Intake/Output Summary (Last 24 hours) at 03/02/2023 1301 Last data filed at 03/02/2023 1143 Gross per 24 hour  Intake 1848.15 ml  Output --  Net 1848.15 ml    Labs/Imaging Results for orders placed or performed during the hospital encounter of 03/01/23 (from the past 48 hour(s))   CBC with Differential/Platelet     Status: Abnormal   Collection Time: 03/01/23  7:56 PM  Result Value Ref Range   WBC 10.3 4.0 - 10.5 K/uL   RBC 5.03 4.22 - 5.81 MIL/uL   Hemoglobin 15.0 13.0 - 17.0 g/dL   HCT 62.9 52.8 - 41.3 %   MCV 91.5 80.0 - 100.0 fL   MCH 29.8 26.0 - 34.0 pg   MCHC 32.6 30.0 - 36.0 g/dL   RDW 24.4 01.0 - 27.2 %   Platelets 221 150 - 400 K/uL   nRBC 0.0 0.0 - 0.2 %   Neutrophils Relative % 81 %   Neutro Abs 8.3 (H) 1.7 - 7.7 K/uL   Lymphocytes Relative 6 %   Lymphs Abs 0.6 (L) 0.7 - 4.0 K/uL   Monocytes Relative 13 %   Monocytes Absolute 1.3 (H) 0.1 - 1.0 K/uL   Eosinophils Relative 0 %   Eosinophils Absolute 0.0 0.0 - 0.5 K/uL   Basophils Relative 0 %   Basophils Absolute 0.0 0.0 - 0.1 K/uL   Immature Granulocytes 0 %   Abs Immature Granulocytes 0.02 0.00 - 0.07 K/uL    Comment: Performed at [facility], [address], [place], [place] [address]  Comprehensive metabolic panel     Status: Abnormal   Collection Time: 03/01/23  7:56 PM  Result Value Ref Range   Sodium 136 135 - 145 mmol/L  Potassium 3.3 (L) 3.5 - 5.1 mmol/L   Chloride 100 98 - 111 mmol/L   CO2 23 22 - 32 mmol/L   Glucose, Bld 139 (H) 70 - 99 mg/dL    Comment: Glucose reference range applies only to samples taken after fasting for at least 8 hours.   BUN 24 (H) 6 - 20 mg/dL   Creatinine, Ser 1.61 (H) 0.61 - 1.24 mg/dL   Calcium 8.8 (L) 8.9 - 10.3 mg/dL   Total Protein 8.2 (H) 6.5 - 8.1 g/dL   Albumin 4.1 3.5 - 5.0 g/dL   AST 096 (H) 15 - 41 U/L   ALT 73 (H) 0 - 44 U/L   Alkaline Phosphatase 73 38 - 126 U/L   Total Bilirubin 1.1 0.3 - 1.2 mg/dL   GFR, Estimated 30 (L) >60 mL/min    Comment: (NOTE) Calculated using the CKD-EPI Creatinine Equation (2021)    Anion gap 13 5 - 15    Comment: Performed at [facility], [address], [place], [place] [address]  Ethanol     Status: None   Collection Time: 03/01/23  7:56 PM  Result Value Ref  Range   Alcohol, Ethyl (B) <10 <10 mg/dL    Comment: (NOTE) Lowest detectable limit for serum alcohol is 10 mg/dL.  For medical purposes only. Performed at [facility], [address], [place], [place] [address]   CK     Status: Abnormal   Collection Time: 03/01/23  7:56 PM  Result Value Ref Range   Total CK 12,457 (H) 49 - 397 U/L    Comment: RESULT CONFIRMED BY MANUAL DILUTION Performed at [facility], [address], [place], [place] [address]   Rapid urine drug screen (hospital performed)     Status: Abnormal   Collection Time: 03/01/23  9:22 PM  Result Value Ref Range   Opiates POSITIVE (A) NONE DETECTED   Cocaine NONE DETECTED NONE DETECTED   Benzodiazepines POSITIVE (A) NONE DETECTED   Amphetamines NONE DETECTED NONE DETECTED   Tetrahydrocannabinol NONE DETECTED NONE DETECTED   Barbiturates NONE DETECTED NONE DETECTED    Comment: (NOTE) DRUG SCREEN FOR MEDICAL PURPOSES ONLY.  IF CONFIRMATION IS NEEDED FOR ANY PURPOSE, NOTIFY LAB WITHIN 5 DAYS.  LOWEST DETECTABLE LIMITS FOR URINE DRUG SCREEN Drug Class                     Cutoff (ng/mL) Amphetamine and metabolites    1000 Barbiturate and metabolites    200 Benzodiazepine                 200 Opiates and metabolites        300 Cocaine and metabolites        300 THC                            50 Performed at [facility], [address] [place], [place] [address]   CK     Status: Abnormal   Collection Time: 03/02/23  9:51 AM  Result Value Ref Range   Total CK 18,852 (H) 49 - 397 U/L    Comment: RESULT CONFIRMED BY MANUAL DILUTION Performed at [facility], [address] [place], [place] [address]   Comprehensive metabolic panel     Status: Abnormal   Collection Time: 03/02/23  9:51 AM  Result Value Ref Range   Sodium 133 (L)  135 - 145 mmol/L   Potassium 4.3 3.5 - 5.1 mmol/L   Chloride 95 (L) 98 - 111 mmol/L   CO2 24 22 - 32  mmol/L   Glucose, Bld 186 (H) 70 - 99 mg/dL    Comment: Glucose reference range applies only to samples taken after fasting for at least 8 hours.   BUN 35 (H) 6 - 20 mg/dL   Creatinine, Ser 1.61 (H) 0.61 - 1.24 mg/dL   Calcium 9.3 8.9 - 09.6 mg/dL   Total Protein 7.6 6.5 - 8.1 g/dL   Albumin 3.9 3.5 - 5.0 g/dL   AST 045 (H) 15 - 41 U/L   ALT 127 (H) 0 - 44 U/L   Alkaline Phosphatase 76 38 - 126 U/L   Total Bilirubin 1.0 0.3 - 1.2 mg/dL   GFR, Estimated 33 (L) >60 mL/min    Comment: (NOTE) Calculated using the CKD-EPI Creatinine Equation (2021)    Anion gap 14 5 - 15    Comment: Performed at [facility], [address] [place], [place] [address]   No results found.  Pending Labs Unresulted Labs (From admission, onward)     Start     Ordered   03/02/23 0958  Opiate, quantitative, urine  Add-on,   AD        03/02/23 0957            Vitals/Pain Today's Vitals   03/01/23 1908 03/02/23 0433 03/02/23 0727 03/02/23 0900  BP: (!) 162/119 (!) 153/99  (!) 172/117  Pulse: (!) 103 (!) 58  60  Resp: 17 18  18   Temp: (!) 97.4 F (36.3 C) 99 F (37.2 C)  97.7 F (36.5 C)  TempSrc: Oral Oral  Oral  SpO2: 100% 99%  97%  Weight: 115.2 kg     Height: 6\' 3"  (1.905 m)     PainSc:   Asleep     Isolation Precautions No active isolations  Medications Medications  lactated ringers infusion ( Intravenous Patient Refused/Not Given 03/02/23 0027)  LORazepam (ATIVAN) tablet 1-4 mg (has no administration in time range)    Or  LORazepam (ATIVAN) tablet 1 mg (has no administration in time range)  thiamine (VITAMIN B1) tablet 100 mg (100 mg Oral Given 03/02/23 0916)    Or  thiamine (VITAMIN B1) injection 100 mg ( Intravenous See Alternative 03/02/23 0916)  folic acid (FOLVITE) tablet 1 mg (1 mg Oral Given 03/02/23 0917)  multivitamin with minerals tablet 1 tablet (1 tablet Oral Given 03/02/23 0917)  loperamide (IMODIUM) capsule 4 mg (has no administration in time range)   amLODipine (NORVASC) tablet 10 mg (10 mg Oral Given 03/02/23 0917)  escitalopram (LEXAPRO) tablet 10 mg (10 mg Oral Given 03/02/23 0916)  lactated ringers bolus 2,000 mL (0 mLs Intravenous Stopped 03/02/23 1143)  potassium chloride SA (KLOR-CON M) CR tablet 40 mEq (40 mEq Oral Given 03/02/23 0102)  calcium carbonate (TUMS - dosed in mg elemental calcium) chewable tablet 200 mg of elemental calcium (200 mg of elemental calcium Oral Given 03/02/23 0626)    Mobility walks     Focused Assessments Neuro Assessment Handoff:  Swallow screen pass?  N/A Cardiac Rhythm: Normal sinus rhythm       Neuro Assessment: Within Defined Limits Neuro Checks:      Has TPA been given? No If patient is a Neuro Trauma and patient is going to OR before floor call report to 4N Charge nurse: [phone] or [phone]   R Recommendations: See  Admitting Provider Note  Report given to:   Additional Notes:  IVC

## 2023-03-02 NOTE — Progress Notes (Addendum)
[facility]  PROGRESS NOTE  Gerald Mills ZOX:[id number] DOB: 1981-04-30 DOA: 03/01/2023 PCP: Pcp, No   Brief HPI:   42 year old male with medical history of hypertension, mood disorder, tobacco and opioid abuse who was initially admitted as  IVC by mother for agitation.  Patient was just discharged for acute kidney injury, sinus bradycardia, electrolyte imbalance, mild rhabdomyolysis with [id number], opioid withdrawal.  Brought to hospital after patient had argument with his mother In the ED he was hypertensive with blood pressure 160/119.  Hypokalemia with potassium 3.3, creatinine 2.69 [place] [address], AST elevated 203, ALT 73.  UDS positive for opioids and benzodiazepine.  EtOH level less than 10.    Subjective   Patient seen and examined, has been refusing treatment since last night.  Did not get IV fluids.  Also has been refusing labs this morning.   Assessment/Plan:    Acute kidney injury -Presented with worsening creatinine 2.69, GFR less than 30 -Baseline creatinine around 1.0, 1 year ago -Also has elevated CK 12,457 -Ordered to get  IV fluids, LR at 125 mL/h; however patient has been refusing treatment in the hospital. -Repeat labs obtained this morning showed CK now elevated to 18, 852 -BUN/creatinine is elevated at 35/2.48   Rhabdomyolysis -Likely in setting of drug abuse, agitation -He has been refusing IV fluids last night as he has been refusing IV -CK level obtained today is elevated at 18, 852 -Discussed with patient the need to restart IV fluids as patient has been refusing since last night. -Explained that rhabdomyolysis can worsen the renal function; and he needs IV fluids for that. -Agreed to get IV fluids.  Continue LR at 125 mL/h.  Transaminitis -Patient did elevated AST and ALT, likely in setting of rhabdomyolysis as above -Follow LFTs in AM.  Hypocalcemia -Oral calcium supplement  Polysubstance abuse -UDS positive for benzodiazepines and  opioids -Started on CIWA protocol  Hypokalemia -Oral supplementation given -Follow serum potassium today  Mood disorder -pt under IVC by mother for agitation. Currently calm and cooperative although may need sitter if he becomes agitated.  -psych was consulted in earlier admission. -continue Lexapro -Patient has been [facility], will consult psychiatry to assess decision-making capacity in case patient wants to leave AMA.   Hypertension -continue amlodipine -hold losartan-HCTZ due to acute renal failure  Medications     amLODipine  10 mg Oral Daily   escitalopram  10 mg Oral Daily   folic acid  1 mg Oral Daily   multivitamin with minerals  1 tablet Oral Daily   thiamine  100 mg Oral Daily   Or   thiamine  100 mg Intravenous Daily     Data Reviewed:   CBG:  No results for input(Mills): "GLUCAP" in the last 168 hours.  SpO2: 97 %    Vitals:   03/01/23 1908 03/02/23 0433 03/02/23 0900  BP: (!) 162/119 (!) 153/99 (!) 172/117  Pulse: (!) 103 (!) 58 60  Resp: Temp: (!) 97.4 F (36.3 C) 99 F (37.2 C) 97.7 F (36.5 C)  TempSrc: Oral Oral Oral  SpO2: 100% 99% 97%  Weight: 115.2 kg    Height:  (1.905 m)        Data Reviewed:  Basic Metabolic Panel: Recent Labs  Lab 02/25/23 1735 02/25/23 2128 02/26/23 0944 02/27/23 0100 02/27/23 2011 02/28/23 0623 02/28/23 1306 03/01/23 0616 03/01/23 1956  NA 138 135 136 137  --  136  --  137 136  K  3.5 3.1* 3.1* 3.3*  --  3.1* 3.1* 3.6 3.3*  CL 97* 96* 100 102  --  97*  --  101 100  CO2 --  26  --  27 23  GLUCOSE 136* 113* 205* 185*  --  135*  --  116* 139*  BUN 35* 34* 34* 21*  --  11  --  13 24*  CREATININE 2.67* 2.43* 1.76* 1.39*  --  1.13  --  1.12 2.69*  CALCIUM 9.3 9.5 8.6* 8.5*  --  9.1  --  8.7* 8.8*  MG 2.0 2.0 1.8 1.7  --   --   --  1.8  --   PHOS  --   --   --  1.4* 1.3* 1.9*  --  2.6  --     CBC: Recent Labs  Lab 02/25/23 1735 02/25/23 2128 03/01/23 1956  WBC 8.7 8.7 10.3   NEUTROABS 6.1 5.9 8.3*  HGB 13.0 13.6 15.0  HCT 40.4 42.8 46.0  MCV 92.0 92.2 91.5  PLT 195 215 221    LFT Recent Labs  Lab 02/25/23 1735 02/25/23 2128 02/26/23 0944 02/27/23 0100 03/01/23 1956  AST 59* 60* 42* 32 203*  ALT 32 36 32 31 73*  ALKPHOS 76 76 75 62 73  BILITOT 1.9* 1.7* 1.0 1.2 1.1  PROT 7.2 7.8 5.8* 6.1* 8.2*  ALBUMIN 4.8 4.8 3.4* 3.4* 4.1     Antibiotics: Anti-infectives (From admission, onward)    None        DVT prophylaxis: SCDs  Code Status: Full code  Family Communication:    CONSULTS    Objective    Physical Examination:   General: Appears in no acute distress Cardiovascular: S1-S2, regular, no murmur auscultated Respiratory: Lungs clear to auscultation bilaterally Abdomen: Soft, nontender, no organomegaly Extremities: No edema in the lower extremities Neurologic: Cranial nerves II through XII grossly intact, no focal deficit noted   Status is: Inpatient:             Gerald Mills Gerald Mills   [facility] If 7PM-7AM, please contact night-coverage at [email or web address], Office  [phone]   03/02/2023, 9:13 AM  LOS: 1 day

## 2023-03-02 NOTE — Assessment & Plan Note (Addendum)
-  worsening creatinine of 2.69 and GFR <30 secondary to rhabdomyolysis and possible drug use although pt denies. UDS is positive for opioids and benzodiazepines.  -Aggressive IV fluid resuscitation need. However he is refusing placement of IV access at this time. I have discussed repeatedly potentials of him going into complete renal failure without IV fluids. He reports understanding just wants to take fluids orally.

## 2023-03-02 NOTE — Assessment & Plan Note (Signed)
-  replenish with oral calcium supplement

## 2023-03-02 NOTE — Assessment & Plan Note (Addendum)
-  pt under IVC by mother for agitation. Currently calm and cooperative although may need sitter if he becomes agitated.  -psych was consulted in earlier admission. -continue Lexapro

## 2023-03-02 NOTE — ED Notes (Signed)
Patient refusing AM lab draw.

## 2023-03-03 ENCOUNTER — Inpatient Hospital Stay (HOSPITAL_COMMUNITY): Payer: Self-pay

## 2023-03-03 LAB — COMPREHENSIVE METABOLIC PANEL
ALT: 97 U/L — ABNORMAL HIGH (ref 0–44)
AST: 175 U/L — ABNORMAL HIGH (ref 15–41)
Albumin: 3.2 g/dL — ABNORMAL LOW (ref 3.5–5.0)
Alkaline Phosphatase: 62 U/L (ref 38–126)
Anion gap: 10 (ref 5–15)
BUN: 44 mg/dL — ABNORMAL HIGH (ref 6–20)
CO2: 28 mmol/L (ref 22–32)
Calcium: 8.4 mg/dL — ABNORMAL LOW (ref 8.9–10.3)
Chloride: 98 mmol/L (ref 98–111)
Creatinine, Ser: 2.51 mg/dL — ABNORMAL HIGH (ref 0.61–1.24)
GFR, Estimated: 32 mL/min — ABNORMAL LOW (ref >60)
Glucose, Bld: 110 mg/dL — ABNORMAL HIGH (ref 70–99)
Potassium: 3.5 mmol/L (ref 3.5–5.1)
Sodium: 136 mmol/L (ref 135–145)
Total Bilirubin: 0.5 mg/dL (ref 0.3–1.2)
Total Protein: 6.1 g/dL — ABNORMAL LOW (ref 6.5–8.1)

## 2023-03-03 LAB — PROTEIN / CREATININE RATIO, URINE
Creatinine, Urine: 31 mg/dL
Total Protein, Urine: <6 mg/dL

## 2023-03-03 LAB — URINALYSIS, ROUTINE W REFLEX MICROSCOPIC
Bacteria, UA: NONE SEEN
Bilirubin Urine: NEGATIVE
Glucose, UA: 50 mg/dL — AB
Ketones, ur: NEGATIVE mg/dL
Leukocytes,Ua: NEGATIVE
Nitrite: NEGATIVE
Protein, ur: NEGATIVE mg/dL
Specific Gravity, Urine: 1.005 (ref 1.005–1.030)
pH: 6 (ref 5.0–8.0)

## 2023-03-03 LAB — CK: Total CK: 7936 U/L — ABNORMAL HIGH (ref 49–397)

## 2023-03-03 LAB — CBC
HCT: 39.5 % (ref 39.0–52.0)
Hemoglobin: 12.8 g/dL — ABNORMAL LOW (ref 13.0–17.0)
MCH: 29.7 pg (ref 26.0–34.0)
MCHC: 32.4 g/dL (ref 30.0–36.0)
MCV: 91.6 fL (ref 80.0–100.0)
Platelets: 223 10*3/uL (ref 150–400)
RBC: 4.31 MIL/uL (ref 4.22–5.81)
RDW: 14 % (ref 11.5–15.5)
WBC: 7.5 10*3/uL (ref 4.0–10.5)
nRBC: 0 % (ref 0.0–0.2)

## 2023-03-03 MED ORDER — HYDRALAZINE HCL 25 MG PO TABS
25.0000 mg | ORAL_TABLET | Freq: Four times a day (QID) | ORAL | Status: DC | PRN
  Administered 2023-03-03 – 2023-03-04 (×3): 25 mg via ORAL
  Filled 2023-03-03 (×3): qty 1

## 2023-03-03 NOTE — Progress Notes (Signed)
[facility]  PROGRESS NOTE  Gerald Mills YQM:[id number] DOB: May 29, 1981 DOA: 03/01/2023 PCP: Pcp, No   Brief HPI:   42 year old male with medical history of hypertension, mood disorder, tobacco and opioid abuse who was initially admitted as  IVC by mother for agitation.  Patient was just discharged for acute kidney injury, sinus bradycardia, electrolyte imbalance, mild rhabdomyolysis with [id number], opioid withdrawal.  Brought to hospital after patient had argument with his mother In the ED he was hypertensive with blood pressure 160/119.  Hypokalemia with potassium 3.3, creatinine 2.69 [place] [address], AST elevated 203, ALT 73.  UDS positive for opioids and benzodiazepine.  EtOH level less than 10.    Subjective   Patient seen and examined, complains of muscle aches this morning.  CK is down to 7936.  BUN/creatinine still elevated at 40/2.51   Assessment/Plan:    Acute kidney injury -Presented with worsening creatinine 2.69, GFR less than 30 -Baseline creatinine around 1.0, 1 year ago -Also has elevated CK 12,457 -Ordered to get  IV fluids, LR at 125 mL/h; however initially patient was refusing treatment, wanted to leave AMA.  -After discussion agreed to stay and get IV fluids.  -Repeat CK was 18 852 -Started on LR at 125 mL/h.  -This morning BUN/creatinine is down to 44/2.51 -CK is down to 7936 -Discussed with nephrology, Dr. Signe Colt, will obtain renal ultrasound, urine protein to creatinine ratio.    Hypertension -Blood pressure is elevated -Started on  amlodipine 10 mg daily -Start hydralazine as needed  Rhabdomyolysis -Likely in setting of drug abuse, agitation -He has been refusing IV fluids last night as he has been refusing IV -CK level obtained today is elevated at 18, 852 -Discussed with patient the need to restart IV fluids as patient has been refusing since last night. -Explained that rhabdomyolysis can worsen the renal function; and he needs IV fluids for  that. -Agreed to get IV fluids.  Continue LR at 125 mL/h. -CK is down to 7936  Transaminitis -Patient did elevated AST and ALT, likely in setting of rhabdomyolysis as above -Follow LFTs in AM.  Hypocalcemia -Oral calcium supplement  Polysubstance abuse -UDS positive for benzodiazepines and opioids -Started on CIWA protocol  Hypokalemia -Replete  Mood disorder -pt under IVC by mother for agitation. Currently calm and cooperative although may need sitter if he becomes agitated.  -psych was consulted in earlier admission. -continue Lexapro -Patient has been [facility] psychiatry was consulted  -for decision-making capacity.   IVC has been rescinded.     Hypertension -continue amlodipine -hold losartan-HCTZ due to acute renal failure  Medications     amLODipine  10 mg Oral Daily   escitalopram  10 mg Oral Daily   folic acid  1 mg Oral Daily   multivitamin with minerals  1 tablet Oral Daily   thiamine  100 mg Oral Daily   Or   thiamine  100 mg Intravenous Daily     Data Reviewed:   CBG:  No results for input(s): "GLUCAP" in the last 168 hours.  SpO2: 97 %    Vitals:   03/02/23 2236 03/03/23 0250 03/03/23 0540 03/03/23 0935  BP: (!) 172/103 (!) 164/97 (!) 172/110 (!) 170/92  Pulse: 65 65 64 (!) 55  Resp: Temp: 99.6 F (37.6 C) 99 F (37.2 C) 98.1 F (36.7 C) 98.4 F (36.9 C)  TempSrc: Oral Oral Oral Oral  SpO2: 98% 97% 99% 97%  Weight:  Height:          Data Reviewed:  Basic Metabolic Panel: Recent Labs  Lab 02/25/23 1735 02/25/23 2128 02/26/23 0944 02/27/23 0100 02/27/23 2011 02/28/23 1610 02/28/23 1306 03/01/23 0616 03/01/23 1956 03/02/23 0951 03/03/23 0604  NA 138 135 136 137  --  136  --  137 136 133* 136  K 3.5 3.1* 3.1* 3.3*  --  3.1* 3.1* 3.6 3.3* 4.3 3.5  CL 97* 96* 100 102  --  97*  --  101 100 95* 98  CO2 --  26  --  GLUCOSE 136* 113* 205* 185*  --  135*  --  116* 139* 186* 110*  BUN  35* 34* 34* 21*  --  11  --  13 24* 35* 44*  CREATININE 2.67* 2.43* 1.76* 1.39*  --  1.13  --  1.12 2.69* 2.48* 2.51*  CALCIUM 9.3 9.5 8.6* 8.5*  --  9.1  --  8.7* 8.8* 9.3 8.4*  MG 2.0 2.0 1.8 1.7  --   --   --  1.8  --   --   --   PHOS  --   --   --  1.4* 1.3* 1.9*  --  2.6  --   --   --     CBC: Recent Labs  Lab 02/25/23 1735 02/25/23 2128 03/01/23 1956 03/03/23 0604  WBC 8.7 8.7 10.3 7.5  NEUTROABS 6.1 5.9 8.3*  --   HGB 13.0 13.6 15.0 12.8*  HCT 40.4 42.8 46.0 39.5  MCV 92.0 92.2 91.5 91.6  PLT 195 215 221 223    LFT Recent Labs  Lab 02/26/23 0944 02/27/23 0100 03/01/23 1956 03/02/23 0951 03/03/23 0604  AST 42* 32 203* 354* 175*  ALT 32 31 73* 127* 97*  ALKPHOS 75 62 73 76 62  BILITOT 1.0 1.2 1.1 1.0 0.5  PROT 5.8* 6.1* 8.2* 7.6 6.1*  ALBUMIN 3.4* 3.4* 4.1 3.9 3.2*     Antibiotics: Anti-infectives (From admission, onward)    None        DVT prophylaxis: SCDs  Code Status: Full code  Family Communication:    CONSULTS    Objective    Physical Examination:  General-appears in no acute distress Heart-S1-S2, regular, no murmur auscultated Lungs-clear to auscultation bilaterally Abdomen-soft, nontender, no organomegaly Extremities-no edema in the lower extremities Neuro-alert, oriented x3, no focal deficit noted   Status is: Inpatient:             Gerald Mills S Kamaryn Grimley   [facility] If 7PM-7AM, please contact night-coverage at [email or web address], Office  [phone]   03/03/2023, 12:55 PM  LOS: 2 days

## 2023-03-03 NOTE — Progress Notes (Signed)
Unit secretary Burns Spain called bedside nurse stating that visitors were here to visit patient. Bedside nurse went ask permission about patient having visitors. Patient asked if bedside nurse called the family. Bedside nurse explained to patient that because he is confidential the bedside nurse was not permitted to give any information about him to anyone. Patient seemed frustrated and stated that it was too late. Bedside nurse clarified to see if the patient wanted visitors or not. Patient said it was okay. Charge nurse called downstairs and asked front desk what had happened prior to that. Family had stated that they had spoken to the nurse about the patient's location. Dayshift bedside nurse had not spoken to any family.

## 2023-03-04 LAB — COMPREHENSIVE METABOLIC PANEL
ALT: 88 U/L — ABNORMAL HIGH (ref 0–44)
AST: 134 U/L — ABNORMAL HIGH (ref 15–41)
Albumin: 3 g/dL — ABNORMAL LOW (ref 3.5–5.0)
Alkaline Phosphatase: 53 U/L (ref 38–126)
Anion gap: 9 (ref 5–15)
BUN: 30 mg/dL — ABNORMAL HIGH (ref 6–20)
CO2: 27 mmol/L (ref 22–32)
Calcium: 8.6 mg/dL — ABNORMAL LOW (ref 8.9–10.3)
Chloride: 104 mmol/L (ref 98–111)
Creatinine, Ser: 1.73 mg/dL — ABNORMAL HIGH (ref 0.61–1.24)
GFR, Estimated: 50 mL/min — ABNORMAL LOW (ref >60)
Glucose, Bld: 142 mg/dL — ABNORMAL HIGH (ref 70–99)
Potassium: 3.8 mmol/L (ref 3.5–5.1)
Sodium: 140 mmol/L (ref 135–145)
Total Bilirubin: 0.6 mg/dL (ref 0.3–1.2)
Total Protein: 6.4 g/dL — ABNORMAL LOW (ref 6.5–8.1)

## 2023-03-04 LAB — CK: Total CK: 4591 U/L — ABNORMAL HIGH (ref 49–397)

## 2023-03-04 MED ORDER — MELATONIN 3 MG PO TABS
6.0000 mg | ORAL_TABLET | Freq: Every evening | ORAL | Status: AC | PRN
  Administered 2023-03-04 (×2): 6 mg via ORAL
  Filled 2023-03-04 (×2): qty 2

## 2023-03-04 NOTE — Progress Notes (Signed)
[facility]  PROGRESS NOTE  Gerald Mills:[id number] DOB: 1980/12/19 DOA: 03/01/2023 PCP: Pcp, No   Brief HPI:   42 year old male with medical history of hypertension, mood disorder, tobacco and opioid abuse who was initially admitted as  IVC by mother for agitation.  Patient was just discharged for acute kidney injury, sinus bradycardia, electrolyte imbalance, mild rhabdomyolysis with [id number], opioid withdrawal.  Brought to hospital after patient had argument with his mother In the ED he was hypertensive with blood pressure 160/119.  Hypokalemia with potassium 3.3, creatinine 2.69 [place] [address], AST elevated 203, ALT 73.  UDS positive for opioids and benzodiazepine.  EtOH level less than 10.    Subjective   Patient seen and examined, complains of muscle aches.   Assessment/Plan:    Acute kidney injury -Presented with worsening creatinine 2.69, GFR less than 30 -Baseline creatinine around 1.0, 1 year ago.  Creatinine has improved to 1.73 -Also has elevated CK 12,457 -Ordered to get  IV fluids, LR at 125 mL/h; however initially patient was refusing treatment, wanted to leave AMA.  -After discussion agreed to stay and get IV fluids.  -Repeat CK was 18 852 -Started on LR at 125 mL/h.  -This morning BUN/creatinine is down to 44/2.51 -CK is down to 4591 -Discussed with nephrology, renal ultrasound obtained which showed no hydronephrosis. -Nephrology consulted     Hypertension -Blood pressure is elevated -Started on  amlodipine 10 mg daily -Started hydralazine as needed  Rhabdomyolysis -Likely in setting of drug abuse, agitation -He has been refusing IV fluids last night as he has been refusing IV -CK level obtained today is elevated at 18, 852 -Discussed with patient the need to restart IV fluids as patient has been refusing since last night. -Explained that rhabdomyolysis can worsen the renal function; and he needs IV fluids for that. -Agreed to get IV fluids.   Continue LR at 125 mL/h. -CK is down to 4591  Transaminitis -Patient did elevated AST and ALT, likely in setting of rhabdomyolysis as above -Follow LFTs in AM.  Hypocalcemia -Oral calcium supplement  Polysubstance abuse -UDS positive for benzodiazepines and opioids -Started on CIWA protocol  Hypokalemia -Replete  Mood disorder -pt under IVC by mother for agitation. Currently calm and cooperative although may need sitter if he becomes agitated.  -psych was consulted in earlier admission. -continue Lexapro -Patient has been [facility], psychiatry was consulted  -for decision-making capacity.   IVC has been rescinded.     Hypertension -continue amlodipine -hold losartan-HCTZ due to acute renal failure  Medications     amLODipine  10 mg Oral Daily   escitalopram  10 mg Oral Daily   folic acid  1 mg Oral Daily   multivitamin with minerals  1 tablet Oral Daily   thiamine  100 mg Oral Daily   Or   thiamine  100 mg Intravenous Daily     Data Reviewed:   CBG:  No results for input(s): "GLUCAP" in the last 168 hours.  SpO2: 98 %    Vitals:   03/03/23 1934 03/04/23 0126 03/04/23 0400 03/04/23 1404  BP: (!) 171/91 (!) 185/114 (!) 168/98 (!) 178/109  Pulse: 63  (!) 53 (!) 54  Resp: 18  20 18   Temp: 98 F (36.7 C)  98 F (36.7 C) 98.2 F (36.8 C)  TempSrc: Oral  Oral Oral  SpO2: 100%  99% 98%  Weight:      Height:  Data Reviewed:  Basic Metabolic Panel: Recent Labs  Lab 02/25/23 1735 02/25/23 2128 02/26/23 0944 02/27/23 0100 02/27/23 2011 02/28/23 1610 02/28/23 1306 03/01/23 0616 03/01/23 1956 03/02/23 0951 03/03/23 0604 03/04/23 0914  NA 138 135 136 137  --  136  --  137 136 133* 136 140  K 3.5 3.1* 3.1* 3.3*  --  3.1*   < > 3.6 3.3* 4.3 3.5 3.8  CL 97* 96* 100 102  --  97*  --  101 100 95* 98 104  CO2 28 27 24 27   --  26  --  27 23 24 28 27   GLUCOSE 136* 113* 205* 185*  --  135*  --  116* 139* 186* 110* 142*  BUN 35* 34* 34* 21*  --  11   --  13 24* 35* 44* 30*  CREATININE 2.67* 2.43* 1.76* 1.39*  --  1.13  --  1.12 2.69* 2.48* 2.51* 1.73*  CALCIUM 9.3 9.5 8.6* 8.5*  --  9.1  --  8.7* 8.8* 9.3 8.4* 8.6*  MG 2.0 2.0 1.8 1.7  --   --   --  1.8  --   --   --   --   PHOS  --   --   --  1.4* 1.3* 1.9*  --  2.6  --   --   --   --    < > = values in this interval not displayed.    CBC: Recent Labs  Lab 02/25/23 1735 02/25/23 2128 03/01/23 1956 03/03/23 0604  WBC 8.7 8.7 10.3 7.5  NEUTROABS 6.1 5.9 8.3*  --   HGB 13.0 13.6 15.0 12.8*  HCT 40.4 42.8 46.0 39.5  MCV 92.0 92.2 91.5 91.6  PLT 195 215 221 223    LFT Recent Labs  Lab 02/27/23 0100 03/01/23 1956 03/02/23 0951 03/03/23 0604 03/04/23 0914  AST 32 203* 354* 175* 134*  ALT 31 73* 127* 97* 88*  ALKPHOS 62 73 76 62 53  BILITOT 1.2 1.1 1.0 0.5 0.6  PROT 6.1* 8.2* 7.6 6.1* 6.4*  ALBUMIN 3.4* 4.1 3.9 3.2* 3.0*     Antibiotics: Anti-infectives (From admission, onward)    None        DVT prophylaxis: SCDs  Code Status: Full code  Family Communication:    CONSULTS    Objective    Physical Examination:  General-appears in no acute distress Heart-S1-S2, regular, no murmur auscultated Lungs-clear to auscultation bilaterally, no wheezing or crackles auscultated Abdomen-soft, nontender, no organomegaly Extremities-no edema in the lower extremities Neuro-alert, oriented x3, no focal deficit noted   Status is: Inpatient:             Kalan Rinn S Aziya Arena   [facility] If 7PM-7AM, please contact night-coverage at [email or web address], Office  [phone]   03/04/2023, 4:14 PM  LOS: 3 days

## 2023-03-04 NOTE — Plan of Care (Signed)

## 2023-03-04 NOTE — Consult Note (Signed)
[facility] Renal Consultation Note  Requesting MD: [place] Indication for Consultation: AKI  HPI:  Gerald Mills is a 42 y.o. 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  got in an argument with his mother so was brought back to the ER for possible IVC-  found again to be in AKI-  this time CK of 12K up to 18K-  urine is actually bland- renal US shows good sized kidneys but some comment of inc echogenicity-  crt is already getting better-  on presentation was 2.69 and is 1.73 today. BP has stayed high-  having good UOP-  was on ARB/HCT combo drug as OP resumed at discharge-  now here on norvasc and hydralazine.  He states right now that he feels fine.  Mother is at bedside she is concerned about the drug use  Creatinine, Ser  Date/Time Value Ref Range Status  03/04/2023 09:14 AM 1.73 (H) 0.61 - 1.24 mg/dL Final  16/08/9603 54:09 AM 2.51 (H) 0.61 - 1.24 mg/dL Final  81/19/1478 29:56 AM 2.48 (H) 0.61 - 1.24 mg/dL Final  21/30/8657 84:69 PM 2.69 (H) 0.61 - 1.24 mg/dL Final  62/95/2841 32:44 AM 1.12 0.61 - 1.24 mg/dL Final  11/10/7251 66:44 AM 1.13 0.61 - 1.24 mg/dL Final  03/47/4259 56:38 AM 1.39 (H) 0.61 - 1.24 mg/dL Final  75/64/3329 51:88 AM 1.76 (H) 0.61 - 1.24 mg/dL Final  41/66/0630 16:01 PM 2.43 (H) 0.61 - 1.24 mg/dL Final  09/32/3557 32:20 PM 2.67 (H) 0.61 - 1.24 mg/dL Final  25/42/7062 37:62 PM 0.99 0.61 - 1.24 mg/dL Final  83/15/1761 60:73 PM 1.23 0.76 - 1.27 mg/dL Final  71/04/2693 85:46 AM 0.87 0.76 - 1.27 mg/dL Final  27/01/5008 38:18 PM 1.00 0.61 - 1.24 mg/dL Final  29/93/7169 67:89 PM 1.00 0.61 - 1.24 mg/dL Final  38/08/1750 02:58 PM 1.1 0.4 - 1.5 mg/dL Final     PMHx:   Past Medical History:  Diagnosis Date    Hypertension     History reviewed. No pertinent surgical history.  Family Hx:  Family History  Problem Relation Age of Onset   Diabetes Mother    Hypertension Mother    Healthy Father    Hypertension Father     Social History:  reports that he has been smoking cigarettes. He has a 12.00 pack-year smoking history. He has never used smokeless tobacco. He reports current alcohol use. He reports that he does not use drugs.  Allergies: No Known Allergies  Medications: Prior to Admission medications   Medication Sig Start Date End Date Taking? Authorizing Provider  amLODipine (NORVASC) 10 MG tablet Take 1 tablet (10 mg total) by mouth daily. 03/02/23 04/01/23  Lanae Boast, MD  escitalopram (LEXAPRO) 10 MG tablet Take 1 tablet (10 mg total) by mouth daily. Patient not taking: Reported on 02/25/2023 03/16/21   Mirian Mo, MD  hydrOXYzine (ATARAX) 25 MG tablet Take 1 tablet (25 mg total) by mouth every 6 (six) hours as needed for up to 15 days for anxiety. 03/01/23 03/16/23  Lanae Boast, MD  losartan-hydrochlorothiazide (HYZAAR) 100-25 MG tablet Take 1 tablet by mouth daily. 03/01/23 03/31/23  Lanae Boast, MD  furosemide (LASIX) 20 MG tablet Take 1 tablet (20 mg total) by mouth  daily for 7 days. 09/25/20 03/03/21  Bing Neighbors, NP  hydrochlorothiazide (HYDRODIURIL) 25 MG tablet Take 1 tablet (25 mg total) by mouth daily. 04/04/17 08/25/19  Coralyn Mark, NP  lisinopril-hydrochlorothiazide (ZESTORETIC) 10-12.5 MG tablet Take 1 tablet by mouth daily. 05/21/19 08/25/19  Mickie Bail, NP    I have reviewed the patient's current medications.  Labs:  Results for orders placed or performed during the hospital encounter of 03/01/23 (from the past 48 hour(s))  Comprehensive metabolic panel     Status: Abnormal   Collection Time: 03/03/23  6:04 AM  Result Value Ref Range   Sodium 136 135 - 145 mmol/L   Potassium 3.5 3.5 - 5.1 mmol/L   Chloride 98 98 - 111 mmol/L   CO2 28 22 - 32 mmol/L    Glucose, Bld 110 (H) 70 - 99 mg/dL    Comment: Glucose reference range applies only to samples taken after fasting for at least 8 hours.   BUN 44 (H) 6 - 20 mg/dL   Creatinine, Ser 1.61 (H) 0.61 - 1.24 mg/dL   Calcium 8.4 (L) 8.9 - 10.3 mg/dL   Total Protein 6.1 (L) 6.5 - 8.1 g/dL   Albumin 3.2 (L) 3.5 - 5.0 g/dL   AST 096 (H) 15 - 41 U/L   ALT 97 (H) 0 - 44 U/L   Alkaline Phosphatase 62 38 - 126 U/L   Total Bilirubin 0.5 0.3 - 1.2 mg/dL   GFR, Estimated 32 (L) >60 mL/min    Comment: (NOTE) Calculated using the CKD-EPI Creatinine Equation (2021)    Anion gap 10 5 - 15    Comment: Performed at [facility], [address] [place], [place] [address]  CBC     Status: Abnormal   Collection Time: 03/03/23  6:04 AM  Result Value Ref Range   WBC 7.5 4.0 - 10.5 K/uL   RBC 4.31 4.22 - 5.81 MIL/uL   Hemoglobin 12.8 (L) 13.0 - 17.0 g/dL   HCT 98.1 19.1 - 47.8 %   MCV 91.6 80.0 - 100.0 fL   MCH 29.7 26.0 - 34.0 pg   MCHC 32.4 30.0 - 36.0 g/dL   RDW 29.5 62.1 - 30.8 %   Platelets 223 150 - 400 K/uL   nRBC 0.0 0.0 - 0.2 %    Comment: Performed at [facility], [address] [place], [place] [address]  CK     Status: Abnormal   Collection Time: 03/03/23  6:04 AM  Result Value Ref Range   Total CK 7,936 (H) 49 - 397 U/L    Comment: RESULT CONFIRMED BY MANUAL DILUTION Performed at [facility], [address], [place], [place] [address]   Urinalysis, Routine w reflex microscopic -Urine, Clean Catch     Status: Abnormal   Collection Time: 03/03/23  6:46 PM  Result Value Ref Range   Color, Urine COLORLESS (A) YELLOW   APPearance CLEAR CLEAR   Specific Gravity, Urine 1.005 1.005 - 1.030   pH 6.0 5.0 - 8.0   Glucose, UA 50 (A) NEGATIVE mg/dL   Hgb urine dipstick SMALL (A) NEGATIVE   Bilirubin Urine NEGATIVE NEGATIVE   Ketones, ur NEGATIVE NEGATIVE mg/dL   Protein, ur NEGATIVE NEGATIVE mg/dL   Nitrite NEGATIVE NEGATIVE    Leukocytes,Ua NEGATIVE NEGATIVE   RBC / HPF 0-5 0 - 5 RBC/hpf   WBC, UA 0-5 0 - 5 WBC/hpf   Bacteria, UA NONE SEEN NONE SEEN  Squamous Epithelial / HPF 0-5 0 - 5 /HPF    Comment: Performed at [facility], [address], [place], [place] [address]  Protein / creatinine ratio, urine     Status: None   Collection Time: 03/03/23  6:47 PM  Result Value Ref Range   Creatinine, Urine 31 mg/dL   Total Protein, Urine <6 mg/dL    Comment: NO NORMAL RANGE ESTABLISHED FOR THIS TEST   Protein Creatinine Ratio        0.00 - 0.15 mg/mg[Cre]    Comment: RESULT BELOW REPORTABLE RANGE, UNABLE TO CALCULATE. Performed at [facility], [address], [place], [place] [address]   Comprehensive metabolic panel     Status: Abnormal   Collection Time: 03/04/23  9:14 AM  Result Value Ref Range   Sodium 140 135 - 145 mmol/L   Potassium 3.8 3.5 - 5.1 mmol/L   Chloride 104 98 - 111 mmol/L   CO2 27 22 - 32 mmol/L   Glucose, Bld 142 (H) 70 - 99 mg/dL    Comment: Glucose reference range applies only to samples taken after fasting for at least 8 hours.   BUN 30 (H) 6 - 20 mg/dL   Creatinine, Ser 0.98 (H) 0.61 - 1.24 mg/dL   Calcium 8.6 (L) 8.9 - 10.3 mg/dL   Total Protein 6.4 (L) 6.5 - 8.1 g/dL   Albumin 3.0 (L) 3.5 - 5.0 g/dL   AST 119 (H) 15 - 41 U/L   ALT 88 (H) 0 - 44 U/L   Alkaline Phosphatase 53 38 - 126 U/L   Total Bilirubin 0.6 0.3 - 1.2 mg/dL   GFR, Estimated 50 (L) >60 mL/min    Comment: (NOTE) Calculated using the CKD-EPI Creatinine Equation (2021)    Anion gap 9 5 - 15    Comment: Performed at [facility], [address], [place], [place] [address]  CK     Status: Abnormal   Collection Time: 03/04/23  9:14 AM  Result Value Ref Range   Total CK 4,591 (H) 49 - 397 U/L    Comment: RESULT CONFIRMED BY MANUAL DILUTION Performed at [facility], [address] [place], [place] [address]      ROS:  A  comprehensive review of systems was negative.  Physical Exam: Vitals:   03/04/23 0400 03/04/23 1404  BP: (!) 168/98 (!) 178/109  Pulse: (!) 53 (!) 54  Resp: 20 18  Temp: 98 F (36.7 C) 98.2 F (36.8 C)  SpO2: 99% 98%     General: Well-appearing black male-no acute distress-defensive about behaviors HEENT: Pupils equal round reactive to light, extraocular motions intact, mucous membranes moist Neck: No JVD Heart: Actually bradycardic Lungs: Clear to auscultation bilaterally Abdomen: Soft, nontender, nondistended Extremities: Trace edema Skin: Warm and dry Neuro: Alert and nonfocal  Assessment/Plan: 43 year old black male with hypertension-not controlled.  He has had 2 back-to-back hospitalizations where he suffered AKI 1.Renal-this is the second episode of AKI in the last month.  His first episode did resolve pretty quickly.  Now with his second episode-it has been slower to resolve but is starting to.  His urinalysis is pretty bland-he has no proteinuria so likely a little in the way of chronic kidney injury.  his ultrasound is not really remarkable even though increased echogenicity was commented on.  The way that I put this together is that if he is hypertensive on an ACE/ARB... Then takes several oxycodone that will likely decrease  his blood pressure enough that he could get AKI.  This also could be the etiology of his elevated CK/rhabdo.  The rhabdo is more severe at this second time around.   Since his creatinine and CK have improved and he has developed a little bit of edema I will go ahead and stop the fluids but encourage still oral hydration for this acute kidney injury 2. Hypertension/volume  -this is the main issue.  I agree that in theory for an ARB for him.  However, given his lifestyle his blood pressure might be too variable for him to get along well on this drug.  I agree completely with keeping him on the Norvasc-and using the hydralazine as a as needed.  His bradycardia  would be an issue for beta-blocker.  HCTZ could be appropriate once his rhabdo is resolved.  3. Dispo-I attempted to explain to him that it is of paramount importance that he get his blood pressure under control.  I also explained that is not good to have 2 episodes of fairly severe acute kidney injury in such a short period of time.  If this pattern continues he could do permanent damage to his kidneys   Cecille Aver 03/04/2023, 2:36 PM

## 2023-03-04 NOTE — TOC Initial Note (Signed)
Transition of Care [facility]) - Initial/Assessment Note    Patient Details  Name: Gerald Mills MRN: [id number] Date of Birth: [birth year]  [facility]) CM/SW Contact:    Howell Rucks, RN Phone Number: 03/04/2023, 2:32 PM  Clinical Narrative:  [facility] referral for substance use resources. Met with pt and family in room, pt agreeable to substance use resources. Pt  and family requested assistance with applying for Medicaid, NCM call to SW, confirmed pt is Potential Medicaid status meaning pt has been screened for Medicaid and to follow up with local DSS, pt and family verbalized understanding. No further TOC needs identified at this time.                   Expected Discharge Plan: Home/Self Care Barriers to Discharge: Continued Medical Work up   Patient Goals and CMS Choice Patient states their goals for this hospitalization and ongoing recovery are:: Home with family CMS Medicare.gov Compare Post Acute Care list provided to:: Patient Choice offered to / list presented to : Patient [place] ownership interest in [facility].provided to:: Patient    Expected Discharge Plan and Services   Discharge Planning Services: CM Consult   Living arrangements for the past 2 months: Single Family Home                                      Prior Living Arrangements/Services Living arrangements for the past 2 months: Single Family Home Lives with:: Relatives Patient language and need for interpreter reviewed:: Yes Do you feel safe going back to the place where you live?: Yes      Need for Family Participation in Patient Care: Yes (Comment) Care giver support system in place?: Yes (comment)   Criminal Activity/Legal Involvement Pertinent to Current Situation/Hospitalization: No - Comment as needed  Activities of Daily Living Home Assistive Devices/Equipment: None ADL Screening (condition at time of admission) Patient's cognitive ability adequate to safely complete  daily activities?: No Is the patient deaf or have difficulty hearing?: No Does the patient have difficulty seeing, even when wearing glasses/contacts?: No Does the patient have difficulty concentrating, remembering, or making decisions?: No Patient able to express need for assistance with ADLs?: Yes Does the patient have difficulty dressing or bathing?: No Independently performs ADLs?: Yes (appropriate for developmental age) Does the patient have difficulty walking or climbing stairs?: No Weakness of Legs: None Weakness of Arms/Hands: None  Permission Sought/Granted Permission sought to share information with : Case Manager Permission granted to share information with : Yes, Verbal Permission Granted  Share Information with NAME: Fannie Knee, RN           Emotional Assessment Appearance:: Appears stated age Attitude/Demeanor/Rapport: Gracious Affect (typically observed): Accepting Orientation: : Oriented to Self, Oriented to Place, Oriented to  Time, Oriented to Situation Alcohol / Substance Use: Alcohol Use, Illicit Drugs, Tobacco Use Psych Involvement: Yes (comment)  Admission diagnosis:  Acute renal failure (ARF) (HCC) [N17.9] Acute kidney injury (HCC) [N17.9] Patient Active Problem List   Diagnosis Date Noted   Rhabdomyolysis 03/01/2023   Polysubstance abuse (HCC) 03/01/2023   Involuntary commitment 03/01/2023   Hypocalcemia 03/01/2023   Acute renal failure (ARF) (HCC) 03/01/2023   Opioid use disorder, moderate, dependence (HCC) 02/27/2023   Adjustment disorder with depressed mood 02/27/2023   Nausea and vomiting 02/26/2023   Acute renal failure (HCC) 02/26/2023   Hypokalemia 02/26/2023  Opioid use disorder 02/25/2023   Opiate abuse, continuous (HCC) 09/08/2021   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   Mood disorder (HCC) 03/16/2021   Pain of left calf 10/08/2020   History of snoring 05/28/2020   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 [place] [address] Phone: [phone] Fax: [phone]     Social Determinants of Health (SDOH) Social History: SDOH Screenings   Food Insecurity: No Food Insecurity (03/02/2023)  Housing: Low Risk  (03/02/2023)  Transportation Needs: No Transportation Needs (03/02/2023)  Utilities: Not At Risk (03/02/2023)  Depression (PHQ2-9): High Risk (02/25/2023)  Tobacco Use: High Risk (03/02/2023)   SDOH Interventions:     Readmission Risk Interventions    03/01/2023   10:24 AM  Readmission Risk Prevention Plan  Transportation Screening Complete  PCP or Specialist Appt within 5-7 Days Complete  Home Care Screening Complete  Medication Review (RN CM) Referral to Pharmacy

## 2023-03-05 LAB — RENAL FUNCTION PANEL
Albumin: 3.4 g/dL — ABNORMAL LOW (ref 3.5–5.0)
Anion gap: 11 (ref 5–15)
BUN: 27 mg/dL — ABNORMAL HIGH (ref 6–20)
CO2: 27 mmol/L (ref 22–32)
Calcium: 8.9 mg/dL (ref 8.9–10.3)
Chloride: 101 mmol/L (ref 98–111)
Creatinine, Ser: 1.56 mg/dL — ABNORMAL HIGH (ref 0.61–1.24)
GFR, Estimated: 57 mL/min — ABNORMAL LOW (ref >60)
Glucose, Bld: 131 mg/dL — ABNORMAL HIGH (ref 70–99)
Phosphorus: 3.5 mg/dL (ref 2.5–4.6)
Potassium: 3.6 mmol/L (ref 3.5–5.1)
Sodium: 139 mmol/L (ref 135–145)

## 2023-03-05 LAB — CK: Total CK: 3300 U/L — ABNORMAL HIGH (ref 49–397)

## 2023-03-05 MED ORDER — HYDROCHLOROTHIAZIDE 25 MG PO TABS: 25.0000 mg | ORAL_TABLET | Freq: Every morning | ORAL | 3 refills | Status: DC

## 2023-03-05 MED ORDER — HYDRALAZINE HCL 10 MG PO TABS: 10.0000 mg | ORAL_TABLET | Freq: Two times a day (BID) | ORAL | 3 refills | Status: DC

## 2023-03-05 MED ORDER — AMLODIPINE BESYLATE 10 MG PO TABS: 10.0000 mg | ORAL_TABLET | Freq: Every evening | ORAL | 3 refills | Status: DC

## 2023-03-05 MED ORDER — ESCITALOPRAM OXALATE 10 MG PO TABS: 10.0000 mg | ORAL_TABLET | Freq: Every day | ORAL | 3 refills | Status: DC

## 2023-03-05 MED ORDER — SILDENAFIL CITRATE 50 MG PO TABS: 50.0000 mg | ORAL_TABLET | Freq: Every day | ORAL | 0 refills | Status: DC | PRN

## 2023-03-05 NOTE — Discharge Summary (Signed)
Physician Discharge Summary   Patient: Gerald Mills MRN: [id number] DOB: Mar 23, 1981  Admit date:     03/01/2023  Discharge date: 03/05/23  Discharge Physician: Meredeth Ide   PCP: Pcp, No   Recommendations at discharge:   Follow-up nephrology as outpatient  Discharge Diagnoses: Principal Problem:   Acute renal failure (HCC) Active Problems:   Rhabdomyolysis   Hypertension   Mood disorder (HCC)   Hypokalemia   Polysubstance abuse (HCC)   Involuntary commitment   Hypocalcemia  Resolved Problems:   * No resolved hospital problems. *  Hospital Course:  42 year old male with medical history of hypertension, mood disorder, tobacco and opioid abuse who was initially admitted as  IVC by mother for agitation.  Patient was just discharged for acute kidney injury, sinus bradycardia, electrolyte imbalance, mild rhabdomyolysis with [id number], opioid withdrawal.  Brought to hospital after patient had argument with his mother In the ED he was hypertensive with blood pressure 160/119.  Hypokalemia with potassium 3.3, creatinine 2.69 [place] [address], AST elevated 203, ALT 73.  UDS positive for opioids and benzodiazepine.  EtOH level less than 10.  Assessment and Plan:  Acute kidney injury -Presented with worsening creatinine 2.69, GFR less than 30 -Baseline creatinine around 1.0, 1 year ago.  Creatinine has improved to 1.73 -Also has elevated CK 12,457 -Ordered to get  IV fluids, LR at 125 mL/h; however initially patient was refusing treatment, wanted to leave AMA.  -After discussion agreed to stay and get IV fluids.  -Repeat CK was 18 852 -Started on LR at 125 mL/h.  -This morning BUN/creatinine is down to 27/1.56 -CK is down to 3300 without IV fluids. -Discussed with nephrology, renal ultrasound obtained which showed no hydronephrosis. -Nephrology recommends to discharge home, they will follow patient as outpatient.       Hypertension -Blood pressure is elevated -Started on   amlodipine 10 mg daily -Start hydrochlorothiazide 25 mg daily -Start hydralazine 10 mg p.o. twice daily   Rhabdomyolysis -Likely in setting of drug abuse, agitation -He has been refusing IV fluids last night as he has been refusing IV -CK level obtained today is elevated at 18, 852 -Discussed with patient the need to restart IV fluids as patient has been refusing since last night. -Explained that rhabdomyolysis can worsen the renal function; and he needs IV fluids for that. -Agreed to get IV fluids.  Started on  LR at 125 mL/h.Now stopped per nephrology -CK is down to 3300 with oral hydration -Nephrology recommends to discharge home, they will follow-up as outpatient   Transaminitis -Elevated AST and ALT, likely in setting of rhabdomyolysis as above -LFTs have been improving -Follow LFTs as outpatient      Polysubstance abuse -UDS positive for benzodiazepines and opioids -Offered counseling  -He refused to see social work for outpatient rehab   Hypokalemia -Replete   Mood disorder -pt under IVC by mother for agitation. Currently calm and cooperative although may need sitter if he becomes agitated.  -psych was consulted in earlier admission. -continue Lexapro -[facility], [facility]ry was consulted  -for decision-making capacity.   IVC has been rescinded.                Consultants: Nephrology Procedures performed:  Disposition: Home Diet recommendation:  Discharge Diet Orders (From admission, onward)     Start     Ordered   03/05/23 0000  Diet - low sodium heart healthy        03/05/23 1407  Cardiac diet DISCHARGE MEDICATION: Allergies as of 03/05/2023   No Known Allergies      Medication List     STOP taking these medications    losartan-hydrochlorothiazide 100-25 MG tablet Commonly known as: Hyzaar       TAKE these medications    amLODipine 10 MG tablet Commonly known as: NORVASC Take 1 tablet (10 mg total) by mouth  every evening. What changed: when to take this   escitalopram 10 MG tablet Commonly known as: Lexapro Take 1 tablet (10 mg total) by mouth daily.   hydrALAZINE 10 MG tablet Commonly known as: APRESOLINE Take 1 tablet (10 mg total) by mouth 2 (two) times daily.   hydrochlorothiazide 25 MG tablet Commonly known as: HYDRODIURIL Take 1 tablet (25 mg total) by mouth every morning.   hydrOXYzine 25 MG tablet Commonly known as: ATARAX Take 1 tablet (25 mg total) by mouth every 6 (six) hours as needed for up to 15 days for anxiety.   sildenafil 50 MG tablet Commonly known as: Viagra Take 1 tablet (50 mg total) by mouth daily as needed for erectile dysfunction (Do not use more than one dose per day).        Follow-up Information     Annie Sable, MD. Schedule an appointment as soon as possible for a visit.   Specialty: Nephrology Contact information: [address] [place] [address] [phone]                Discharge Exam: Ceasar Mons Weights   03/01/23 1908  Weight: 115.2 kg   General-appears in no acute distress Heart-S1-S2, regular, no murmur auscultated Lungs-clear to auscultation bilaterally, no wheezing or crackles auscultated Abdomen-soft, nontender, no organomegaly Extremities-no edema in the lower extremities Neuro-alert, oriented x3, no focal deficit noted  Condition at discharge: good  The results of significant diagnostics from this hospitalization (including imaging, microbiology, ancillary and laboratory) are listed below for reference.   Imaging Studies: US RENAL  Result Date: 03/03/2023 CLINICAL DATA:  Acute kidney injury EXAM: RENAL / URINARY TRACT ULTRASOUND COMPLETE COMPARISON:  None available FINDINGS: Right Kidney: Renal measurements: 14.1 x 5.6 x 7.6 cm = volume: 314 mL. Mild increased echogenicity the renal cortex without significant thinning. No mass or hydronephrosis visualized. Left Kidney: Renal measurements: 12.9 x 7.0 x 6.6 cm =  volume: 309 mL. Mild increased echogenicity the renal cortex without significant thinning. No mass or hydronephrosis visualized. Bladder: Appears normal for degree of bladder distention. Other: None. IMPRESSION: Increased cortical echogenicity consistent with medical renal disease. No hydronephrosis. Electronically Signed   By: Acquanetta Belling M.D.   On: 03/03/2023 16:36   DG Knee 1-2 Views Right  Result Date: 02/26/2023 CLINICAL DATA:  42 year old male with history of bilateral knee pain. EXAM: RIGHT KNEE - 1-2 VIEW COMPARISON:  No priors. FINDINGS: Two views of the right knee demonstrate no acute displaced fracture, subluxation or dislocation. There is multifocal joint space narrowing and subchondral sclerosis, most evident in the medial and patellofemoral compartments, indicative of mild osteoarthritis. IMPRESSION: 1. No acute radiographic abnormality of the right knee. 2. Mild osteoarthritis, as above. Electronically Signed   By: Trudie Reed M.D.   On: 02/26/2023 06:30   DG Knee 1-2 Views Left  Result Date: 02/26/2023 CLINICAL DATA:  42 year old male with history of bilateral knee pain. EXAM: LEFT KNEE - 1-2 VIEW COMPARISON:  No priors. FINDINGS: Two views of the left knee demonstrate no acute displaced fracture, subluxation or dislocation. There is joint space narrowing, subchondral  sclerosis and osteophyte formation noted, most evident in the medial compartment. IMPRESSION: 1. No acute radiographic abnormality of the left knee. 2. 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 Electronically Signed   By: Trudie Reed M.D.   On: 02/26/2023 06:29    Microbiology: Results for orders placed or performed during the hospital encounter of 02/25/23  SARS Coronavirus 2 by RT PCR (hospital order, performed in [facility] hospital lab) *cepheid single result test* Anterior Nasal Swab     Status: None   Collection Time: 02/25/23  5:35 PM   Specimen: Anterior Nasal Swab  Result Value Ref Range  Status   SARS Coronavirus 2 by RT PCR NEGATIVE NEGATIVE Final    Comment: Performed at [facility], [address] [place], [place] [address]    Labs: CBC: Recent Labs  Lab 03/01/23 1956 03/03/23 0604  WBC 10.3 7.5  NEUTROABS 8.3*  --   HGB 15.0 12.8*  HCT 46.0 39.5  MCV 91.5 91.6  PLT 221 223   Basic Metabolic Panel: Recent Labs  Lab 02/27/23 0100 02/27/23 2011 02/28/23 0623 02/28/23 1306 03/01/23 0616 03/01/23 1956 03/02/23 0951 03/03/23 0604 03/04/23 0914 03/05/23 0726  NA 137  --  136  --  137 136 133* 136 140 139  K 3.3*  --  3.1*   < > 3.6 3.3* 4.3 3.5 3.8 3.6  CL 102  --  97*  --  101 100 95* 98 104 101  CO2 27  --  26  --  27 23 24 28 27 27   GLUCOSE 185*  --  135*  --  116* 139* 186* 110* 142* 131*  BUN 21*  --  11  --  13 24* 35* 44* 30* 27*  CREATININE 1.39*  --  1.13  --  1.12 2.69* 2.48* 2.51* 1.73* 1.56*  CALCIUM 8.5*  --  9.1  --  8.7* 8.8* 9.3 8.4* 8.6* 8.9  MG 1.7  --   --   --  1.8  --   --   --   --   --   PHOS 1.4* 1.3* 1.9*  --  2.6  --   --   --   --  3.5   < > = values in this interval not displayed.   Liver Function Tests: Recent Labs  Lab 02/27/23 0100 03/01/23 1956 03/02/23 0951 03/03/23 0604 03/04/23 0914 03/05/23 0726  AST 32 203* 354* 175* 134*  --   ALT 31 73* 127* 97* 88*  --   ALKPHOS 62 73 76 62 53  --   BILITOT 1.2 1.1 1.0 0.5 0.6  --   PROT 6.1* 8.2* 7.6 6.1* 6.4*  --   ALBUMIN 3.4* 4.1 3.9 3.2* 3.0* 3.4*   CBG: No results for input(s): "GLUCAP" in the last 168 hours.  Discharge time spent: greater than 30 minutes.  Signed: Meredeth Ide, MD [facility] 03/05/2023

## 2023-03-05 NOTE — Progress Notes (Signed)
Subjective:  looks good-  about to take a shower.  He seems to be trying to indicate to me erectile dysfunction.  Creatinine and CK are better on only oral hydration.  Blood pressure 160- 170 systolic over 90s Objective Vital signs in last 24 hours: Vitals:   03/04/23 0400 03/04/23 1404 03/04/23 2045 03/05/23 0626  BP: (!) 168/98 (!) 178/109 (!) 160/97 (!) 174/91  Pulse: (!) 53 (!) 54 (!) 53 (!) 51  Resp: 20 18 18 18   Temp: 98 F (36.7 C) 98.2 F (36.8 C) 98.6 F (37 C) 98.5 F (36.9 C)  TempSrc: Oral Oral Oral Oral  SpO2: 99% 98% 99% 100%  Weight:      Height:       Weight change:   Intake/Output Summary (Last 24 hours) at 03/05/2023 1133 Last data filed at 03/04/2023 1908 Gross per 24 hour  Intake 1737.5 ml  Output 900 ml  Net 837.5 ml    Assessment/Plan: 42 year old black male with hypertension-not controlled.  He has had 2 back-to-back hospitalizations where he suffered AKI 1.Renal-this is the second episode of AKI in the last month.  His first episode did resolve pretty quickly.  Now with his second episode-it has been slower to resolve but is starting to.  His urinalysis is pretty bland-he has no proteinuria so likely very little in the way of chronic kidney injury.  his ultrasound is not really remarkable even though increased echogenicity was commented on.  The way that I put this together is that if he is hypertensive on an ACE/ARB... Then takes several oxycodone that will likely decrease his blood pressure enough that he could get AKI.  This also could be the etiology of his elevated CK/rhabdo.  The rhabdo is more severe at this second time around.   Since his creatinine and CK have improved and he has developed a little bit of edema I will go ahead and stop the fluids but encourage still oral hydration for this acute kidney injury.  Kidney function is better still today.  I would be comfortable with discharge and outpatient follow-up on this 2. Hypertension/volume  -this is the  main issue.  I agree that in theory for an ARB for him.  However, given his lifestyle his blood pressure might be too variable for him to get along well on this drug.  I agree completely with keeping him on the Norvasc-and using the hydralazine  as needed.  His bradycardia would be an issue for beta-blocker.  HCTZ could be appropriate once his rhabdo is resolved.   I would send him out on amlodipine 10 mg in the evening, HCTZ 25 mg in the morning, and hydralazine 10 mg twice daily.(He can take the hydralazine along with the morning med in the evening med) I will make an appointment with me to continue to fine-tune his blood pressure control and to make sure that his AKI resolves completely 3. Dispo-I attempted to explain to him that it is of paramount importance that he get his blood pressure under control.  I also explained that is not good to have 2 episodes of fairly severe acute kidney injury in such a short period of time.  If this pattern continues he could do permanent damage to his kidneys 4.  ED-despite he seems to be describing to me.  This can be a difficult situation for young men on blood pressure medications.  However, I have specifically chosen medicines that are not notorious for causing ED for his  discharge regimen.  If the primary would be so kind is to give him a prescription for as needed Viagra at discharge that would be great     Cecille Aver    Labs: Basic Metabolic Panel: Recent Labs  Lab 02/28/23 0623 02/28/23 1306 03/01/23 0616 03/01/23 1956 03/03/23 0604 03/04/23 0914 03/05/23 0726  NA 136  --  137   < > 136 140 139  K 3.1*   < > 3.6   < > 3.5 3.8 3.6  CL 97*  --  101   < > 98 104 101  CO2 26  --  27   < > 28 27 27   GLUCOSE 135*  --  116*   < > 110* 142* 131*  BUN 11  --  13   < > 44* 30* 27*  CREATININE 1.13  --  1.12   < > 2.51* 1.73* 1.56*  CALCIUM 9.1  --  8.7*   < > 8.4* 8.6* 8.9  PHOS 1.9*  --  2.6  --   --   --  3.5   < > = values in this  interval not displayed.   Liver Function Tests: Recent Labs  Lab 03/02/23 0951 03/03/23 0604 03/04/23 0914 03/05/23 0726  AST 354* 175* 134*  --   ALT 127* 97* 88*  --   ALKPHOS 76 62 53  --   BILITOT 1.0 0.5 0.6  --   PROT 7.6 6.1* 6.4*  --   ALBUMIN 3.9 3.2* 3.0* 3.4*   Recent Labs  Lab 02/27/23 0100  LIPASE 32   No results for input(s): "AMMONIA" in the last 168 hours. CBC: Recent Labs  Lab 03/01/23 1956 03/03/23 0604  WBC 10.3 7.5  NEUTROABS 8.3*  --   HGB 15.0 12.8*  HCT 46.0 39.5  MCV 91.5 91.6  PLT 221 223   Cardiac Enzymes: Recent Labs  Lab 03/01/23 1956 03/02/23 0951 03/03/23 0604 03/04/23 0914 03/05/23 0726  CKTOTAL 12,457* 16,109* 7,936* 4,591* 3,300*   CBG: No results for input(s): "GLUCAP" in the last 168 hours.  Iron Studies: No results for input(s): "IRON", "TIBC", "TRANSFERRIN", "FERRITIN" in the last 72 hours. Studies/Results: US RENAL  Result Date: 03/03/2023 CLINICAL DATA:  Acute kidney injury EXAM: RENAL / URINARY TRACT ULTRASOUND COMPLETE COMPARISON:  None available FINDINGS: Right Kidney: Renal measurements: 14.1 x 5.6 x 7.6 cm = volume: 314 mL. Mild increased echogenicity the renal cortex without significant thinning. No mass or hydronephrosis visualized. Left Kidney: Renal measurements: 12.9 x 7.0 x 6.6 cm = volume: 309 mL. Mild increased echogenicity the renal cortex without significant thinning. No mass or hydronephrosis visualized. Bladder: Appears normal for degree of bladder distention. Other: None. IMPRESSION: Increased cortical echogenicity consistent with medical renal disease. No hydronephrosis. Electronically Signed   By: Acquanetta Belling M.D.   On: 03/03/2023 16:36   Medications: Infusions:   Scheduled Medications:  amLODipine  10 mg Oral Daily   escitalopram  10 mg Oral Daily   folic acid  1 mg Oral Daily   multivitamin with minerals  1 tablet Oral Daily   thiamine  100 mg Oral Daily   Or   thiamine  100 mg Intravenous  Daily    have reviewed scheduled and prn medications.  Physical Exam: General: No acute distress Heart: Regular rate and rhythm Lungs: Clear Abdomen: Soft, nontender Extremities: No edema    03/05/2023,11:33 AM  LOS: 4 days

## 2023-03-07 LAB — OPIATE, QUANTITATIVE, URINE
Codeine: NEGATIVE
Hydrocodone: NEGATIVE
Hydromorphone: NEGATIVE
Morphine, Confirm: 614 ng/mL
Morphine: POSITIVE — AB
OXYCODONE+OXYMORPHONE UR QL SCN: NEGATIVE
Opiates: POSITIVE — AB

## 2023-03-22 ENCOUNTER — Ambulatory Visit: Payer: Self-pay | Attending: Nurse Practitioner | Admitting: Nurse Practitioner

## 2023-03-24 ENCOUNTER — Encounter (HOSPITAL_COMMUNITY): Payer: Self-pay | Admitting: Emergency Medicine

## 2023-03-24 ENCOUNTER — Ambulatory Visit (HOSPITAL_COMMUNITY)
Admission: EM | Admit: 2023-03-24 | Discharge: 2023-03-24 | Disposition: A | Discharge status: Home / Self Care | Payer: Self-pay | Attending: Urgent Care | Admitting: Urgent Care

## 2023-03-24 DIAGNOSIS — I10 Essential (primary) hypertension: Secondary | ICD-10-CM

## 2023-03-24 DIAGNOSIS — F4329 Adjustment disorder with other symptoms: Secondary | ICD-10-CM

## 2023-03-24 DIAGNOSIS — F17210 Nicotine dependence, cigarettes, uncomplicated: Secondary | ICD-10-CM

## 2023-03-24 DIAGNOSIS — M542 Cervicalgia: Secondary | ICD-10-CM

## 2023-03-24 MED ORDER — BUPROPION HCL ER (SR) 150 MG PO TB12: 150.0000 mg | ORAL_TABLET | Freq: Every day | ORAL | 0 refills | Status: DC

## 2023-03-24 MED ORDER — KETOROLAC TROMETHAMINE 30 MG/ML IJ SOLN
30.0000 mg | Freq: Once | INTRAMUSCULAR | Status: AC
  Administered 2023-03-24: 30 mg via INTRAMUSCULAR

## 2023-03-24 MED ORDER — BACLOFEN 10 MG PO TABS: 10.0000 mg | ORAL_TABLET | Freq: Three times a day (TID) | ORAL | 0 refills | Status: DC

## 2023-03-24 MED ORDER — KETOROLAC TROMETHAMINE 30 MG/ML IJ SOLN
INTRAMUSCULAR | Status: AC
  Filled 2023-03-24: qty 1

## 2023-03-24 MED ORDER — BUSPIRONE HCL 10 MG PO TABS: ORAL_TABLET | ORAL | 0 refills | Status: DC

## 2023-03-24 NOTE — ED Triage Notes (Signed)
Pt reports having pain and pressure in right side fo neck for a couple days. Reports pain worse when rotating head to the right and looking down.  Took tylenol.

## 2023-03-24 NOTE — ED Provider Notes (Addendum)
[facility]    CSN: [id number] Arrival date & time: 03/24/23  4098      History   Chief Complaint Chief Complaint  Patient presents with   Neck Pain    HPI Gerald Mills is a 42 y.o. male.   Pleasant 42 year old male patient presents today with primary concern of neck pain.  He states he has had some neck tension for the past several days, but woke up with new pain in his right posterior shoulder near his scapula just this morning.  He has never had pain such as this. It is worse with rotation to the L, feels "tight." Denies any anterior neck pain, no tinnitus. He reports over the past several months going through a divorce of 20 years, and starting over.  He just recently moved to [facility] from [place] to do so.  He is about to start a new job.  Endorses some depressive and anxiety symptoms.  States he has been trying to meditate and workout to help with his stress levels.  He does smoke.  He used to be on Lexapro, but is not currently taking.  Denies SI HI, but does report some isolation. We did discuss his blood pressure.  Patient has a known history of hypertension.  He has 3 prescriptions of blood pressure medication at home, but patient states he has not been taking them for quite some time as he is "trying to lower it the healthy way".  He currently denies any headache apart from his neck pain, blurred vision, dizziness, tinnitus, chest pain, palpitations.    Neck Pain   Past Medical History:  Diagnosis Date   Hypertension     Patient Active Problem List   Diagnosis Date Noted   Rhabdomyolysis 03/01/2023   Polysubstance abuse (HCC) 03/01/2023   Involuntary commitment 03/01/2023   Hypocalcemia 03/01/2023   Acute renal failure (ARF) (HCC) 03/01/2023   Opioid use disorder, moderate, dependence (HCC) 02/27/2023   Adjustment disorder with depressed mood 02/27/2023   Nausea and vomiting 02/26/2023   Acute renal failure (HCC) 02/26/2023   Hypokalemia 02/26/2023    Opioid use disorder 02/25/2023   Opiate abuse, continuous (HCC) 09/08/2021   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   Mood disorder (HCC) 03/16/2021   Pain of left calf 10/08/2020   History of snoring 05/28/2020   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 No pertinent surgical history.     Home Medications    Prior to Admission medications   Medication Sig Start Date End Date Taking? Authorizing Provider  baclofen (LIORESAL) 10 MG tablet Take 1 tablet (10 mg total) by mouth 3 (three) times daily. As needed for muscle pain/ spasm 03/24/23  Yes Lucciano Vitali L, PA  buPROPion (WELLBUTRIN SR) 150 MG 12 hr tablet Take 1 tablet (150 mg total) by mouth daily. (Smoking cessation) 03/24/23  Yes Dajae Kizer L, PA  busPIRone (BUSPAR) 10 MG tablet Start 1/2 tab once daily x 4 days, take 1/2 tab twice daily x 4 days, then increase to 1 tab in the AM and 1/2 tab in the PM, then 1 tab PO BID (BP, stress, tension) 03/24/23  Yes Nishawn Rotan L, PA  amLODipine (NORVASC) 10 MG tablet Take 1 tablet (  10 mg total) by mouth every evening. 03/05/23 04/04/23  Meredeth Ide, MD  hydrALAZINE (APRESOLINE) 10 MG tablet Take 1 tablet (10 mg total) by mouth 2 (two) times daily. 03/05/23   Meredeth Ide, MD  hydrochlorothiazide (HYDRODIURIL) 25 MG tablet Take 1 tablet (25 mg total) by mouth every morning. 03/05/23   Meredeth Ide, MD  sildenafil (VIAGRA) 50 MG tablet Take 1 tablet (50 mg total) by mouth daily as needed for erectile dysfunction (Do not use more than one dose per day). 03/05/23   Meredeth Ide, MD  furosemide (LASIX) 20 MG tablet Take 1 tablet (20 mg total) by mouth daily for 7 days. 09/25/20 03/03/21  Bing Neighbors, NP   lisinopril-hydrochlorothiazide (ZESTORETIC) 10-12.5 MG tablet Take 1 tablet by mouth daily. 05/21/19 08/25/19  Mickie Bail, NP    Family History Family History  Problem Relation Age of Onset   Diabetes Mother    Hypertension Mother    Healthy Father    Hypertension Father     Social History Social History   Tobacco Use   Smoking status: Every Day    Packs/day: 0.50    Years: 24.00    Additional pack years: 0.00    Total pack years: 12.00    Types: Cigarettes   Smokeless tobacco: Never  Vaping Use   Vaping Use: Never used  Substance Use Topics   Alcohol use: Yes    Comment: occ   Drug use: Never     Allergies   Patient has no known allergies.   Review of Systems Review of Systems  Musculoskeletal:  Positive for neck pain.  As per HPI   Physical Exam Triage Vital Signs ED Triage Vitals  Enc Vitals Group     BP 03/24/23 1101 (!) 205/111     Pulse Rate 03/24/23 1101 (!) 50     Resp 03/24/23 1101 16     Temp 03/24/23 1101 97.6 F (36.4 C)     Temp Source 03/24/23 1101 Oral     SpO2 03/24/23 1101 98 %     Weight --      Height --      Head Circumference --      Peak Flow --      Pain Score 03/24/23 1100 9     Pain Loc --      Pain Edu? --      Excl. in GC? --    No data found.  Updated Vital Signs BP (!) 205/111 (BP Location: Right Arm) Comment: pt not taking HTN medications  Pulse (!) 50   Temp 97.6 F (36.4 C) (Oral)   Resp 16   SpO2 98%   Visual Acuity Right Eye Distance:   Left Eye Distance:   Bilateral Distance:    Right Eye Near:   Left Eye Near:    Bilateral Near:     Physical Exam Vitals and nursing note reviewed.  Constitutional:      General: He is not in acute distress.    Appearance: Normal appearance. He is well-developed and normal weight. He is not ill-appearing, toxic-appearing or diaphoretic.  HENT:     Head: Normocephalic and atraumatic.     Right Ear: External ear normal.     Left Ear: External ear normal.      Nose: Nose normal.     Mouth/Throat:     Mouth: Mucous membranes are moist.     Pharynx: Oropharynx is clear. No oropharyngeal exudate or  posterior oropharyngeal erythema.  Eyes:     General: No scleral icterus.       Right eye: No discharge.        Left eye: No discharge.     Extraocular Movements: Extraocular movements intact.     Pupils: Pupils are equal, round, and reactive to light.  Neck:     Vascular: No carotid bruit.   Cardiovascular:     Rate and Rhythm: Regular rhythm. Bradycardia present.     Pulses: Normal pulses.     Heart sounds: No murmur heard.    No friction rub. No gallop.  Pulmonary:     Effort: Pulmonary effort is normal. No respiratory distress.     Breath sounds: Normal breath sounds.  Abdominal:     Palpations: Abdomen is soft.     Tenderness: There is no abdominal tenderness.  Musculoskeletal:        General: Tenderness (reproducible tenderness to the R trap) present. No swelling, deformity or signs of injury. Normal range of motion.     Cervical back: Normal range of motion and neck supple. Tenderness (significant tenderness noted to several painful knots/ trigger points to the R scalenes) present. No rigidity.     Right lower leg: No edema.     Left lower leg: No edema.  Lymphadenopathy:     Cervical: No cervical adenopathy.  Skin:    General: Skin is warm and dry.     Capillary Refill: Capillary refill takes less than 2 seconds.     Coloration: Skin is not jaundiced.     Findings: No bruising, erythema or rash.  Neurological:     General: No focal deficit present.     Mental Status: He is alert and oriented to person, place, and time.  Psychiatric:        Mood and Affect: Mood normal.      UC Treatments / Results  Labs (all labs ordered are listed, but only abnormal results are displayed) Labs Reviewed - No data to display  EKG   Radiology No results found.  Procedures Procedures (including critical care time)  Medications Ordered  in UC Medications  ketorolac (TORADOL) 30 MG/ML injection 30 mg (30 mg Intramuscular Given 03/24/23 1158)    Initial Impression / Assessment and Plan / UC Course  I have reviewed the triage vital signs and the nursing notes.  Pertinent labs & imaging results that were available during my care of the patient were reviewed by me and considered in my medical decision making (see chart for details).  Clinical Course as of 03/24/23 2223  Thu Mar 24, 2023  1151 187/111 recheck [WC]    Clinical Course User Index [WC] Maretta Bees, [place]    Essential HTN - pt is on triple therapy (hydralazine, norvasc, hctz). States he has the medication at home, but has chosen not to take it. We discussed the importance of compliance to prevent complications from uncontrolled HTN. He is denying any red flag s/sx in office. Upon recheck, his systolic did drop . I suspect in part his pain and anxiety sx are also causing acute elevations. Pt understands to take his medication immediately upon return home and to purchase BP cuff to monitor. Pt to establish care with PCP locally for med refills, follow up and monitoring. Trigger point of neck - Pt's sx are reproducible to palpation in the R neck and trap. This is likely stress and tension induced. Pt has no clinical s/sx of dissection. PRN baclofen,  moist heat, massage. Consider trigger point injections if refractory. 30mg  ketorolac given in office to help with his acute pain. Cigarette dependence - pt interested in smoking cessation aid. He is at the action stage to start cutting back with a goal of quitting. Pt understands the repercussions of smoking on HTN. Start Wellbutrin SR 150mg  daily. Adjustment reaction - Buspirone added to help with pt's stress and tension, in addition to aiding with moderate BP regulation and muscle relaxtion. Start 5mg  daily, with taper up to goal of 10mg  BID. F/U with PCP to continue medications.   Total time with patient 40  minutes.   Final Clinical Impressions(s) / UC Diagnoses   Final diagnoses:  Essential hypertension  Trigger point of neck  Cigarette nicotine dependence without complication  Adjustment reaction with mixed emotional features     Discharge Instructions      Start taking buspirone (buspar) as prescribed on your bottle. You will start a slow taper and end on 10mg  twice daily. This will help with stress, blood pressure and muscle tension.  To immediately help with your neck pain, please apply moist heat three times daily, such as that from a microwaveable heat pack. Have someone apply direct, sustained pressure to the affected areas of the muscle. This will release the muscle tension. If you continue to have symptoms, you may benefit from a trigger point injection or dry needling.  One week from now, start the wellbutrin. This will help with you cutting back on your smoking.     ED Prescriptions     Medication Sig Dispense Auth. Provider   busPIRone (BUSPAR) 10 MG tablet Start 1/2 tab once daily x 4 days, take 1/2 tab twice daily x 4 days, then increase to 1 tab in the AM and 1/2 tab in the PM, then 1 tab PO BID (BP, stress, tension) 60 tablet Athene Schuhmacher L, PA   buPROPion (WELLBUTRIN SR) 150 MG 12 hr tablet Take 1 tablet (150 mg total) by mouth daily. (Smoking cessation) 30 tablet Halen Mossbarger L, PA   baclofen (LIORESAL) 10 MG tablet Take 1 tablet (10 mg total) by mouth 3 (three) times daily. As needed for muscle pain/ spasm 30 each Orabelle Rylee L, PA      PDMP not reviewed this encounter.   Maretta Bees, [place] 03/24/23 2205    Maretta Bees, [place] 03/24/23 2223

## 2023-03-24 NOTE — Discharge Instructions (Addendum)
Start taking buspirone (buspar) as prescribed on your bottle. You will start a slow taper and end on 10mg  twice daily. This will help with stress, blood pressure and muscle tension.  To immediately help with your neck pain, please apply moist heat three times daily, such as that from a microwaveable heat pack. Have someone apply direct, sustained pressure to the affected areas of the muscle. This will release the muscle tension. If you continue to have symptoms, you may benefit from a trigger point injection or dry needling.  One week from now, start the wellbutrin. This will help with you cutting back on your smoking.

## 2023-04-08 ENCOUNTER — Ambulatory Visit (HOSPITAL_COMMUNITY)
Admission: EM | Admit: 2023-04-08 | Discharge: 2023-04-08 | Disposition: A | Discharge status: Home / Self Care | Payer: Self-pay | Attending: Emergency Medicine | Admitting: Emergency Medicine

## 2023-04-08 ENCOUNTER — Encounter (HOSPITAL_COMMUNITY): Payer: Self-pay | Admitting: *Deleted

## 2023-04-08 DIAGNOSIS — M25531 Pain in right wrist: Secondary | ICD-10-CM

## 2023-04-08 DIAGNOSIS — M542 Cervicalgia: Secondary | ICD-10-CM

## 2023-04-08 DIAGNOSIS — M79601 Pain in right arm: Secondary | ICD-10-CM

## 2023-04-08 MED ORDER — TRIAMCINOLONE ACETONIDE 40 MG/ML IJ SUSP
60.0000 mg | Freq: Once | INTRAMUSCULAR | Status: AC
  Administered 2023-04-08: 60 mg via INTRAMUSCULAR

## 2023-04-08 MED ORDER — TRIAMCINOLONE ACETONIDE 40 MG/ML IJ SUSP
INTRAMUSCULAR | Status: AC
  Filled 2023-04-08: qty 1

## 2023-04-08 MED ORDER — PREDNISONE 5 MG (21) PO TBPK: ORAL_TABLET | ORAL | 0 refills | Status: DC

## 2023-04-08 NOTE — ED Provider Notes (Signed)
[facility]    CSN: [id number] Arrival date & time: 04/08/23  1439      History   Chief Complaint Chief Complaint  Patient presents with   Arm Pain    HPI Gerald Mills is a 42 y.o. male.   Patient reports ongoing right shoulder and arm pain, now has some numbness and tingling.  Reports this developed as soon as he left from his last visit.  Had been given a Toradol injection in clinic, reports this made his pain worse.  He did not pick up the muscle relaxer.  Reports his pain is worse when he tries to sleep, is unable to get comfortable.  Reports he has been trying to quit smoking, has not taken the smoking cessation medication.   The history is provided by the patient and medical records.  Arm Pain    Past Medical History:  Diagnosis Date   Hypertension     Patient Active Problem List   Diagnosis Date Noted   Rhabdomyolysis 03/01/2023   Polysubstance abuse (HCC) 03/01/2023   Involuntary commitment 03/01/2023   Hypocalcemia 03/01/2023   Acute renal failure (ARF) (HCC) 03/01/2023   Opioid use disorder, moderate, dependence (HCC) 02/27/2023   Adjustment disorder with depressed mood 02/27/2023   Nausea and vomiting 02/26/2023   Acute renal failure (HCC) 02/26/2023   Hypokalemia 02/26/2023   Opioid use disorder 02/25/2023   Opiate abuse, continuous (HCC) 09/08/2021   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   Mood disorder (HCC) 03/16/2021   Pain of left calf 10/08/2020   History of snoring 05/28/2020   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 No pertinent surgical history.     Home Medications    Prior to Admission medications   Medication Sig Start Date End  Date Taking? Authorizing Provider  amLODipine (NORVASC) 10 MG tablet Take 1 tablet (10 mg total) by mouth every evening. 03/05/23 04/08/23 Yes Meredeth Ide, MD  hydrochlorothiazide (HYDRODIURIL) 25 MG tablet Take 1 tablet (25 mg total) by mouth every morning. 03/05/23  Yes Lama, Sarina Ill, MD  predniSONE (STERAPRED UNI-PAK 21 TAB) 5 MG (21) TBPK tablet Take as prescribed. 04/08/23  Yes Rinaldo Ratel, [place] N, FNP  baclofen (LIORESAL) 10 MG tablet Take 1 tablet (10 mg total) by mouth 3 (three) times daily. As needed for muscle pain/ spasm 03/24/23   Crain, Whitney L, PA  buPROPion (WELLBUTRIN SR) 150 MG 12 hr tablet Take 1 tablet (150 mg total) by mouth daily. (Smoking cessation) 03/24/23   Crain, Whitney L, PA  busPIRone (BUSPAR) 10 MG tablet Start 1/2 tab once daily x 4 days, take 1/2 tab twice daily x 4 days, then increase to 1 tab in the AM and 1/2 tab in the PM, then 1 tab PO BID (BP, stress, tension) 03/24/23   Crain, Whitney L, PA  hydrALAZINE (APRESOLINE) 10 MG tablet Take 1 tablet (10 mg total) by mouth 2 (two) times daily. 03/05/23   Meredeth Ide, MD  sildenafil (VIAGRA) 50 MG tablet Take 1 tablet (50 mg total) by mouth daily as needed for erectile dysfunction (Do not use more than one dose per day). 03/05/23   Meredeth Ide, MD  furosemide (LASIX) 20 MG tablet Take 1  tablet (20 mg total) by mouth daily for 7 days. 09/25/20 03/03/21  Bing Neighbors, NP  lisinopril-hydrochlorothiazide (ZESTORETIC) 10-12.5 MG tablet Take 1 tablet by mouth daily. 05/21/19 08/25/19  Mickie Bail, NP    Family History Family History  Problem Relation Age of Onset   Diabetes Mother    Hypertension Mother    Healthy Father    Hypertension Father     Social History Social History   Tobacco Use   Smoking status: Every Day    Packs/day: 0.50    Years: 24.00    Additional pack years: 0.00    Total pack years: 12.00    Types: Cigarettes   Smokeless tobacco: Never  Vaping Use   Vaping Use: Never used  Substance  Use Topics   Alcohol use: Yes    Comment: occ   Drug use: Never     Allergies   Patient has no known allergies.   Review of Systems Review of Systems  Musculoskeletal:  Positive for myalgias and neck stiffness.     Physical Exam Triage Vital Signs ED Triage Vitals  Enc Vitals Group     BP 04/08/23 1508 (!) 160/99     Pulse Rate 04/08/23 1508 (!) 55     Resp 04/08/23 1508 18     Temp 04/08/23 1508 98.2 F (36.8 C)     Temp Source 04/08/23 1508 Oral     SpO2 04/08/23 1508 98 %     Weight --      Height --      Head Circumference --      Peak Flow --      Pain Score 04/08/23 1505 10     Pain Loc --      Pain Edu? --      Excl. in GC? --    No data found.  Updated Vital Signs BP (!) 160/99 (BP Location: Left Arm)   Pulse (!) 55   Temp 98.2 F (36.8 C) (Oral)   Resp 18   SpO2 98%   Visual Acuity Right Eye Distance:   Left Eye Distance:   Bilateral Distance:    Right Eye Near:   Left Eye Near:    Bilateral Near:     Physical Exam Vitals and nursing note reviewed.  Constitutional:      Appearance: Normal appearance.  HENT:     Head: Normocephalic and atraumatic.     Right Ear: External ear normal.     Left Ear: External ear normal.     Nose: Nose normal.     Mouth/Throat:     Mouth: Mucous membranes are moist.  Eyes:     Conjunctiva/sclera: Conjunctivae normal.  Cardiovascular:     Rate and Rhythm: Normal rate.  Pulmonary:     Effort: Pulmonary effort is normal. No respiratory distress.  Musculoskeletal:        General: Tenderness present. No swelling, deformity or signs of injury. Normal range of motion.       Arms:     Cervical back: Normal.       Back:     Comments: Right shoulder and neck pain, TTP.  Cervical spine without step-off or deformity.  No recent trauma or changes.  Skin:    General: Skin is warm and dry.     Findings: No rash.  Neurological:     General: No focal deficit present.     Mental Status: He is alert and oriented  to person, place, and  time.  Psychiatric:        Mood and Affect: Mood normal.        Behavior: Behavior normal. Behavior is cooperative.      UC Treatments / Results  Labs (all labs ordered are listed, but only abnormal results are displayed) Labs Reviewed - No data to display  EKG   Radiology No results found.  Procedures Procedures (including critical care time)  Medications Ordered in UC Medications  triamcinolone acetonide (KENALOG-40) injection 60 mg (has no administration in time range)    Initial Impression / Assessment and Plan / UC Course  I have reviewed the triage vital signs and the nursing notes.  Pertinent labs & imaging results that were available during my care of the patient were reviewed by me and considered in my medical decision making (see chart for details).  Vitals and triage reviewed, patient is hemodynamically stable.  Right shoulder and neck TTP, MS in nature.  Pain elicited with bending and twisting.  Denies chest pain or shortness of breath.  Low concern for cardiac etiology at this time.  Limitations to range of motion to right shoulder d/t pain.  Reports diminished grip and right hand, strength 5 out of 5 bilaterally.  Neurovascularly intact.  Provided with a wrist brace for comfort.  IM steroids given in clinic, steroid burst scented, encouraged to fill muscle relaxers and follow-up with orthopedics.  Patient is anxious, reports he recently had to start over, would not go into detail.  Given behavioral health urgent care resources.  Plan of care, follow-up care and return precautions given, no questions at this time.    Final Clinical Impressions(s) / UC Diagnoses   Final diagnoses:  Right arm pain  Right wrist pain  Trigger point of neck     Discharge Instructions      We have given you a IM steroid injection in clinic.  Please start your oral steroids tomorrow.  You can take them when you wake up with breakfast.  I also suggest wearing  the brace at night.  If your symptoms persist, please follow-up with the orthopedic provided.  There is also a 24/7 behavioral health urgent care, I have attached information for this if ever needed.      ED Prescriptions     Medication Sig Dispense Auth. Provider   predniSONE (STERAPRED UNI-PAK 21 TAB) 5 MG (21) TBPK tablet Take as prescribed. 21 tablet Cailen Mihalik, [place] N, [place]      PDMP not reviewed this encounter.   Hessie Varone, [place] N, [place] 04/08/23 1536

## 2023-04-08 NOTE — Discharge Instructions (Signed)
We have given you a IM steroid injection in clinic.  Please start your oral steroids tomorrow.  You can take them when you wake up with breakfast.  I also suggest wearing the brace at night.  If your symptoms persist, please follow-up with the orthopedic provided.  There is also a 24/7 behavioral health urgent care, I have attached information for this if ever needed.

## 2023-04-08 NOTE — ED Triage Notes (Signed)
Pt states his pain from last ov in 03/24/2023 is no better. He states he is having some numbness and discomfort in his right shoulder and down his arm. He has been taking IBU 800mg  around the clock and its not working at all. The pain is worse when he lays down.   He states he didn't pick up the muscle relaxer. He states he doesn't have the funds to get the meds and if we could give him something in office for the pain vs a rx.

## 2023-05-12 ENCOUNTER — Emergency Department (HOSPITAL_COMMUNITY)
Admission: EM | Admit: 2023-05-12 | Discharge: 2023-05-12 | Discharge status: Against Medical Advice | Payer: Self-pay | Attending: Emergency Medicine | Admitting: Emergency Medicine

## 2023-05-12 ENCOUNTER — Other Ambulatory Visit: Payer: Self-pay

## 2023-05-12 DIAGNOSIS — X58XXXA Exposure to other specified factors, initial encounter: Secondary | ICD-10-CM | POA: Insufficient documentation

## 2023-05-12 DIAGNOSIS — R451 Restlessness and agitation: Secondary | ICD-10-CM | POA: Insufficient documentation

## 2023-05-12 DIAGNOSIS — Z79899 Other long term (current) drug therapy: Secondary | ICD-10-CM | POA: Insufficient documentation

## 2023-05-12 DIAGNOSIS — Z5329 Procedure and treatment not carried out because of patient's decision for other reasons: Secondary | ICD-10-CM | POA: Insufficient documentation

## 2023-05-12 DIAGNOSIS — I10 Essential (primary) hypertension: Secondary | ICD-10-CM | POA: Insufficient documentation

## 2023-05-12 DIAGNOSIS — S0081XA Abrasion of other part of head, initial encounter: Secondary | ICD-10-CM | POA: Insufficient documentation

## 2023-05-12 DIAGNOSIS — F19920 Other psychoactive substance use, unspecified with intoxication, uncomplicated: Secondary | ICD-10-CM | POA: Insufficient documentation

## 2023-05-12 MED ORDER — SODIUM CHLORIDE 0.9 % IV SOLN: INTRAVENOUS | Status: DC

## 2023-05-12 MED ORDER — SODIUM CHLORIDE 0.9 % IV BOLUS: 1000.0000 mL | Freq: Once | INTRAVENOUS | Status: DC

## 2023-05-12 NOTE — ED Provider Notes (Signed)
[place] EMERGENCY DEPARTMENT AT [facility] Provider Note   CSN: [id number] Arrival date & time: 05/12/23  6962     History  No chief complaint on file.   Gerald Mills is a 42 y.o. male.  Pt is a 42 yo male with pmhx significant for HTN and hx polysubstance abuse.  EMS was called to pt's house because his family said he's been using Flakka and has not been sleeping.  He was yelling in the middle of the street and acting strangely.  Pt would not let EMS start an IV.  He tells me that he works 3rd shift and has not slept because he worked last night.  He denies drugs.  He is still refusing everything.  He keeps yelling "fuck you."       Home Medications Prior to Admission medications   Medication Sig Start Date End Date Taking? Authorizing Provider  amLODipine (NORVASC) 10 MG tablet Take 1 tablet (10 mg total) by mouth every evening. 03/05/23 04/08/23  Meredeth Ide, MD  baclofen (LIORESAL) 10 MG tablet Take 1 tablet (10 mg total) by mouth 3 (three) times daily. As needed for muscle pain/ spasm 03/24/23   Crain, Whitney L, PA  buPROPion (WELLBUTRIN SR) 150 MG 12 hr tablet Take 1 tablet (150 mg total) by mouth daily. (Smoking cessation) 03/24/23   Crain, Whitney L, PA  busPIRone (BUSPAR) 10 MG tablet Start 1/2 tab once daily x 4 days, take 1/2 tab twice daily x 4 days, then increase to 1 tab in the AM and 1/2 tab in the PM, then 1 tab PO BID (BP, stress, tension) 03/24/23   Crain, Whitney L, PA  hydrALAZINE (APRESOLINE) 10 MG tablet Take 1 tablet (10 mg total) by mouth 2 (two) times daily. 03/05/23   Meredeth Ide, MD  hydrochlorothiazide (HYDRODIURIL) 25 MG tablet Take 1 tablet (25 mg total) by mouth every morning. 03/05/23   Meredeth Ide, MD  predniSONE (STERAPRED UNI-PAK 21 TAB) 5 MG (21) TBPK tablet Take as prescribed. 04/08/23   Garrison, [place] N, FNP  sildenafil (VIAGRA) 50 MG tablet Take 1 tablet (50 mg total) by mouth daily as needed for erectile dysfunction (Do not use  more than one dose per day). 03/05/23   Meredeth Ide, MD  furosemide (LASIX) 20 MG tablet Take 1 tablet (20 mg total) by mouth daily for 7 days. 09/25/20 03/03/21  Bing Neighbors, NP  lisinopril-hydrochlorothiazide (ZESTORETIC) 10-12.5 MG tablet Take 1 tablet by mouth daily. 05/21/19 08/25/19  Mickie Bail, NP      Allergies    Patient has no known allergies.    Review of Systems   Review of Systems  All other systems reviewed and are negative.   Physical Exam Updated Vital Signs BP (!) 204/183   Pulse 77   Temp 98.2 F (36.8 C) (Oral)   Resp (!) 23   Ht 6\' 4"  (1.93 m)   SpO2 97%   BMI 30.91 kg/m  Physical Exam Vitals and nursing note reviewed.  Constitutional:      Appearance: He is obese.  HENT:     Head: Normocephalic.     Comments: Several abrasions to face    Right Ear: External ear normal.     Left Ear: External ear normal.     Nose: Nose normal.     Mouth/Throat:     Mouth: Mucous membranes are dry.  Eyes:     Extraocular Movements: Extraocular movements intact.  Conjunctiva/sclera: Conjunctivae normal.     Pupils: Pupils are equal, round, and reactive to light.  Cardiovascular:     Rate and Rhythm: Normal rate and regular rhythm.     Pulses: Normal pulses.     Heart sounds: Normal heart sounds.  Pulmonary:     Effort: Pulmonary effort is normal.     Breath sounds: Normal breath sounds.  Abdominal:     General: Abdomen is flat. Bowel sounds are normal.     Palpations: Abdomen is soft.  Musculoskeletal:        General: Normal range of motion.     Cervical back: Normal range of motion and neck supple.  Skin:    General: Skin is warm.     Capillary Refill: Capillary refill takes less than 2 seconds.     Comments: Abrasions to skin  Neurological:     Mental Status: He is alert and oriented to person, place, and time.     Comments: Bizarre behavior.  He is talking to himself and rocking back and forth in room.  Psychiatric:        Mood and Affect:  Affect is labile.        Speech: Speech is tangential.        Behavior: Behavior is agitated and hyperactive.     ED Results / Procedures / Treatments   Labs (all labs ordered are listed, but only abnormal results are displayed) Labs Reviewed - No data to display   EKG None  Radiology No results found.  Procedures Procedures    Medications Ordered in ED Medications - No data to display   ED Course/ Medical Decision Making/ A&P                             Medical Decision Making Amount and/or Complexity of Data Reviewed Labs: ordered.  Risk Prescription drug management.   This patient presents to the ED for concern of ams, this involves an extensive number of treatment options, and is a complaint that carries with it a high risk of complications and morbidity.  The differential diagnosis includes drug intoxication, electrolyte abn   Co morbidities that complicate the patient evaluation  htn   Additional history obtained:  Additional history obtained from epic chart review External records from outside source obtained and reviewed including EMS report   Problem List / ED Course:  Suspected drug use:  pt does seem under the influence of drugs, but remains alert and oriented.  He refused any monitoring or labs or fluids or meds.  He was watched for about an hour and decided he wanted to go.  I asked him to stay so we could watch him.  However, he got up and started walking out the door.  Pt is not suicidal/homicidal and is able to make his own decisions.  He was told to return if worse.  He would not let the nurse check his bp again.  Pt was very agitated during the first one.     Reevaluation:  After the interventions noted above, I reevaluated the patient and found that they have :stayed the same   Social Determinants of Health:  Lives at home   Dispostion:  Pt would benefit from some fluids, labs, and bzd.  However, he refuses.  Pt knows to return  if worse.         Final Clinical Impression(s) / ED Diagnoses Final diagnoses:  Drug intoxication  without complication (HCC)  Hypertension, unspecified type    Rx / DC Orders ED Discharge Orders     None         Jacalyn Lefevre, MD 05/12/23 1630

## 2023-05-12 NOTE — ED Notes (Addendum)
MD educated patient on risks of leaving. Patient still unwilling to stay. He ambulated out of ED without assistance.

## 2023-05-12 NOTE — ED Notes (Signed)
This NT and another NT attempted to get CBG. Pt refused. RN notified.

## 2023-05-12 NOTE — ED Triage Notes (Signed)
Pt bib ems  Pt is u/t verbalize why he is at the emergency room. Pt keeps saying, "this is crazy and he wants to clean his hands." Pt has a laceration on the top of his forehead and on the left cheek.

## 2023-05-12 NOTE — ED Triage Notes (Signed)
Patient from home, fiance says he's been in a hyperactive state without sleep x 24 hours. Patient denies drug use but fiance says he used flakka/gravel. Patient has been yelling in the street, banging on a barrel in the middle of the road, and other abnormal behaviors.

## 2023-05-12 NOTE — ED Notes (Signed)
Pt declined to do urine sample at this time.

## 2023-05-12 NOTE — ED Notes (Signed)
Pt states he doesn't want anyone to stick him. Pt states to not belittle him and speak to him with respect. RN has reassured the staff are just trying to make sure he is alright.

## 2023-05-12 NOTE — ED Notes (Signed)
Pt is room rocking back and forth talking to himself.

## 2023-06-21 ENCOUNTER — Encounter (HOSPITAL_COMMUNITY): Payer: Self-pay

## 2023-06-21 ENCOUNTER — Ambulatory Visit (HOSPITAL_COMMUNITY)
Admission: EM | Admit: 2023-06-21 | Discharge: 2023-06-21 | Disposition: A | Discharge status: Home / Self Care | Payer: Self-pay | Attending: Internal Medicine | Admitting: Internal Medicine

## 2023-06-21 DIAGNOSIS — J069 Acute upper respiratory infection, unspecified: Secondary | ICD-10-CM | POA: Insufficient documentation

## 2023-06-21 DIAGNOSIS — R030 Elevated blood-pressure reading, without diagnosis of hypertension: Secondary | ICD-10-CM | POA: Insufficient documentation

## 2023-06-21 DIAGNOSIS — Z20822 Contact with and (suspected) exposure to covid-19: Secondary | ICD-10-CM | POA: Insufficient documentation

## 2023-06-21 MED ORDER — POLYMYXIN B-TRIMETHOPRIM 10000-0.1 UNIT/ML-% OP SOLN: 1.0000 [drp] | Freq: Three times a day (TID) | OPHTHALMIC | 0 refills | Status: DC

## 2023-06-21 MED ORDER — BENZONATATE 200 MG PO CAPS: 200.0000 mg | ORAL_CAPSULE | Freq: Three times a day (TID) | ORAL | 0 refills | Status: DC | PRN

## 2023-06-21 NOTE — Discharge Instructions (Addendum)
We will call you if your Covid test is positive. In the mean time stay quarantined until the results are back. If positive, you need to quarantine for 5 days, then wear a mask for 5 days when out in public.   You blood pressure has been elevated in the past 2 months, please check it when you are not at the doctors, either at [facility] or [facility] and if your number needs to be less than 140/90. Please follow up with your family doctor.

## 2023-06-21 NOTE — ED Provider Notes (Signed)
[facility]    CSN: [id number] Arrival date & time: 06/21/23  1234      History   Chief Complaint Chief Complaint  Patient presents with   COVID TEST    HPI Gerald Mills is a 42 y.o. male who presents due to being exposed to Covid at work and would like to be tested. He has lost his sense of taste and has nose congestion and his eyes have been draining yellow matter x 2 days.     Past Medical History:  Diagnosis Date   Hypertension     Patient Active Problem List   Diagnosis Date Noted   Rhabdomyolysis 03/01/2023   Polysubstance abuse (HCC) 03/01/2023   Involuntary commitment 03/01/2023   Hypocalcemia 03/01/2023   Acute renal failure (ARF) (HCC) 03/01/2023   Opioid use disorder, moderate, dependence (HCC) 02/27/2023   Adjustment disorder with depressed mood 02/27/2023   Nausea and vomiting 02/26/2023   Acute renal failure (HCC) 02/26/2023   Hypokalemia 02/26/2023   Opioid use disorder 02/25/2023   Opiate abuse, continuous (HCC) 09/08/2021   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   Mood disorder (HCC) 03/16/2021   Pain of left calf 10/08/2020   History of snoring 05/28/2020   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 No pertinent surgical history.     Home Medications    Prior to Admission medications   Medication Sig Start Date End Date Taking? Authorizing Provider  benzonatate (TESSALON) 200 MG capsule Take 1 capsule (200 mg total) by mouth 3 (three) times daily as needed for cough. 06/21/23  Yes Rodriguez-Southworth, Nettie Elm, PA-C  trimethoprim-polymyxin b (POLYTRIM) ophthalmic solution Place 1 drop into both eyes in the morning, at noon, and at bedtime. 06/21/23   Yes Rodriguez-Southworth, Nettie Elm, PA-C  amLODipine (NORVASC) 10 MG tablet Take 1 tablet (10 mg total) by mouth every evening. 03/05/23 04/08/23  Meredeth Ide, MD  buPROPion (WELLBUTRIN SR) 150 MG 12 hr tablet Take 1 tablet (150 mg total) by mouth daily. (Smoking cessation) 03/24/23   Crain, Whitney L, PA  busPIRone (BUSPAR) 10 MG tablet Start 1/2 tab once daily x 4 days, take 1/2 tab twice daily x 4 days, then increase to 1 tab in the AM and 1/2 tab in the PM, then 1 tab PO BID (BP, stress, tension) 03/24/23   Crain, Whitney L, PA  hydrALAZINE (APRESOLINE) 10 MG tablet Take 1 tablet (10 mg total) by mouth 2 (two) times daily. 03/05/23   Meredeth Ide, MD  hydrochlorothiazide (HYDRODIURIL) 25 MG tablet Take 1 tablet (25 mg total) by mouth every morning. 03/05/23   Meredeth Ide, MD  furosemide (LASIX) 20 MG tablet Take 1 tablet (20 mg total) by mouth daily for 7 days. 09/25/20 03/03/21  Bing Neighbors, NP  lisinopril-hydrochlorothiazide (ZESTORETIC) 10-12.5 MG tablet Take 1 tablet by mouth daily. 05/21/19 08/25/19  Mickie Bail, NP    Family History Family History  Problem Relation Age of Onset   Diabetes Mother    Hypertension Mother    Healthy Father    Hypertension Father     Social History Social History   Tobacco Use   Smoking status: Every Day    Current packs/day: 0.50  Average packs/day: 0.5 packs/day for 24.0 years (12.0 ttl pk-yrs)    Types: Cigarettes   Smokeless tobacco: Never  Vaping Use   Vaping status: Never Used  Substance Use Topics   Alcohol use: Yes    Comment: occ   Drug use: Never     Allergies   Patient has no known allergies.   Review of Systems Review of Systems  Constitutional:  Positive for fatigue. Negative for activity change, appetite change and fever.  HENT:  Positive for congestion and postnasal drip. Negative for ear discharge, ear pain, rhinorrhea and sore throat.   Eyes:  Negative for discharge.  Respiratory:  Positive for cough. Negative  for shortness of breath.   Cardiovascular:  Negative for chest pain and leg swelling.       Has hx of HTN and has been out of his BP meds x 2 months. Just picked them up today. Has not taken it yet     Physical Exam Triage Vital Signs ED Triage Vitals [06/21/23 1335]  Encounter Vitals Group     BP (!) 214/105     Systolic BP Percentile      Diastolic BP Percentile      Pulse Rate (!) 47     Resp 17     Temp 97.8 F (36.6 C)     Temp Source Oral     SpO2 96 %     Weight      Height      Head Circumference      Peak Flow      Pain Score 6     Pain Loc      Pain Education      Exclude from Growth Chart    No data found.  Updated Vital Signs BP (!) 214/105 (BP Location: Right Arm) Comment: reports he just took his BP medicine,  Pulse (!) 47   Temp 97.8 F (36.6 C) (Oral)   Resp 17   SpO2 96%   Visual Acuity Right Eye Distance:   Left Eye Distance:   Bilateral Distance:    Right Eye Near:   Left Eye Near:    Bilateral Near:     Physical Exam Vitals and nursing note reviewed.  Constitutional:      General: He is not in acute distress.    Appearance: He is not toxic-appearing.  HENT:     Right Ear: Tympanic membrane, ear canal and external ear normal.     Left Ear: Tympanic membrane, ear canal and external ear normal.     Nose: Congestion present.     Mouth/Throat:     Mouth: Mucous membranes are moist.     Pharynx: Oropharynx is clear.  Eyes:     General: No scleral icterus.    Conjunctiva/sclera: Conjunctivae normal.  Cardiovascular:     Rate and Rhythm: Normal rate and regular rhythm.  Pulmonary:     Effort: Pulmonary effort is normal.     Breath sounds: Normal breath sounds.  Musculoskeletal:        General: Normal range of motion.     Cervical back: Neck supple.  Lymphadenopathy:     Cervical: No cervical adenopathy.  Skin:    General: Skin is warm and dry.  Neurological:     Mental Status: He is alert and oriented to person, place, and time.      Gait: Gait normal.  Psychiatric:        Mood and Affect: Mood normal.  Behavior: Behavior normal.        Thought Content: Thought content normal.        Judgment: Judgment normal.    Physical Exam Vitals signs and nursing note reviewed.  Constitutional:      General: he is not in acute distress.    Appearance: Normal appearance. He is not ill-appearing, toxic-appearing or diaphoretic.  HENT:     Head: Normocephalic.     Right Ear: Tympanic membrane, ear canal and external ear normal.     Left Ear: Tympanic membrane, ear canal and external ear normal.     Nose: Nose normal.     Mouth/Throat:     Mouth: Mucous membranes are moist.  Eyes:     General: No scleral icterus.       Right eye: No discharge.        Left eye: No discharge.     Conjunctiva/sclera: Conjunctivae normal.  Neck:     Musculoskeletal: Neck supple. No neck rigidity.  Cardiovascular:     Rate and Rhythm: Normal rate and regular rhythm.     Heart sounds: No murmur.  Pulmonary:     Effort: Pulmonary effort is normal.     Breath sounds: Normal breath sounds.  Abdominal:     General: Bowel sounds are normal. There is no distension.     Palpations: Abdomen is soft. There is no mass.     Tenderness: There is no abdominal tenderness. There is no guarding or rebound.     Hernia: No hernia is present.  Musculoskeletal: Normal range of motion.  Lymphadenopathy:     Cervical: No cervical adenopathy.  Skin:    General: Skin is warm and dry.     Coloration: Skin is not jaundiced.     Findings: No rash.  Neurological:     Mental Status: he is alert and oriented to person, place, and time.     Gait: Gait normal.  Psychiatric:        Mood and Affect: Mood normal.        Behavior: Behavior normal.        Thought Content: Thought content normal.        Judgment: Judgment normal.    UC Treatments / Results  Labs (all labs ordered are listed, but only abnormal results are displayed) Labs Reviewed  SARS  CORONAVIRUS 2 (TAT 6-24 HRS)    EKG   Radiology No results found.  Procedures Procedures (including critical care time)  Medications Ordered in UC Medications - No data to display  Initial Impression / Assessment and Plan / UC Course  I have reviewed the triage vital signs and the nursing notes.  Covid exposure Elevated BP Bilateral bacterial conjunctivitis  He was placed on Tessalon and polytrim eye gtts as noted. We will inform him if the Covid test is positive. In the mean time needs to stay quarantined.  Final Clinical Impressions(s) / UC Diagnoses   Final diagnoses:  Close exposure to COVID-19 virus  Elevated blood pressure reading  Acute upper respiratory infection     Discharge Instructions      We will call you if your Covid test is positive. In the mean time stay quarantined until the results are back. If positive, you need to quarantine for 5 days, then wear a mask for 5 days when out in public.   You blood pressure has been elevated in the past 2 months, please check it when you are not at the doctors, either at  [facility] or [facility] and if your number needs to be less than 140/90. Please follow up with your family doctor.      ED Prescriptions     Medication Sig Dispense Auth. Provider   trimethoprim-polymyxin b (POLYTRIM) ophthalmic solution Place 1 drop into both eyes in the morning, at noon, and at bedtime. 10 mL Rodriguez-Southworth, Nettie Elm, PA-C   benzonatate (TESSALON) 200 MG capsule Take 1 capsule (200 mg total) by mouth 3 (three) times daily as needed for cough. 30 capsule Rodriguez-Southworth, Nettie Elm, PA-C      PDMP not reviewed this encounter.   Garey Ham, PA-C 06/21/23 1520

## 2023-06-21 NOTE — ED Triage Notes (Signed)
Pt presents with exposure to COVID at work. Request a test.   Pt states he lost his sense of taste, nasal congestion and chest pressure.   States he has not taken anything at home for relief.

## 2023-12-20 ENCOUNTER — Inpatient Hospital Stay (HOSPITAL_COMMUNITY)
Admission: EM | Admit: 2023-12-20 | Discharge: 2023-12-20 | DRG: 917 | Disposition: A | Discharge status: Home / Self Care | Payer: MEDICAID | Attending: Pulmonary Disease | Admitting: Pulmonary Disease

## 2023-12-20 ENCOUNTER — Inpatient Hospital Stay (HOSPITAL_COMMUNITY): Payer: MEDICAID

## 2023-12-20 ENCOUNTER — Other Ambulatory Visit: Payer: Self-pay

## 2023-12-20 ENCOUNTER — Encounter (HOSPITAL_COMMUNITY): Payer: Self-pay | Admitting: Emergency Medicine

## 2023-12-20 DIAGNOSIS — T40601A Poisoning by unspecified narcotics, accidental (unintentional), initial encounter: Principal | ICD-10-CM | POA: Diagnosis present

## 2023-12-20 DIAGNOSIS — I129 Hypertensive chronic kidney disease with stage 1 through stage 4 chronic kidney disease, or unspecified chronic kidney disease: Secondary | ICD-10-CM | POA: Diagnosis present

## 2023-12-20 DIAGNOSIS — N179 Acute kidney failure, unspecified: Secondary | ICD-10-CM | POA: Diagnosis present

## 2023-12-20 DIAGNOSIS — Z8249 Family history of ischemic heart disease and other diseases of the circulatory system: Secondary | ICD-10-CM

## 2023-12-20 DIAGNOSIS — N1832 Chronic kidney disease, stage 3b: Secondary | ICD-10-CM | POA: Diagnosis present

## 2023-12-20 DIAGNOSIS — F4320 Adjustment disorder, unspecified: Secondary | ICD-10-CM | POA: Diagnosis present

## 2023-12-20 DIAGNOSIS — F39 Unspecified mood [affective] disorder: Secondary | ICD-10-CM | POA: Diagnosis present

## 2023-12-20 DIAGNOSIS — T50904A Poisoning by unspecified drugs, medicaments and biological substances, undetermined, initial encounter: Secondary | ICD-10-CM

## 2023-12-20 DIAGNOSIS — F1020 Alcohol dependence, uncomplicated: Secondary | ICD-10-CM | POA: Diagnosis present

## 2023-12-20 DIAGNOSIS — F1110 Opioid abuse, uncomplicated: Secondary | ICD-10-CM | POA: Diagnosis present

## 2023-12-20 DIAGNOSIS — R400 Somnolence: Secondary | ICD-10-CM | POA: Diagnosis present

## 2023-12-20 DIAGNOSIS — Z91148 Patient's other noncompliance with medication regimen for other reason: Secondary | ICD-10-CM

## 2023-12-20 DIAGNOSIS — G928 Other toxic encephalopathy: Secondary | ICD-10-CM | POA: Diagnosis present

## 2023-12-20 DIAGNOSIS — Z833 Family history of diabetes mellitus: Secondary | ICD-10-CM

## 2023-12-20 DIAGNOSIS — T40604A Poisoning by unspecified narcotics, undetermined, initial encounter: Principal | ICD-10-CM

## 2023-12-20 DIAGNOSIS — F1910 Other psychoactive substance abuse, uncomplicated: Secondary | ICD-10-CM | POA: Diagnosis present

## 2023-12-20 DIAGNOSIS — T50901A Poisoning by unspecified drugs, medicaments and biological substances, accidental (unintentional), initial encounter: Secondary | ICD-10-CM | POA: Diagnosis present

## 2023-12-20 DIAGNOSIS — Z79899 Other long term (current) drug therapy: Secondary | ICD-10-CM

## 2023-12-20 DIAGNOSIS — F17210 Nicotine dependence, cigarettes, uncomplicated: Secondary | ICD-10-CM | POA: Diagnosis present

## 2023-12-20 LAB — CBC WITH DIFFERENTIAL/PLATELET
Abs Immature Granulocytes: 0.04 10*3/uL (ref 0.00–0.07)
Basophils Absolute: 0 10*3/uL (ref 0.0–0.1)
Basophils Relative: 0 %
Eosinophils Absolute: 0 10*3/uL (ref 0.0–0.5)
Eosinophils Relative: 0 %
HCT: 41.1 % (ref 39.0–52.0)
Hemoglobin: 13.3 g/dL (ref 13.0–17.0)
Immature Granulocytes: 0 %
Lymphocytes Relative: 10 %
Lymphs Abs: 1.4 10*3/uL (ref 0.7–4.0)
MCH: 30.2 pg (ref 26.0–34.0)
MCHC: 32.4 g/dL (ref 30.0–36.0)
MCV: 93.4 fL (ref 80.0–100.0)
Monocytes Absolute: 1 10*3/uL (ref 0.1–1.0)
Monocytes Relative: 7 %
Neutro Abs: 11.5 10*3/uL — ABNORMAL HIGH (ref 1.7–7.7)
Neutrophils Relative %: 83 %
Platelets: 287 10*3/uL (ref 150–400)
RBC: 4.4 MIL/uL (ref 4.22–5.81)
RDW: 14.2 % (ref 11.5–15.5)
WBC: 13.9 10*3/uL — ABNORMAL HIGH (ref 4.0–10.5)
nRBC: 0 % (ref 0.0–0.2)

## 2023-12-20 LAB — COMPREHENSIVE METABOLIC PANEL
ALT: 25 U/L (ref 0–44)
AST: 43 U/L — ABNORMAL HIGH (ref 15–41)
Albumin: 4.9 g/dL (ref 3.5–5.0)
Alkaline Phosphatase: 66 U/L (ref 38–126)
Anion gap: 17 — ABNORMAL HIGH (ref 5–15)
BUN: 25 mg/dL — ABNORMAL HIGH (ref 6–20)
CO2: 23 mmol/L (ref 22–32)
Calcium: 9.8 mg/dL (ref 8.9–10.3)
Chloride: 104 mmol/L (ref 98–111)
Creatinine, Ser: 2.06 mg/dL — ABNORMAL HIGH (ref 0.61–1.24)
GFR, Estimated: 40 mL/min — ABNORMAL LOW (ref >60)
Glucose, Bld: 129 mg/dL — ABNORMAL HIGH (ref 70–99)
Potassium: 3.6 mmol/L (ref 3.5–5.1)
Sodium: 144 mmol/L (ref 135–145)
Total Bilirubin: 1.5 mg/dL — ABNORMAL HIGH (ref 0.0–1.2)
Total Protein: 8.1 g/dL (ref 6.5–8.1)

## 2023-12-20 LAB — ETHANOL: Alcohol, Ethyl (B): <10 mg/dL (ref <10)

## 2023-12-20 MED ORDER — NALOXONE HCL 4 MG/10ML IJ SOLN
1.0000 mg/h | INTRAVENOUS | Status: DC
  Administered 2023-12-20: 1 mg/h via INTRAVENOUS
  Filled 2023-12-20 (×2): qty 10

## 2023-12-20 MED ORDER — DOCUSATE SODIUM 100 MG PO CAPS: 100.0000 mg | ORAL_CAPSULE | Freq: Two times a day (BID) | ORAL | Status: DC | PRN

## 2023-12-20 MED ORDER — FOLIC ACID 1 MG PO TABS
1.0000 mg | ORAL_TABLET | Freq: Every day | ORAL | Status: DC
  Administered 2023-12-20: 1 mg via ORAL
  Filled 2023-12-20: qty 1

## 2023-12-20 MED ORDER — HYDRALAZINE HCL 10 MG PO TABS
10.0000 mg | ORAL_TABLET | Freq: Two times a day (BID) | ORAL | Status: DC
  Administered 2023-12-20: 10 mg via ORAL
  Filled 2023-12-20: qty 1

## 2023-12-20 MED ORDER — THIAMINE MONONITRATE 100 MG PO TABS
100.0000 mg | ORAL_TABLET | Freq: Every day | ORAL | Status: DC
  Administered 2023-12-20: 100 mg via ORAL
  Filled 2023-12-20: qty 1

## 2023-12-20 MED ORDER — AMMONIA AROMATIC IN INHA
RESPIRATORY_TRACT | Status: AC
  Filled 2023-12-20: qty 10

## 2023-12-20 MED ORDER — AMLODIPINE BESYLATE 5 MG PO TABS: 10.0000 mg | ORAL_TABLET | Freq: Every evening | ORAL | Status: DC

## 2023-12-20 MED ORDER — POLYETHYLENE GLYCOL 3350 17 G PO PACK: 17.0000 g | PACK | Freq: Every day | ORAL | Status: DC | PRN

## 2023-12-20 MED ORDER — LORAZEPAM 1 MG PO TABS: 1.0000 mg | ORAL_TABLET | ORAL | Status: DC | PRN

## 2023-12-20 MED ORDER — BUPROPION HCL ER (SR) 150 MG PO TB12
150.0000 mg | ORAL_TABLET | Freq: Every day | ORAL | Status: DC
  Administered 2023-12-20: 150 mg via ORAL
  Filled 2023-12-20: qty 1

## 2023-12-20 MED ORDER — LORAZEPAM 2 MG/ML IJ SOLN: 1.0000 mg | INTRAMUSCULAR | Status: DC | PRN

## 2023-12-20 MED ORDER — ADULT MULTIVITAMIN W/MINERALS CH
1.0000 | ORAL_TABLET | Freq: Every day | ORAL | Status: DC
  Administered 2023-12-20: 1 via ORAL
  Filled 2023-12-20: qty 1

## 2023-12-20 MED ORDER — NALOXONE HCL 2 MG/2ML IJ SOSY
2.0000 mg | PREFILLED_SYRINGE | Freq: Once | INTRAMUSCULAR | Status: AC
  Administered 2023-12-20: 2 mg via INTRAVENOUS
  Filled 2023-12-20: qty 2

## 2023-12-20 MED ORDER — THIAMINE HCL 100 MG/ML IJ SOLN: 100.0000 mg | Freq: Every day | INTRAMUSCULAR | Status: DC

## 2023-12-20 MED ORDER — NALOXONE HCL 2 MG/2ML IJ SOSY
PREFILLED_SYRINGE | INTRAMUSCULAR | Status: AC
  Filled 2023-12-20: qty 2

## 2023-12-20 MED ORDER — ONDANSETRON HCL 4 MG/2ML IJ SOLN
INTRAMUSCULAR | Status: AC
  Filled 2023-12-20: qty 2

## 2023-12-20 MED ORDER — LACTATED RINGERS IV SOLN: INTRAVENOUS | Status: DC

## 2023-12-20 MED ORDER — HEPARIN SODIUM (PORCINE) 5000 UNIT/ML IJ SOLN: 5000.0000 [IU] | Freq: Three times a day (TID) | INTRAMUSCULAR | Status: DC

## 2023-12-20 NOTE — Discharge Summary (Signed)
Physician Discharge Summary  Patient ID: Gerald Mills MRN: [id number] DOB/AGE: 1981/07/29 43 y.o.  Admit date: 12/20/2023 Discharge date: 12/20/2023    Discharge Diagnoses:  Principal Problem:   Overdose                                                       D/c plan by Discharge Diagnosis  AMS r/t presumed opiate overdose requiring narcan gtt - resolved. Off narcan for >3 hours, mental and resp status stable.  P:  PCP f/u  Ongoing substance abuse counseling   HTN  P:  Resume home anti-HTN  PCP f/u  Discussed medication compliance and routine PCP f/u   CKD3  P  PCP f/u, recheck chem  Has had IV fluid resuscitation   Brief Summary: Gerald Mills is a 43 y.o. male who has a PMH as below including but not limited to HTN, polysubstance abuse, medication non-compliance. [employer] was called to his home due to bizarre behavior and with hx of drug use. He was somnolent and obtunded with EMS and had pinpoint pupils along with bizarre catatonic type movements.   He was brought to ED where symptoms persisted. He was given 4 rounds of Narcan pushes with immediate improvement and was ultimately started on a Narcan infusion.   He tells me that he took a "blue roxy" that he has had before without issue. He says that he only took one pill; however, EDP notes indicate that he informed them that he took "some pills from a friend but not quite sure what they were" . He denies any additional ingestion. Denies recreational drug use. He does drink alcohol. He denies fevers/chills/sweats, headache, lightheadedness, chest pain, dyspnea, cough, N/V/D, abd pain, myalgias.   Remained on narcan gtt for several hours. This was stopped at ~10 am and as of 130pm he is awake, alert, oriented, ambulating in the room and hallways, eating, wants to go home. Resp status stable on RA. He has required no further interventions. VSS.     Vitals:   12/20/23 1210 12/20/23 1215 12/20/23 1227 12/20/23 1228  BP: (!)  148/110     Pulse: 62   65  Resp: 15 13  13   Temp:   97.9 F (36.6 C)   TempSrc:      SpO2: 99%   94%     Discharge Labs  BMET Recent Labs  Lab 12/20/23 0355  NA 144  K 3.6  CL 104  CO2 23  GLUCOSE 129*  BUN 25*  CREATININE 2.06*  CALCIUM 9.8     CBC  Recent Labs  Lab 12/20/23 0355  HGB 13.3  HCT 41.1  WBC 13.9*  PLT 287   Anti-Coagulation No results for input(s): "INR" in the last 168 hours.          Allergies as of 12/20/2023   No Known Allergies      Medication List     STOP taking these medications    busPIRone 10 MG tablet Commonly known as: BUSPAR   hydrALAZINE 10 MG tablet Commonly known as: APRESOLINE   hydrochlorothiazide 25 MG tablet Commonly known as: HYDRODIURIL       TAKE these medications    amLODipine 10 MG tablet Commonly known as: NORVASC Take 1 tablet (10 mg total) by mouth every evening.   buPROPion  150 MG 12 hr tablet Commonly known as: Wellbutrin SR Take 1 tablet (150 mg total) by mouth daily. (Smoking cessation)   Multivitamin Adults 50+ Tabs Take 1 tablet by mouth daily.          Disposition: Discharge disposition: 01-Home or Self Care       Discharged Condition: Gerald Mills has met maximum benefit of inpatient care and is medically stable and cleared for discharge.  Patient is pending follow up as above.      Time spent on disposition:  Greater than 35 minutes.   SignedDirk Dress, NP 12/20/2023  1:35 PM Pager: [phone] or [phone]

## 2023-12-20 NOTE — ED Triage Notes (Addendum)
Pt presents from home. [employer] called by mother for bizarre behavior. Pt has sign hx of drugs per [employer]. Pt walked to stretcher but became more obtunded upon arrival. Eyes rolled in back of head, pinpoint pupils, bizarre catatonic movements. Respiratory drive low. EDP at bedside. Sats 66%. Nonrebreather applied.

## 2023-12-20 NOTE — H&P (Addendum)
NAME:  Gerald Mills, MRN:  [id number], DOB:  1981/04/15, LOS: 0 ADMISSION DATE:  12/20/2023, CONSULTATION DATE:  12/20/23 REFERRING MD:  Wilkie Aye CHIEF COMPLAINT:  Drug Overdose   History of Present Illness:  Gerald Mills is a 43 y.o. male who has a PMH as below including but not limited to HTN, polysubstance abuse, medication non-compliance. [employer] was called to his home due to bizarre behavior and with hx of drug use. He was somnolent and obtunded with EMS and had pinpoint pupils along with bizarre catatonic type movements.  He was brought to ED where symptoms persisted. He was given 4 rounds of Narcan pushes with immediate improvement and was ultimately started on a Narcan infusion.  He tells me that he took a "blue roxy" that he has had before without issue. He says that he only took one pill; however, EDP notes indicate that he informed them that he took "some pills from a friend but not quite sure what they were" . He denies any additional ingestion. Denies recreational drug use. He does drink alcohol. He denies fevers/chills/sweats, headache, lightheadedness, chest pain, dyspnea, cough, N/V/D, abd pain, myalgias.  Pertinent  Medical History:  has Hypertension; Swelling of lower extremity; Current smoker; Routine screening for STI (sexually transmitted infection); Screening for diabetes mellitus; Tinea cruris; Erectile dysfunction; History of snoring; Linea alba of oral mucosa; Pain of left calf; Retrograde ejaculation; Mood disorder (HCC); Opiate abuse, continuous (HCC); Opioid use disorder; Nausea and vomiting; Acute renal failure (HCC); Hypokalemia; Opioid use disorder, moderate, dependence (HCC); Adjustment disorder with depressed mood; Rhabdomyolysis; Polysubstance abuse (HCC); Involuntary commitment; Hypocalcemia; Acute renal failure (ARF) (HCC); and Overdose on their problem list.  Significant Hospital Events: Including procedures, antibiotic start and stop dates in addition to other  pertinent events   2/11 admit.  Interim History / Subjective:  On 3L [place] due to desaturations. Is hungry and wants to eat. 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  Objective:  Blood pressure (!) 148/99, pulse 65, temperature 98.1 F (36.7 C), temperature source Oral, resp. rate 19, SpO2 100%.       No intake or output data in the 24 hours ending 12/20/23 0618 There were no vitals filed for this visit.  Examination: General: Adult male, resting in bed, in NAD. Neuro: Sleepy but arouseable. Received Narcan 2mg  and is more arouseable per RN. Once aroused he is A&O x 3, no deficits, MAE's. HEENT: [place]/AT. Sclerae anicteric. EOMI. MM very dry. Cardiovascular: RRR, no M/R/G.  Lungs: Respirations even and unlabored.  CTA bilaterally, No W/R/R. Abdomen: BS x 4, soft, NT/ND.  Musculoskeletal: No gross deformities, no edema.  Skin: Intact, warm, no rashes.  Labs/imaging personally reviewed:  Renal US 2/11 >   Assessment & Plan:   Presumed opiate overdose - responded to Narcan pushes and ultimately started on Narcan infusion. Hx polysubstance abuse - last UDS April 2024 positive for Morphine, Benzo's, Opiates. - Continue Narcan infusion. - Supportive care. - F/u on UDS. - Substance abuse counseling.  AoCKD stage 3b - had renal US last admission April 2024 with no acute issues (medical renal disease). Also had been on hydrochlorothiazide unsure when last took. - Fluids. - Avoid nephrotoxins. - Repeat renal US. - Follow BMP.  Hx HTN with hx medication noncompliance. - Resume PTA Amlodipine, Hydralazine. - Hold PTA Hydrochlorothiazide. - Medication compliance counseling.  Hx EtOH dependence. - Ativan per CIWA protocol. - Vitamins.   Best practice (evaluated daily):  Diet/type: Regular consistency (  see orders) DVT prophylaxis: prophylactic heparin  Pressure ulcer(s): pressure ulcer assessment deferred  GI prophylaxis: N/A Lines: N/A Foley:  N/A Code Status:   full code Last date of multidisciplinary goals of care discussion: None yet.  Labs   CBC: Recent Labs  Lab 12/20/23 0355  WBC 13.9*  NEUTROABS 11.5*  HGB 13.3  HCT 41.1  MCV 93.4  PLT 287    Basic Metabolic Panel: Recent Labs  Lab 12/20/23 0355  NA 144  K 3.6  CL 104  CO2 23  GLUCOSE 129*  BUN 25*  CREATININE 2.06*  CALCIUM 9.8   GFR: CrCl cannot be calculated (Unknown ideal weight.). Recent Labs  Lab 12/20/23 0355  WBC 13.9*    Liver Function Tests: Recent Labs  Lab 12/20/23 0355  AST 43*  ALT 25  ALKPHOS 66  BILITOT 1.5*  PROT 8.1  ALBUMIN 4.9   No results for input(s): "LIPASE", "AMYLASE" in the last 168 hours. No results for input(s): "AMMONIA" in the last 168 hours.  ABG    Component Value Date/Time   TCO2 29 12/22/2015 1800     Coagulation Profile: No results for input(s): "INR", "PROTIME" in the last 168 hours.  Cardiac Enzymes: No results for input(s): "CKTOTAL", "CKMB", "CKMBINDEX", "TROPONINI" in the last 168 hours.  HbA1C: No results found for: "HGBA1C"  CBG: No results for input(s): "GLUCAP" in the last 168 hours.  Review of Systems:   All negative; except for those that are bolded, which indicate positives.  Constitutional: weight loss, weight gain, night sweats, fevers, chills, fatigue, weakness.  HEENT: headaches, sore throat, sneezing, nasal congestion, post nasal drip, difficulty swallowing, tooth/dental problems, visual complaints, visual changes, ear aches. Neuro: difficulty with speech, weakness, numbness, ataxia. CV:  chest pain, orthopnea, PND, swelling in lower extremities, dizziness, palpitations, syncope.  Resp: cough, hemoptysis, dyspnea, wheezing. GI: heartburn, indigestion, abdominal pain, nausea, vomiting, diarrhea, constipation, change in bowel habits, loss of appetite, hematemesis, melena, hematochezia.  GU: dysuria, change in color of urine, urgency or frequency, flank pain, hematuria. MSK: joint pain or  swelling, decreased range of motion. Psych: change in mood or affect, depression, anxiety, suicidal ideations, homicidal ideations. Skin: rash, itching, bruising.   Past Medical History:  He,  has a past medical history of Hypertension.   Surgical History:  History reviewed. No pertinent surgical history.   Social History:   reports that he has been smoking cigarettes. He has a 12 pack-year smoking history. He has never used smokeless tobacco. He reports current alcohol use. He reports that he does not use drugs.   Family History:  His family history includes Diabetes in his mother; Healthy in his father; Hypertension in his father and mother.   Allergies No Known Allergies   Home Medications  Prior to Admission medications   Medication Sig Start Date End Date Taking? Authorizing Provider  amLODipine (NORVASC) 10 MG tablet Take 1 tablet (10 mg total) by mouth every evening. 03/05/23 04/08/23  Meredeth Ide, MD  benzonatate (TESSALON) 200 MG capsule Take 1 capsule (200 mg total) by mouth 3 (three) times daily as needed for cough. 06/21/23   Rodriguez-Southworth, Nettie Elm, PA-C  buPROPion (WELLBUTRIN SR) 150 MG 12 hr tablet Take 1 tablet (150 mg total) by mouth daily. (Smoking cessation) 03/24/23   Crain, Whitney L, PA  busPIRone (BUSPAR) 10 MG tablet Start 1/2 tab once daily x 4 days, take 1/2 tab twice daily x 4 days, then increase to 1 tab in the AM and 1/2  tab in the PM, then 1 tab PO BID (BP, stress, tension) 03/24/23   Crain, Whitney L, PA  hydrALAZINE (APRESOLINE) 10 MG tablet Take 1 tablet (10 mg total) by mouth 2 (two) times daily. 03/05/23   Meredeth Ide, MD  hydrochlorothiazide (HYDRODIURIL) 25 MG tablet Take 1 tablet (25 mg total) by mouth every morning. 03/05/23   Meredeth Ide, MD  trimethoprim-polymyxin b (POLYTRIM) ophthalmic solution Place 1 drop into both eyes in the morning, at noon, and at bedtime. 06/21/23   Rodriguez-Southworth, Nettie Elm, PA-C  furosemide (LASIX) 20 MG tablet  Take 1 tablet (20 mg total) by mouth daily for 7 days. 09/25/20 03/03/21  Bing Neighbors, NP  lisinopril-hydrochlorothiazide (ZESTORETIC) 10-12.5 MG tablet Take 1 tablet by mouth daily. 05/21/19 08/25/19  Mickie Bail, NP     Critical care time: 30 min.   Rutherford Guys, PA - C [facility] For pager details, please see AMION or use Epic chat  After 1900, please call [facility] for cross coverage needs 12/20/2023, 6:18 AM

## 2023-12-20 NOTE — ED Provider Notes (Signed)
[facility] AT [facility] Provider Note   CSN: [id number] Arrival date & time: 12/20/23  0346     History  Chief Complaint  Patient presents with   Drug Overdose    Gerald Mills is a 43 y.o. male.  The history is provided by the patient and the EMS personnel.  Drug Overdose   43 year old male with history of adjustment disorder, hypertension, mood disorder, opioid use disorder and dependence, presenting to the ED via EMS for possible drug use.  Mother called [employer] as patient was acting bizarre.  Apparently he has been using drugs heavily over the past year so she has kicked him out of the home.  Upon EMS arrival, patient was able to walk to stretcher but became more somnolent after arrival in the ED.  He states he took some pills from a friend that he is not quite sure what they were.    On arrival patient with low sats in the 60's.  He was given narcan with immediate response.  Home Medications Prior to Admission medications   Medication Sig Start Date End Date Taking? Authorizing Provider  amLODipine (NORVASC) 10 MG tablet Take 1 tablet (10 mg total) by mouth every evening. 03/05/23 04/08/23  Meredeth Ide, MD  benzonatate (TESSALON) 200 MG capsule Take 1 capsule (200 mg total) by mouth 3 (three) times daily as needed for cough. 06/21/23   Rodriguez-Southworth, Nettie Elm, PA-C  buPROPion (WELLBUTRIN SR) 150 MG 12 hr tablet Take 1 tablet (150 mg total) by mouth daily. (Smoking cessation) 03/24/23   Crain, Whitney L, PA  busPIRone (BUSPAR) 10 MG tablet Start 1/2 tab once daily x 4 days, take 1/2 tab twice daily x 4 days, then increase to 1 tab in the AM and 1/2 tab in the PM, then 1 tab PO BID (BP, stress, tension) 03/24/23   Crain, Whitney L, PA  hydrALAZINE (APRESOLINE) 10 MG tablet Take 1 tablet (10 mg total) by mouth 2 (two) times daily. 03/05/23   Meredeth Ide, MD  hydrochlorothiazide (HYDRODIURIL) 25 MG tablet Take 1 tablet (25 mg total) by mouth every  morning. 03/05/23   Meredeth Ide, MD  trimethoprim-polymyxin b (POLYTRIM) ophthalmic solution Place 1 drop into both eyes in the morning, at noon, and at bedtime. 06/21/23   Rodriguez-Southworth, Nettie Elm, PA-C  furosemide (LASIX) 20 MG tablet Take 1 tablet (20 mg total) by mouth daily for 7 days. 09/25/20 03/03/21  Bing Neighbors, NP  lisinopril-hydrochlorothiazide (ZESTORETIC) 10-12.5 MG tablet Take 1 tablet by mouth daily. 05/21/19 08/25/19  Mickie Bail, NP      Allergies    Patient has no known allergies.    Review of Systems   Review of Systems  Psychiatric/Behavioral:         Drug use  All other systems reviewed and are negative.   Physical Exam Updated Vital Signs BP (!) 163/105   Pulse 64   Temp 98.1 F (36.7 C) (Oral)   Resp 13   SpO2 100%   Physical Exam Vitals and nursing note reviewed.  Constitutional:      Appearance: He is well-developed.     Comments: Very somnolent  HENT:     Head: Normocephalic and atraumatic.  Eyes:     Conjunctiva/sclera: Conjunctivae normal.     Pupils: Pupils are equal, round, and reactive to light.     Comments: Pupils pinpoint  Cardiovascular:     Rate and Rhythm: Normal rate and regular rhythm.  Heart sounds: Normal heart sounds.  Pulmonary:     Comments: Shallow respirations, sats 66% and placed on NRB Abdominal:     General: Bowel sounds are normal.     Palpations: Abdomen is soft.  Musculoskeletal:        General: Normal range of motion.     Cervical back: Normal range of motion.  Skin:    General: Skin is warm and dry.  Neurological:     Comments: Somnolent, not following commands  Psychiatric:     Comments: Unable to assess     ED Results / Procedures / Treatments   Labs (all labs ordered are listed, but only abnormal results are displayed) Labs Reviewed  CBC WITH DIFFERENTIAL/PLATELET - Abnormal; Notable for the following components:      Result Value   WBC 13.9 (*)    Neutro Abs 11.5 (*)    All other  components within normal limits  COMPREHENSIVE METABOLIC PANEL - Abnormal; Notable for the following components:   Glucose, Bld 129 (*)    BUN 25 (*)    Creatinine, Ser 2.06 (*)    AST 43 (*)    Total Bilirubin 1.5 (*)    GFR, Estimated 40 (*)    Anion gap 17 (*)    All other components within normal limits  ETHANOL  RAPID URINE DRUG SCREEN, HOSP PERFORMED    EKG None  Radiology No results found.  Procedures Procedures    CRITICAL CARE Performed by: Garlon Hatchet   Total critical care time: 45  minutes  Critical care time was exclusive of separately billable procedures and treating other patients.  Critical care was necessary to treat or prevent imminent or life-threatening deterioration.  Critical care was time spent personally by me on the following activities: development of treatment plan with patient and/or surrogate as well as nursing, discussions with consultants, evaluation of patient's response to treatment, examination of patient, obtaining history from patient or surrogate, ordering and performing treatments and interventions, ordering and review of laboratory studies, ordering and review of radiographic studies, pulse oximetry and re-evaluation of patient's condition.   Medications Ordered in ED Medications  naloxone HCl (NARCAN) 4 mg in dextrose 5 % 250 mL infusion (has no administration in time range)  ammonia inhalant (  Given 12/20/23 0401)  naloxone [facility]) 2 MG/2ML injection (  Given 12/20/23 0401)  naloxone [facility]) 2 MG/2ML injection (  Given 12/20/23 0455)  ondansetron (ZOFRAN) 4 MG/2ML injection (  Given 12/20/23 0401)    ED Course/ Medical Decision Making/ A&P                                 Medical Decision Making Amount and/or Complexity of Data Reviewed Labs: ordered. ECG/medicine tests: ordered and independent interpretation performed.  Risk Prescription drug management. Decision regarding hospitalization.   43 year old male brought  in by EMS for suspected drug overdose.  Pinpoint pupils, shallow respirations, and hypoxia noted on arrival.  Given IV narcan and almost immediate response.  Admits to taking a "pill" from a friend.  Does have history of polysubstance abuse.  Labs pending.  Will monitor closely.  4:52 AM Patient somnolent once again, desaturating down to 82%.  He does arouse with loud stimuli.  Given additional narcan.  5:36 AM Patient somnolent and desaturating again.  3rd dose of narcan within <2 hours.  Starting narcan drip.  He will need admission to ICU for  close monitoring.  Discussed with Dr. Lonzo Candy-- will admit for ongoing care.  Final Clinical Impression(s) / ED Diagnoses Final diagnoses:  Opiate overdose, undetermined intent, initial encounter [facility])    Rx / DC Orders ED Discharge Orders     None         Garlon Hatchet, PA-C 12/20/23 Ulis Rias    Shon Baton, MD 12/23/23 [id number]

## 2023-12-20 NOTE — ED Notes (Signed)
Pt currently calling a ride to pick him up

## 2024-02-07 DEATH — deceased

## 2024-02-23 ENCOUNTER — Other Ambulatory Visit (HOSPITAL_COMMUNITY): Payer: Self-pay
